# Patient Record
Sex: Male | Born: 1935 | Race: Black or African American | Hispanic: No | Marital: Married | Smoking: Former smoker
Health system: Southern US, Community
[De-identification: ages and names within clinical notes are randomized; demographics above are authoritative.]

## PROBLEM LIST (undated history)

## (undated) DIAGNOSIS — C259 Malignant neoplasm of pancreas, unspecified: Secondary | ICD-10-CM

## (undated) DIAGNOSIS — I10 Essential (primary) hypertension: Secondary | ICD-10-CM

## (undated) DIAGNOSIS — R6883 Chills (without fever): Secondary | ICD-10-CM

## (undated) DIAGNOSIS — A630 Anogenital (venereal) warts: Secondary | ICD-10-CM

## (undated) DIAGNOSIS — I429 Cardiomyopathy, unspecified: Secondary | ICD-10-CM

## (undated) DIAGNOSIS — I82409 Acute embolism and thrombosis of unspecified deep veins of unspecified lower extremity: Secondary | ICD-10-CM

## (undated) DIAGNOSIS — D689 Coagulation defect, unspecified: Secondary | ICD-10-CM

## (undated) DIAGNOSIS — D045 Carcinoma in situ of skin of trunk: Secondary | ICD-10-CM

## (undated) DIAGNOSIS — I441 Atrioventricular block, second degree: Secondary | ICD-10-CM

## (undated) DIAGNOSIS — M7989 Other specified soft tissue disorders: Secondary | ICD-10-CM

## (undated) DIAGNOSIS — IMO0002 Reserved for concepts with insufficient information to code with codable children: Secondary | ICD-10-CM

## (undated) HISTORY — DX: Essential (primary) hypertension: I10

## (undated) HISTORY — DX: Chills (without fever): R68.83

## (undated) HISTORY — PX: HERNIA REPAIR: SHX51

## (undated) HISTORY — DX: Atrioventricular block, second degree: I44.1

## (undated) HISTORY — DX: Cardiomyopathy, unspecified: I42.9

## (undated) HISTORY — DX: Anogenital (venereal) warts: A63.0

## (undated) HISTORY — DX: Reserved for concepts with insufficient information to code with codable children: IMO0002

## (undated) HISTORY — DX: Carcinoma in situ of skin of trunk: D04.5

## (undated) HISTORY — DX: Coagulation defect, unspecified: D68.9

## (undated) HISTORY — DX: Acute embolism and thrombosis of unspecified deep veins of unspecified lower extremity: I82.409

## (undated) HISTORY — DX: Other specified soft tissue disorders: M79.89

## (undated) HISTORY — DX: Malignant neoplasm of pancreas, unspecified: C25.9

## (undated) HISTORY — PX: LAPAROSCOPIC GASTROTOMY W/ REPAIR OF ULCER: SUR772

---

## 2001-01-12 ENCOUNTER — Encounter: Payer: Self-pay | Admitting: Endocrinology

## 2001-01-12 ENCOUNTER — Ambulatory Visit (HOSPITAL_COMMUNITY): Admission: RE | Admit: 2001-01-12 | Discharge: 2001-01-12 | Payer: Self-pay | Admitting: Endocrinology

## 2001-01-15 ENCOUNTER — Encounter: Admission: RE | Admit: 2001-01-15 | Discharge: 2001-04-15 | Payer: Self-pay | Admitting: Endocrinology

## 2001-02-27 ENCOUNTER — Inpatient Hospital Stay (HOSPITAL_COMMUNITY): Admission: EM | Admit: 2001-02-27 | Discharge: 2001-03-03 | Payer: Self-pay | Admitting: Emergency Medicine

## 2001-02-27 ENCOUNTER — Encounter: Payer: Self-pay | Admitting: *Deleted

## 2001-02-27 ENCOUNTER — Encounter: Payer: Self-pay | Admitting: Internal Medicine

## 2001-02-28 ENCOUNTER — Encounter: Payer: Self-pay | Admitting: Endocrinology

## 2001-04-10 ENCOUNTER — Observation Stay (HOSPITAL_COMMUNITY): Admission: RE | Admit: 2001-04-10 | Discharge: 2001-04-11 | Payer: Self-pay | Admitting: *Deleted

## 2001-09-22 ENCOUNTER — Inpatient Hospital Stay (HOSPITAL_COMMUNITY): Admission: EM | Admit: 2001-09-22 | Discharge: 2001-10-02 | Payer: Self-pay | Admitting: Emergency Medicine

## 2001-09-22 ENCOUNTER — Encounter: Payer: Self-pay | Admitting: Emergency Medicine

## 2001-09-29 ENCOUNTER — Encounter: Payer: Self-pay | Admitting: Surgery

## 2004-02-02 ENCOUNTER — Ambulatory Visit (HOSPITAL_COMMUNITY): Admission: RE | Admit: 2004-02-02 | Discharge: 2004-02-02 | Payer: Self-pay | Admitting: Endocrinology

## 2005-01-24 ENCOUNTER — Ambulatory Visit (HOSPITAL_COMMUNITY): Admission: RE | Admit: 2005-01-24 | Discharge: 2005-01-24 | Payer: Self-pay | Admitting: Endocrinology

## 2006-01-25 ENCOUNTER — Encounter: Admission: RE | Admit: 2006-01-25 | Discharge: 2006-01-25 | Payer: Self-pay | Admitting: Endocrinology

## 2006-02-08 ENCOUNTER — Ambulatory Visit (HOSPITAL_COMMUNITY): Admission: RE | Admit: 2006-02-08 | Discharge: 2006-02-08 | Payer: Self-pay | Admitting: *Deleted

## 2006-02-08 ENCOUNTER — Encounter (INDEPENDENT_AMBULATORY_CARE_PROVIDER_SITE_OTHER): Payer: Self-pay | Admitting: *Deleted

## 2006-02-10 ENCOUNTER — Encounter (INDEPENDENT_AMBULATORY_CARE_PROVIDER_SITE_OTHER): Payer: Self-pay | Admitting: *Deleted

## 2006-02-10 ENCOUNTER — Ambulatory Visit (HOSPITAL_COMMUNITY): Admission: RE | Admit: 2006-02-10 | Discharge: 2006-02-10 | Payer: Self-pay | Admitting: Endocrinology

## 2006-03-07 ENCOUNTER — Encounter: Admission: RE | Admit: 2006-03-07 | Discharge: 2006-03-07 | Payer: Self-pay

## 2008-01-29 ENCOUNTER — Encounter: Admission: RE | Admit: 2008-01-29 | Discharge: 2008-01-29 | Payer: Self-pay | Admitting: Endocrinology

## 2009-01-28 ENCOUNTER — Encounter: Admission: RE | Admit: 2009-01-28 | Discharge: 2009-01-28 | Payer: Self-pay | Admitting: Endocrinology

## 2009-02-19 ENCOUNTER — Ambulatory Visit (HOSPITAL_COMMUNITY): Admission: RE | Admit: 2009-02-19 | Discharge: 2009-02-19 | Payer: Self-pay | Admitting: *Deleted

## 2009-02-19 ENCOUNTER — Encounter (INDEPENDENT_AMBULATORY_CARE_PROVIDER_SITE_OTHER): Payer: Self-pay | Admitting: *Deleted

## 2009-05-06 ENCOUNTER — Encounter: Admission: RE | Admit: 2009-05-06 | Discharge: 2009-05-06 | Payer: Self-pay | Admitting: Orthopedic Surgery

## 2009-06-02 ENCOUNTER — Encounter: Admission: RE | Admit: 2009-06-02 | Discharge: 2009-06-02 | Payer: Self-pay | Admitting: Endocrinology

## 2009-06-23 HISTORY — PX: BACK SURGERY: SHX140

## 2009-07-01 ENCOUNTER — Inpatient Hospital Stay (HOSPITAL_COMMUNITY): Admission: RE | Admit: 2009-07-01 | Discharge: 2009-07-13 | Payer: Self-pay | Admitting: Orthopedic Surgery

## 2009-07-01 ENCOUNTER — Ambulatory Visit: Payer: Self-pay | Admitting: Pulmonary Disease

## 2009-07-06 ENCOUNTER — Encounter (INDEPENDENT_AMBULATORY_CARE_PROVIDER_SITE_OTHER): Payer: Self-pay | Admitting: Orthopedic Surgery

## 2009-07-06 ENCOUNTER — Ambulatory Visit: Payer: Self-pay | Admitting: Vascular Surgery

## 2009-07-11 ENCOUNTER — Ambulatory Visit: Payer: Self-pay | Admitting: Physical Medicine & Rehabilitation

## 2009-07-21 ENCOUNTER — Inpatient Hospital Stay (HOSPITAL_COMMUNITY): Admission: EM | Admit: 2009-07-21 | Discharge: 2009-07-23 | Payer: Self-pay | Admitting: Emergency Medicine

## 2010-01-06 ENCOUNTER — Encounter: Admission: RE | Admit: 2010-01-06 | Discharge: 2010-02-26 | Payer: Self-pay | Admitting: Rheumatology

## 2010-08-20 IMAGING — US US SOFT TISSUE HEAD/NECK
1 series · 14 of 21 positions shown · non-contrast
Comparison: 01/29/2008

CLINICAL DATA: Follow-up thyroid nodule.

THYROID ULTRASOUND
TECHNIQUE: Ultrasound examination of the thyroid gland and
adjacent soft tissues was performed.

[Series 1: us soft tissue head/neck · 0.08mm/px · 14 of 21 slices shown]
[im 1/21]
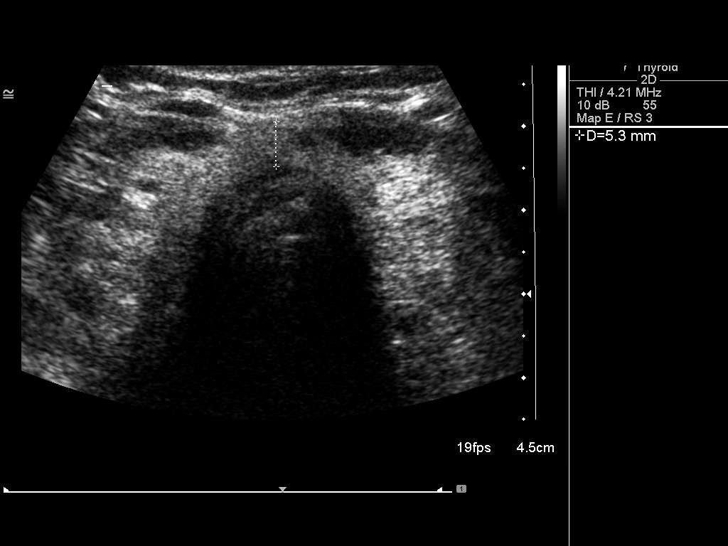
[im 3/21]
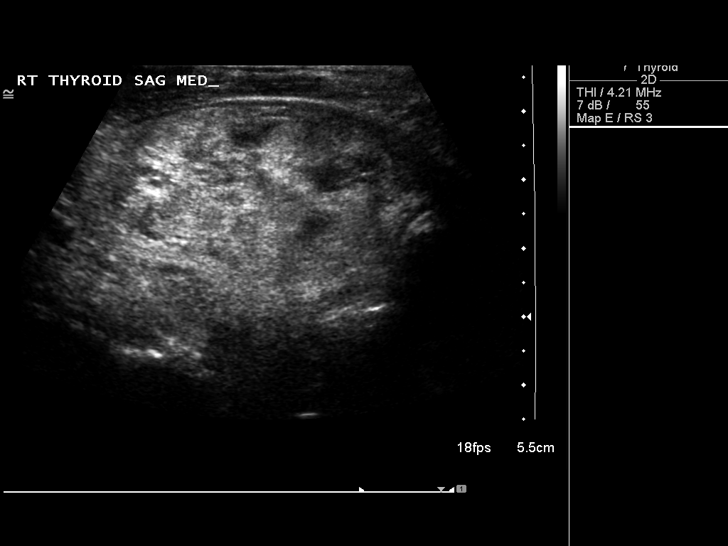
[im 4/21]
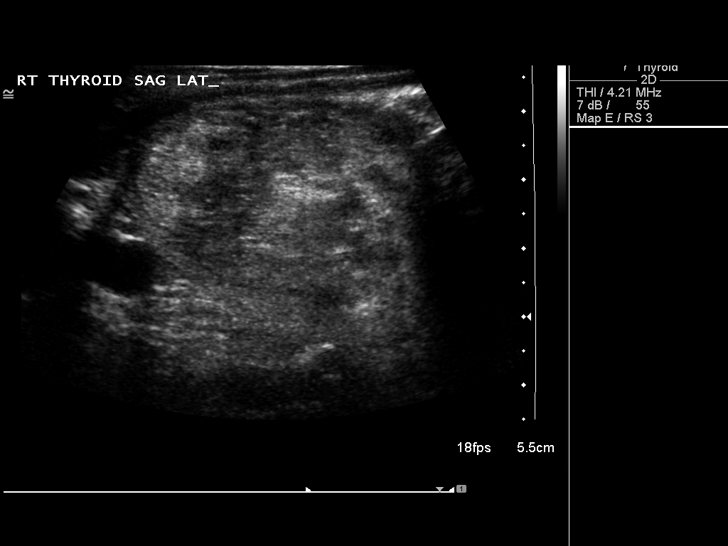
[im 6/21]
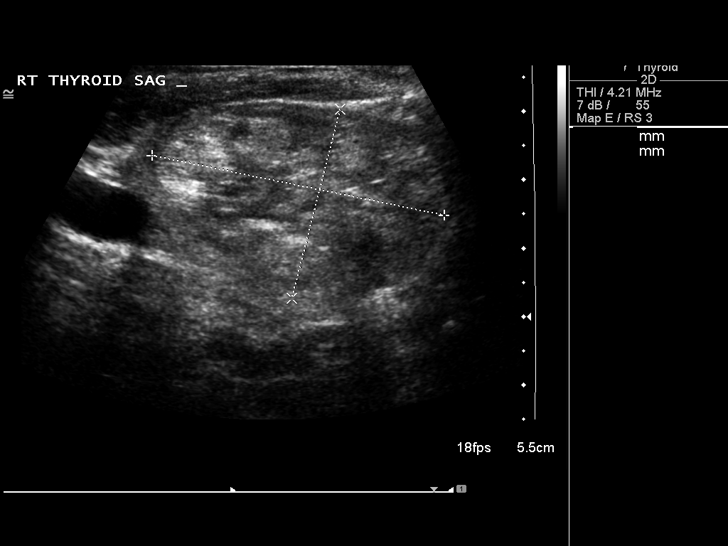
[im 7/21]
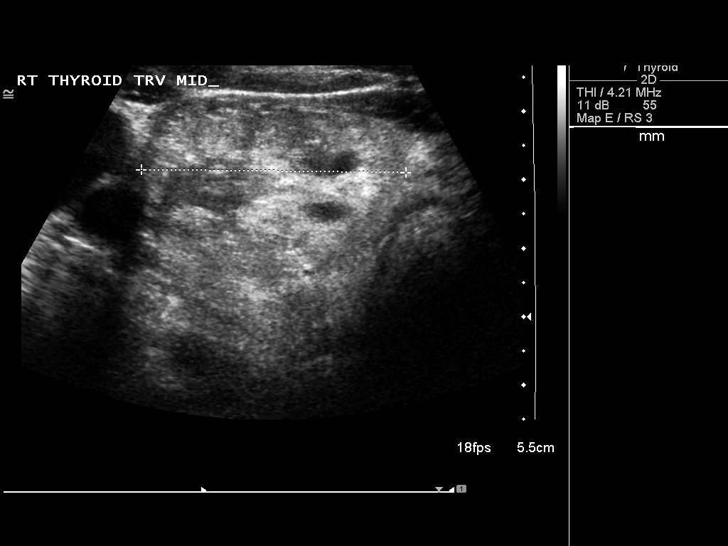
[im 9/21]
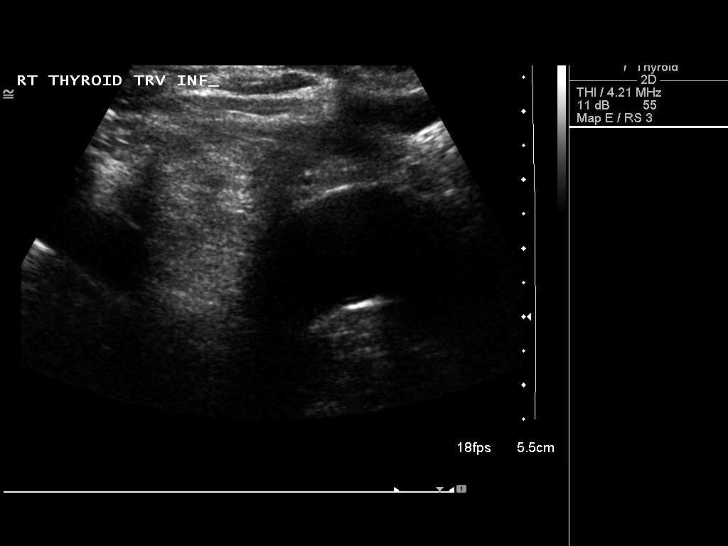
[im 10/21]
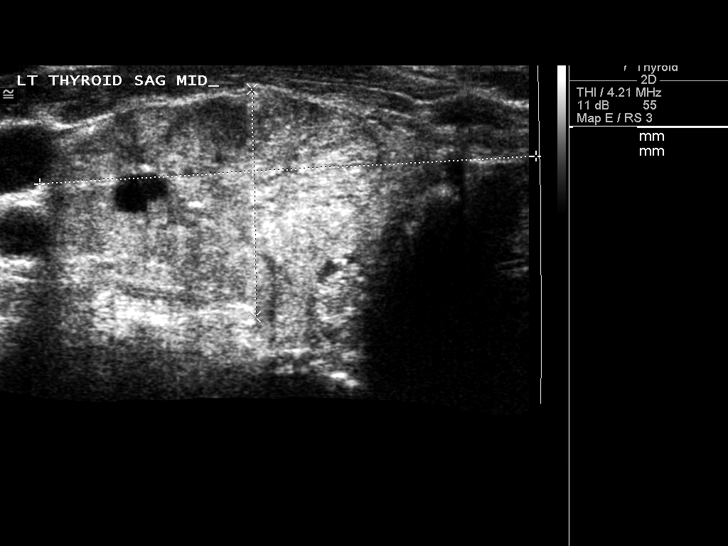
[im 12/21]
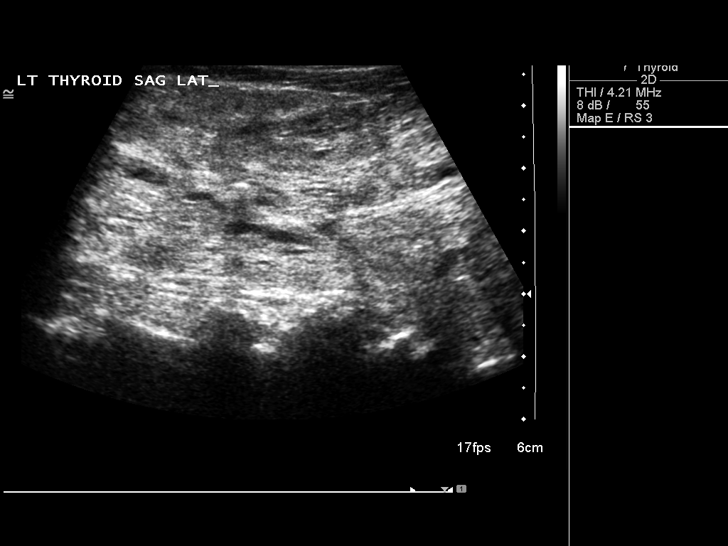
[im 13/21]
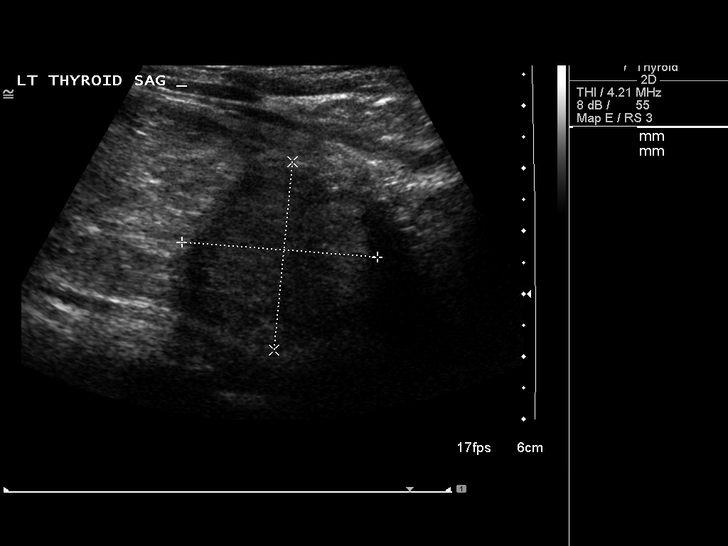
[im 15/21]
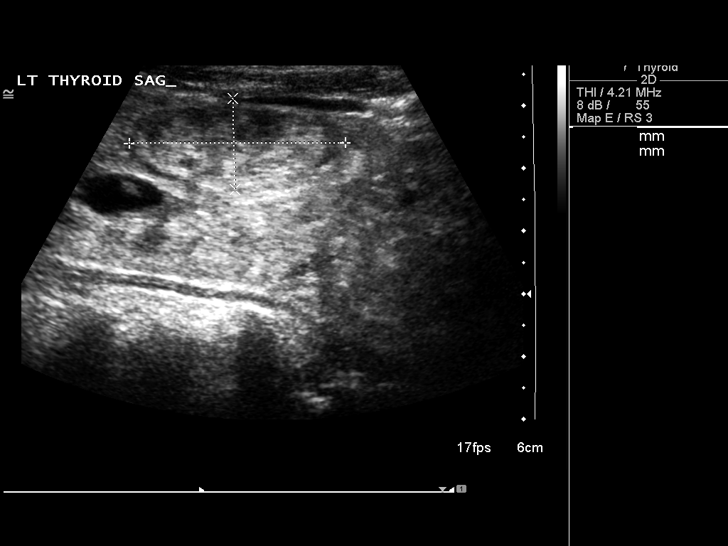
[im 16/21]
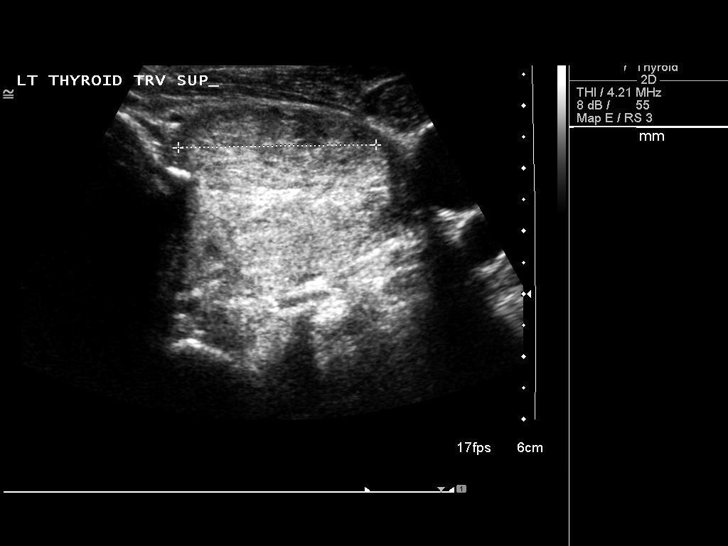
[im 18/21]
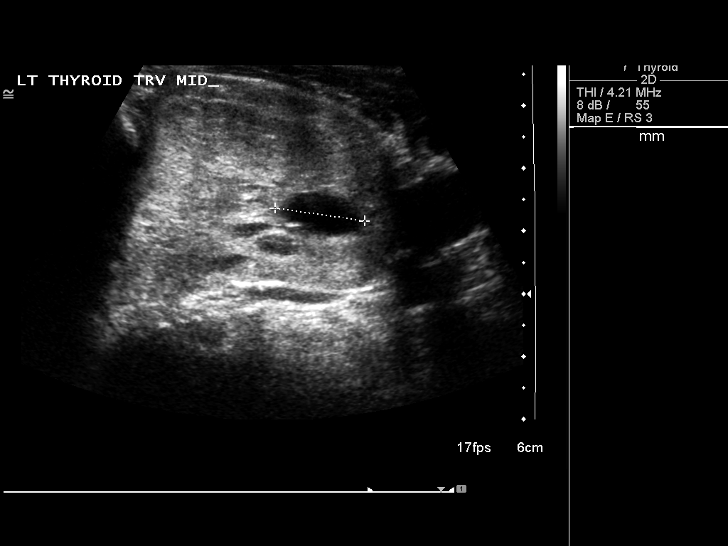
[im 19/21]
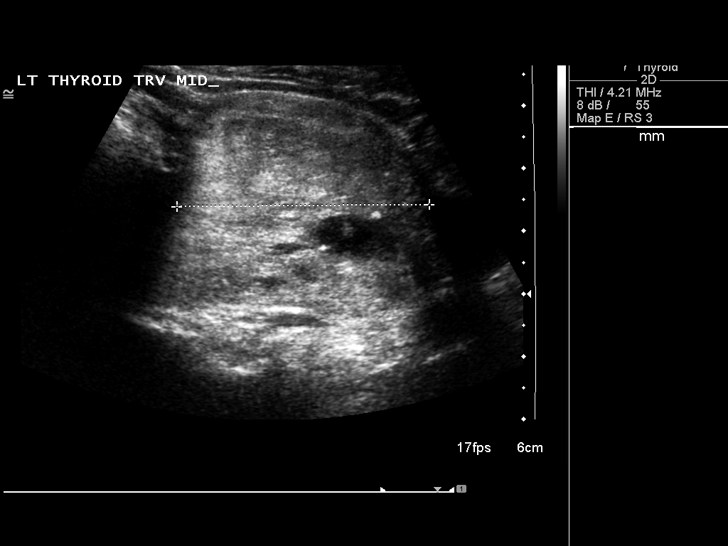
[im 21/21]
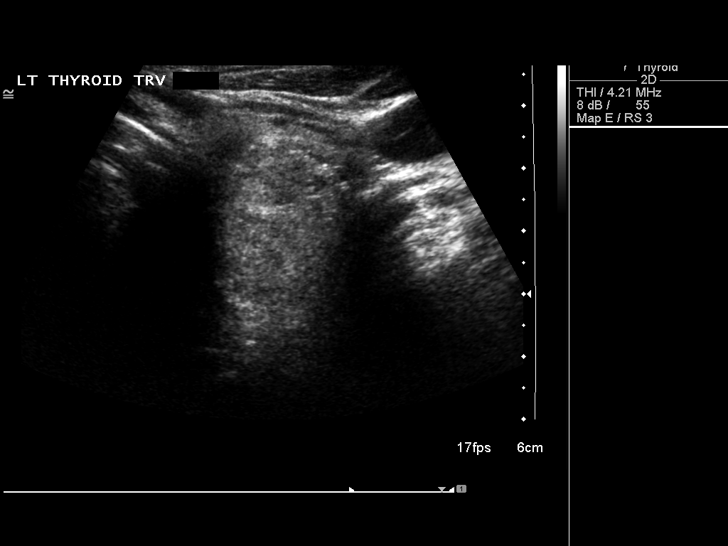

[14 of 21 positions shown; findings below may reference images not displayed]

FINDINGS: Right thyroid lobe measures 6.2 of 4.0 x 3.9 cm.  Left
thyroid lobe measures 7.6 x 3.5 x 4.0 cm.  These dimensions are
smaller than on prior study.  Isthmus is 5 mm.

Dominant solitary solid nodule seen on the right, measuring 4.4 x
2.9 x 3.9 cm, stable or slightly smaller in size.  Three nodules
noted on the left. Upper pole nodule measures 3.5 cm maximally,
compared 4 cm previously.  Mid pole cystic nodule measures 1.5 cm
maximally, compared to 1.5 cm previously.  Lower pole solid nodule
measures 3.1 cm maximally, compared to 3.3 cm previously.
IMPRESSION: The overall gland size has decreased since prior study.  Bilateral
thyroid nodules are stable or slightly smaller since prior study.

## 2010-10-28 ENCOUNTER — Observation Stay (HOSPITAL_COMMUNITY)
Admission: EM | Admit: 2010-10-28 | Discharge: 2010-10-29 | Payer: Self-pay | Source: Home / Self Care | Attending: Internal Medicine | Admitting: Internal Medicine

## 2011-01-21 IMAGING — CR DG LUMBAR SPINE 2-3V
1 series · 1 of 1 positions shown · non-contrast
Comparison: 06/26/2009 and earlier.

CLINICAL DATA: 85-year-old male undergoing lumbar surgery.

LUMBAR SPINE - 2-3 VIEW

[view not recorded]
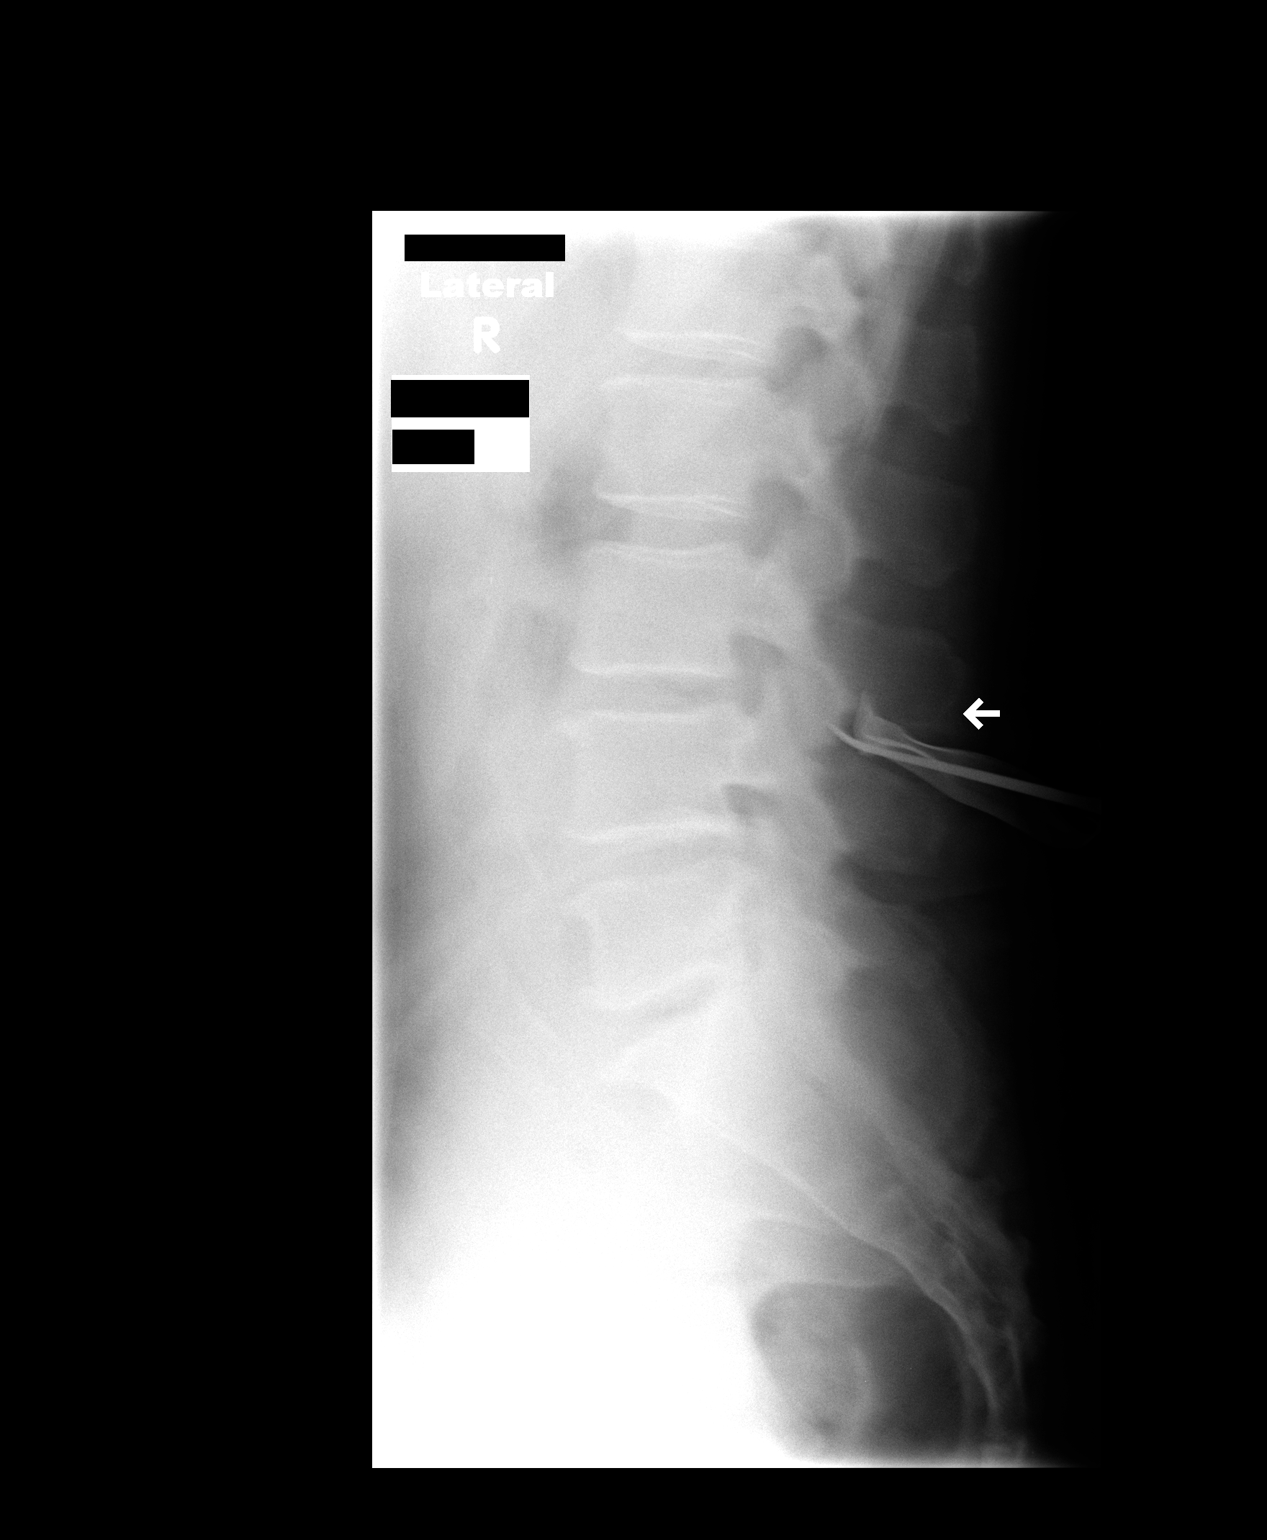

[1 of 1 positions shown; findings below may reference images not displayed]

FINDINGS: Portable cross-table lateral intraoperative view of the
lumbar spine labeled 4944 hours and 1014 hours on 07/01/2009.

On the film labeled 4944 hours there is a surgical probe at the L3-
L4 disc space level, projecting over the L3 inferior articulating
facet.

On the film labeled 1014 hours there is a surgical probe again
adjacent to the L3 inferior articulating facet near the L4 pedicle
level.  Surgical clamps are present in the interspinous space at L2-
L3 and L4-L5.
IMPRESSION: Intraoperative localization as above.

## 2011-01-24 LAB — CK TOTAL AND CKMB (NOT AT ARMC)
CK, MB: 1.9 ng/mL (ref 0.3–4.0)
Relative Index: INVALID (ref 0.0–2.5)
Total CK: 94 U/L (ref 7–232)

## 2011-01-24 LAB — BASIC METABOLIC PANEL
BUN: 20 mg/dL (ref 6–23)
CO2: 28 mEq/L (ref 19–32)
Calcium: 9 mg/dL (ref 8.4–10.5)
Chloride: 109 mEq/L (ref 96–112)
Creatinine, Ser: 1.03 mg/dL (ref 0.4–1.5)
GFR calc Af Amer: >60 mL/min (ref >60)
GFR calc non Af Amer: >60 mL/min (ref >60)
Glucose, Bld: 93 mg/dL (ref 70–99)
Potassium: 4.2 mEq/L (ref 3.5–5.1)
Sodium: 143 mEq/L (ref 135–145)

## 2011-01-24 LAB — CBC
HCT: 37.7 % — ABNORMAL LOW (ref 39.0–52.0)
Hemoglobin: 13.2 g/dL (ref 13.0–17.0)
MCH: 30.7 pg (ref 26.0–34.0)
MCHC: 35 g/dL (ref 30.0–36.0)
MCV: 87.7 fL (ref 78.0–100.0)
Platelets: 153 10*3/uL (ref 150–400)
RBC: 4.3 MIL/uL (ref 4.22–5.81)
RDW: 15.6 % — ABNORMAL HIGH (ref 11.5–15.5)
WBC: 7.3 10*3/uL (ref 4.0–10.5)

## 2011-01-24 LAB — LUPUS ANTICOAGULANT PANEL
DRVVT: 37 secs (ref 36.2–44.3)
Lupus Anticoagulant: NOT DETECTED
PTT Lupus Anticoagulant: 36.4 secs (ref 30.0–45.6)

## 2011-01-24 LAB — DIFFERENTIAL
Basophils Absolute: 0 10*3/uL (ref 0.0–0.1)
Basophils Relative: 0 % (ref 0–1)
Eosinophils Absolute: 0.1 10*3/uL (ref 0.0–0.7)
Eosinophils Relative: 1 % (ref 0–5)
Lymphocytes Relative: 26 % (ref 12–46)
Lymphs Abs: 1.9 10*3/uL (ref 0.7–4.0)
Monocytes Absolute: 0.9 10*3/uL (ref 0.1–1.0)
Monocytes Relative: 12 % (ref 3–12)
Neutro Abs: 4.4 10*3/uL (ref 1.7–7.7)
Neutrophils Relative %: 61 % (ref 43–77)

## 2011-01-24 LAB — BETA-2-GLYCOPROTEIN I ABS, IGG/M/A
Beta-2 Glyco I IgG: 0 G Units (ref <20)
Beta-2-Glycoprotein I IgA: 7 A Units (ref <20)
Beta-2-Glycoprotein I IgM: 5 M Units (ref <20)

## 2011-01-24 LAB — CARDIAC PANEL(CRET KIN+CKTOT+MB+TROPI)
CK, MB: 1.5 ng/mL (ref 0.3–4.0)
CK, MB: 1.6 ng/mL (ref 0.3–4.0)
Relative Index: INVALID (ref 0.0–2.5)
Relative Index: INVALID (ref 0.0–2.5)
Total CK: 72 U/L (ref 7–232)
Total CK: 77 U/L (ref 7–232)
Troponin I: 0.01 ng/mL (ref 0.00–0.06)
Troponin I: 0.01 ng/mL (ref 0.00–0.06)

## 2011-01-24 LAB — CARDIOLIPIN ANTIBODIES, IGG, IGM, IGA
Anticardiolipin IgA: 3 APL U/mL — ABNORMAL LOW (ref <22)
Anticardiolipin IgG: 2 GPL U/mL — ABNORMAL LOW (ref <23)
Anticardiolipin IgM: 3 MPL U/mL — ABNORMAL LOW (ref <11)

## 2011-01-24 LAB — PROTIME-INR
INR: 1.09 (ref 0.00–1.49)
INR: 1.14 (ref 0.00–1.49)
Prothrombin Time: 14.3 seconds (ref 11.6–15.2)
Prothrombin Time: 14.8 seconds (ref 11.6–15.2)

## 2011-01-24 LAB — PROTEIN C ACTIVITY: Protein C Activity: 113 % (ref 75–133)

## 2011-01-24 LAB — D-DIMER, QUANTITATIVE: D-Dimer, Quant: 2.31 ug/mL-FEU — ABNORMAL HIGH (ref 0.00–0.48)

## 2011-01-24 LAB — PROTEIN S ACTIVITY: Protein S Activity: 52 % — ABNORMAL LOW (ref 69–129)

## 2011-01-24 LAB — HOMOCYSTEINE: Homocysteine: 10.6 umol/L (ref 4.0–15.4)

## 2011-01-24 LAB — PROTEIN C, TOTAL: Protein C, Total: 103 % (ref 70–140)

## 2011-01-24 LAB — ANTITHROMBIN III: AntiThromb III Func: 77 % (ref 76–126)

## 2011-01-24 LAB — TROPONIN I: Troponin I: 0.02 ng/mL (ref 0.00–0.06)

## 2011-01-24 LAB — PROTEIN S, TOTAL: Protein S Ag, Total: 80 % (ref 70–140)

## 2011-01-24 LAB — FACTOR 5 LEIDEN

## 2011-01-24 LAB — PROTHROMBIN GENE MUTATION

## 2011-01-24 LAB — TSH: TSH: 0.064 u[IU]/mL — ABNORMAL LOW (ref 0.350–4.500)

## 2011-01-24 IMAGING — CR DG CHEST 2V
1 series · 1 of 1 positions shown · non-contrast
Comparison: 07/01/2009 and earlier.

CLINICAL DATA: 85-year-old male with spinal stenosis and cough.
Question pleural effusion.

CHEST - 2 VIEW

[view not recorded]
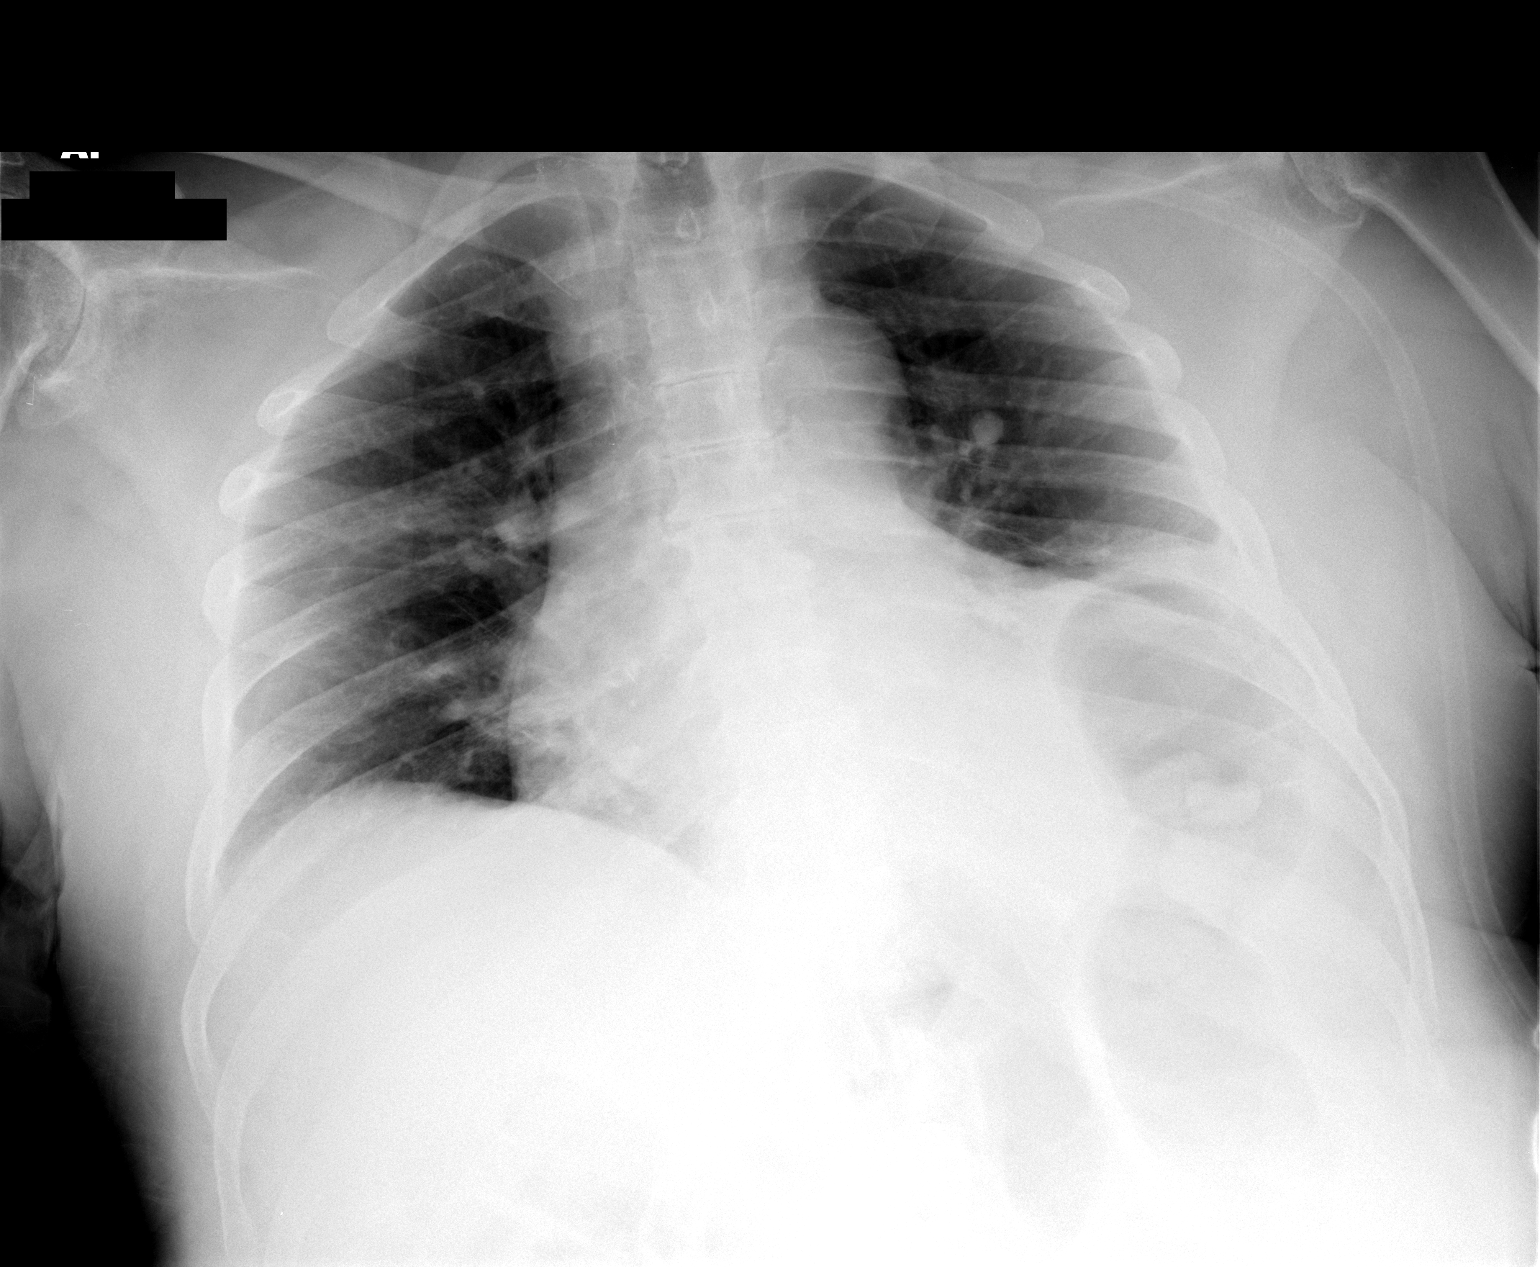

[1 of 1 positions shown; findings below may reference images not displayed]

FINDINGS: Semi upright AP and lateral views of the chest.  Total
collapse of the lingula.  Elevation of left hemidiaphragm.  No
definite left pleural effusion.  Overall low lung volumes with
atelectasis also at the right base. Stable cardiomegaly and
mediastinal contours.  No pneumothorax or pulmonary edema.
IMPRESSION: No strong evidence of pleural effusion.  Left lingula collapse and
lesser superimposed bibasilar atelectasis.

## 2011-01-24 IMAGING — US US RENAL
1 series · 14 of 25 positions shown · non-contrast
Comparison: Abdominal ultrasound performed 06/02/2009

CLINICAL DATA: Rule out hydronephrosis

RENAL/URINARY TRACT ULTRASOUND COMPLETE

[Series 1: us renal · 0.35mm/px · 14 of 36 slices shown]
[im 1/36]
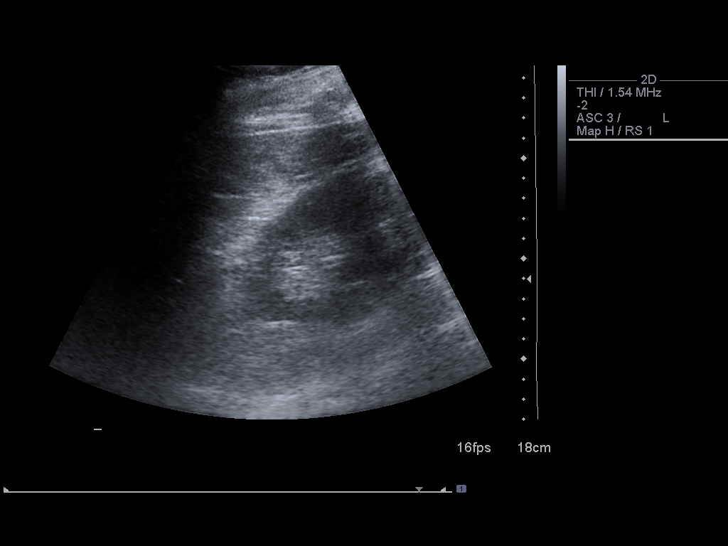
[im 3/36]
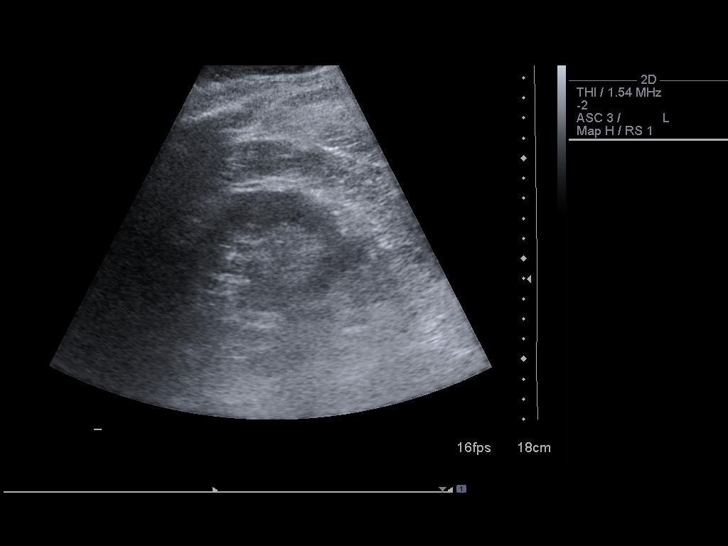
[im 6/36]
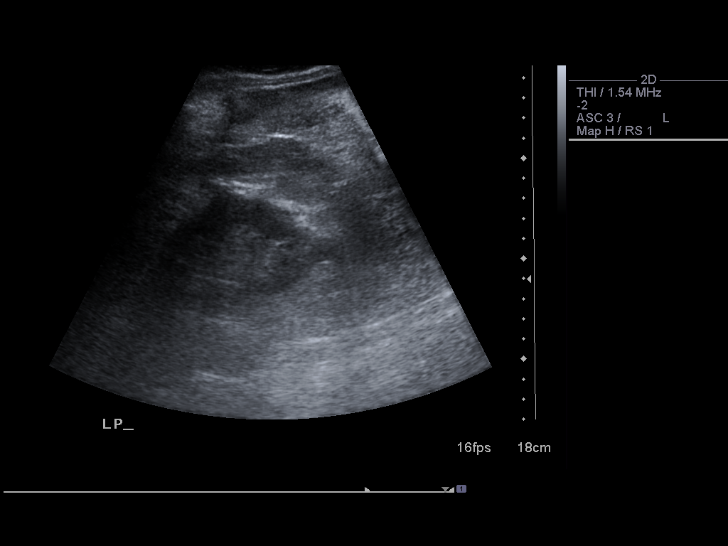
[im 9/36]
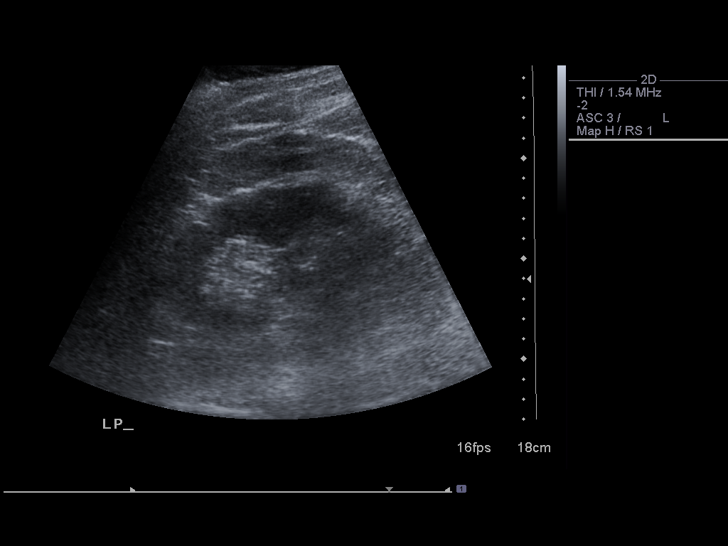
[im 12/36]
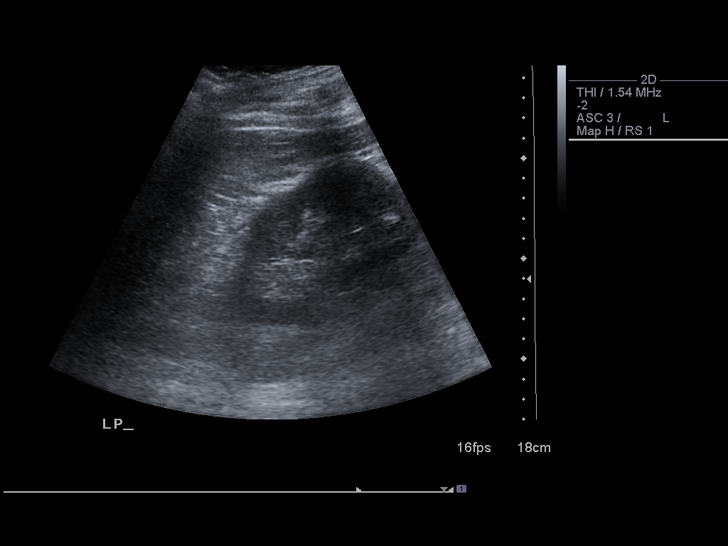
[im 14/36]
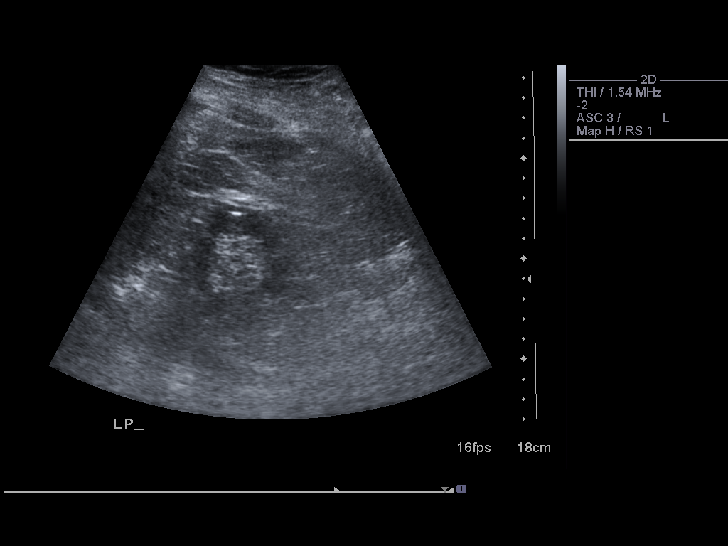
[im 17/36]
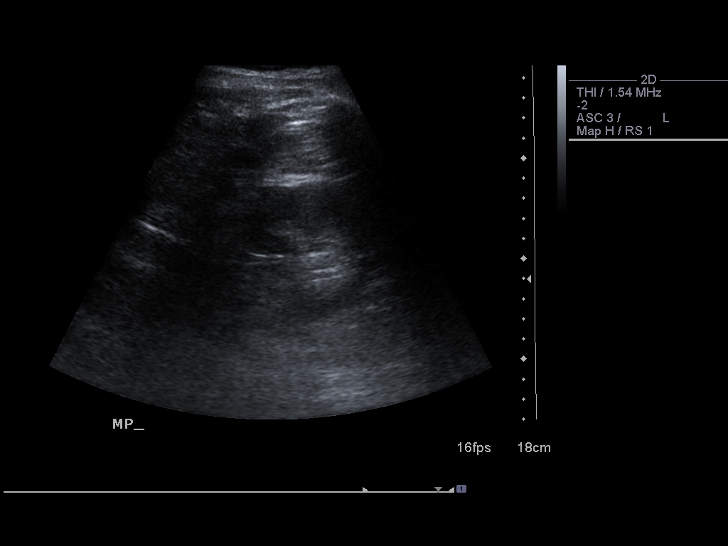
[im 19/36]
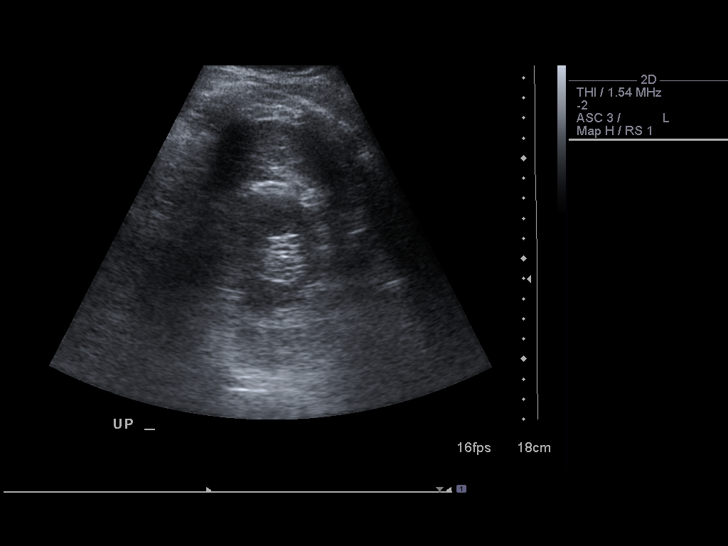
[im 22/36]
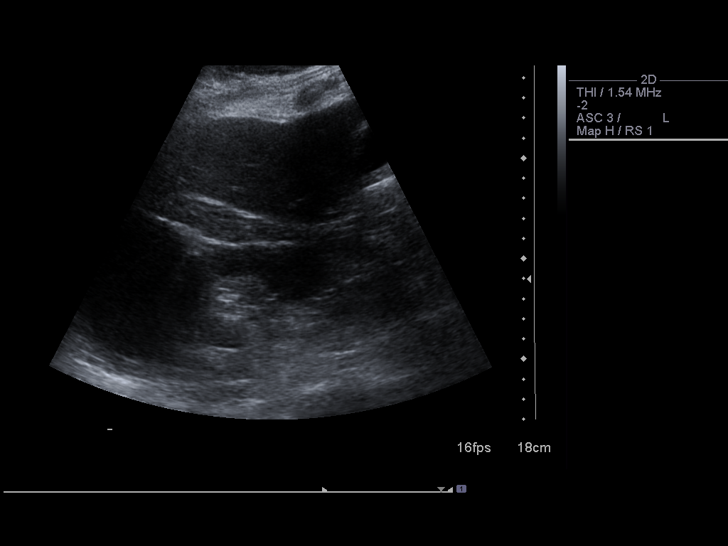
[im 24/36]
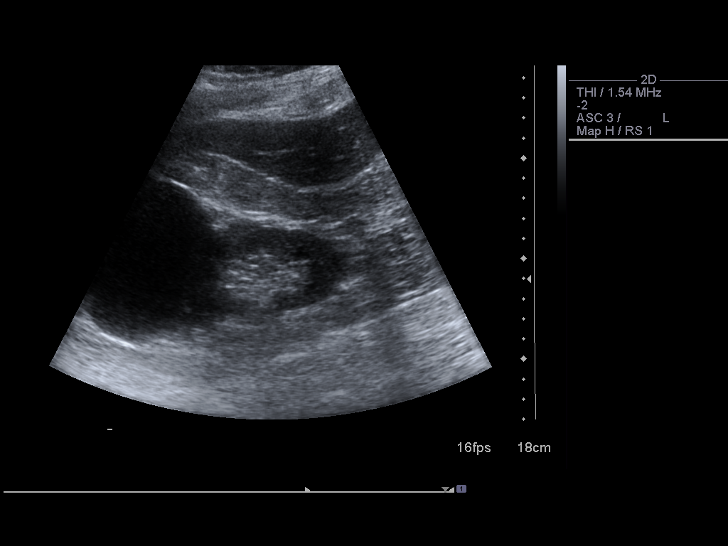
[im 27/36]
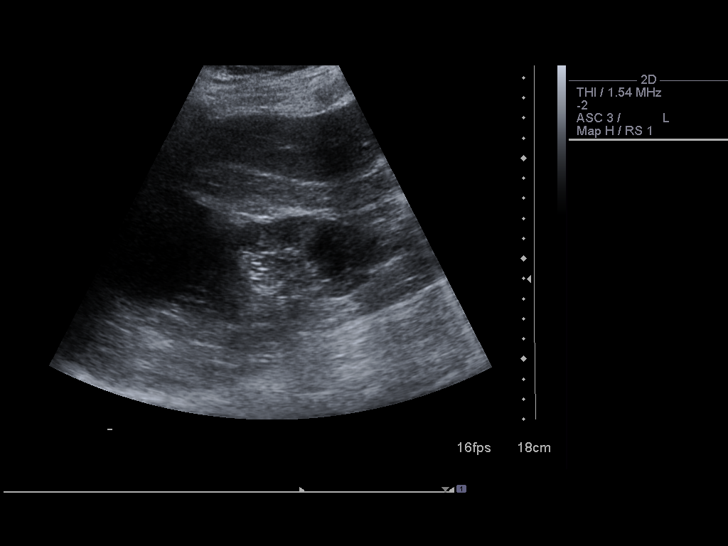
[im 30/36]
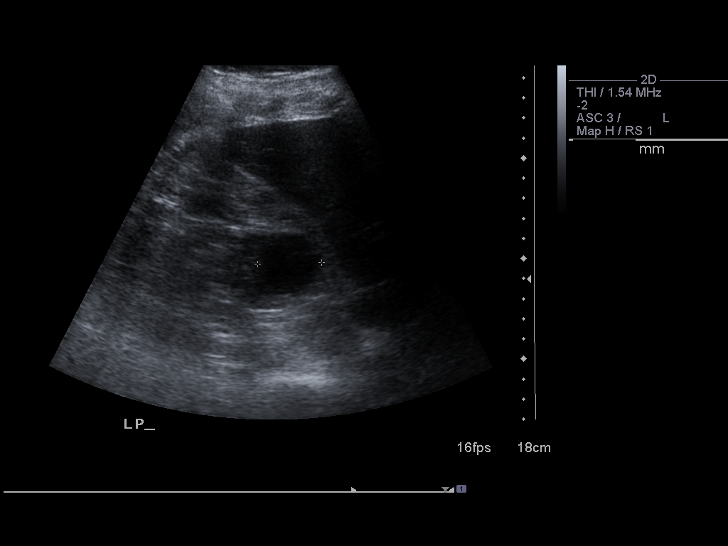
[im 33/36]
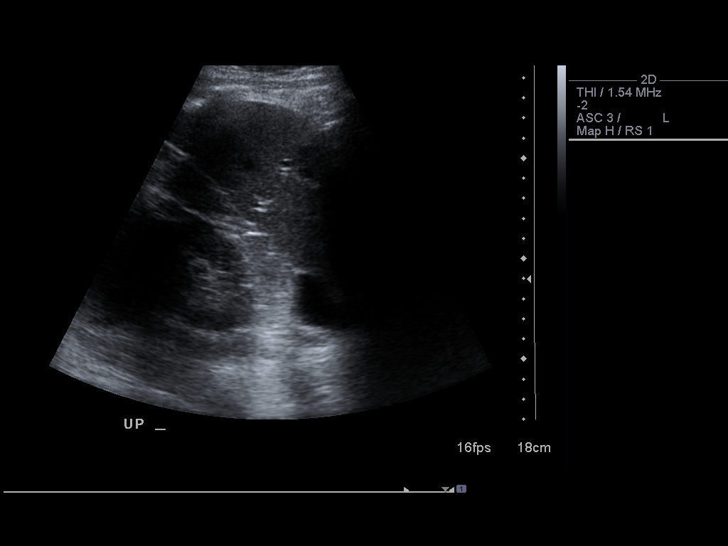
[im 36/36]
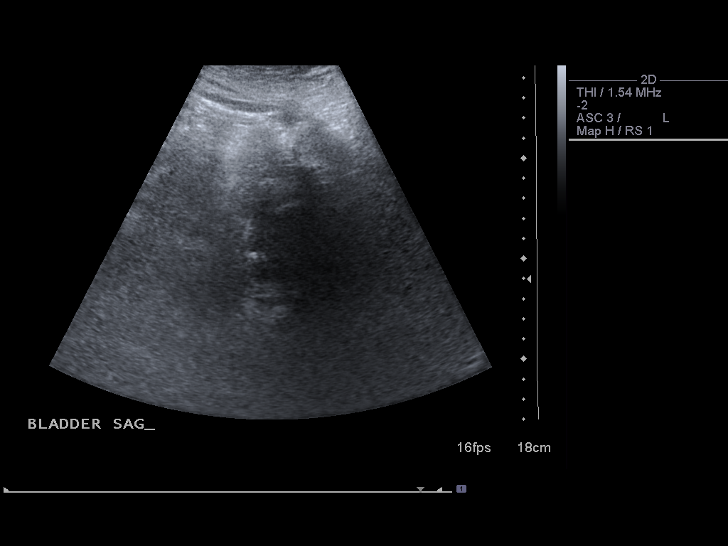

[14 of 25 positions shown; findings below may reference images not displayed]

FINDINGS: Right Kidney:  The right kidney measures 10.2 cm in length.  The
kidney demonstrates normal size, echogenicity and configuration.
No significant cortical thinning is seen.  No hydronephrosis or
calcification is identified.  Two renal cysts are identified; the
larger cyst is noted at the upper pole, measuring 8.4 x 6.6 x
cm, while a smaller cyst is noted at the lower pole, measuring
x 3.2 x 3.1 cm.

Left Kidney:  The left kidney measures 12.0 cm in length.  The
kidney demonstrates normal size, echogenicity and configuration.
No significant cortical thinning is seen.  No hydronephrosis or
calcification is identified.  A small cyst is identified at the
lower pole of the left kidney, measuring 1.9 x 1.8 cm.

Bladder:  The bladder is decompressed; a Foley catheter is
partially visualized.
IMPRESSION: No evidence of hydronephrosis; bilateral renal cysts noted,
particularly on the right side.

## 2011-02-18 LAB — CBC
HCT: 30.2 % — ABNORMAL LOW (ref 39.0–52.0)
HCT: 33.2 % — ABNORMAL LOW (ref 39.0–52.0)
Hemoglobin: 10.1 g/dL — ABNORMAL LOW (ref 13.0–17.0)
Hemoglobin: 10.7 g/dL — ABNORMAL LOW (ref 13.0–17.0)
MCHC: 32.2 g/dL (ref 30.0–36.0)
MCHC: 33.4 g/dL (ref 30.0–36.0)
MCV: 92.3 fL (ref 78.0–100.0)
MCV: 92.7 fL (ref 78.0–100.0)
Platelets: 209 10*3/uL (ref 150–400)
Platelets: 229 10*3/uL (ref 150–400)
RBC: 3.27 MIL/uL — ABNORMAL LOW (ref 4.22–5.81)
RBC: 3.58 MIL/uL — ABNORMAL LOW (ref 4.22–5.81)
RDW: 15.9 % — ABNORMAL HIGH (ref 11.5–15.5)
RDW: 16.1 % — ABNORMAL HIGH (ref 11.5–15.5)
WBC: 5.7 10*3/uL (ref 4.0–10.5)
WBC: 6.3 10*3/uL (ref 4.0–10.5)

## 2011-02-18 LAB — BASIC METABOLIC PANEL
BUN: 13 mg/dL (ref 6–23)
BUN: 17 mg/dL (ref 6–23)
CO2: 28 mEq/L (ref 19–32)
CO2: 28 mEq/L (ref 19–32)
Calcium: 8.5 mg/dL (ref 8.4–10.5)
Calcium: 8.7 mg/dL (ref 8.4–10.5)
Chloride: 105 mEq/L (ref 96–112)
Chloride: 107 mEq/L (ref 96–112)
Creatinine, Ser: 1.04 mg/dL (ref 0.4–1.5)
Creatinine, Ser: 1.05 mg/dL (ref 0.4–1.5)
GFR calc Af Amer: >60 mL/min (ref >60)
GFR calc Af Amer: >60 mL/min (ref >60)
GFR calc non Af Amer: >60 mL/min (ref >60)
GFR calc non Af Amer: >60 mL/min (ref >60)
Glucose, Bld: 108 mg/dL — ABNORMAL HIGH (ref 70–99)
Glucose, Bld: 120 mg/dL — ABNORMAL HIGH (ref 70–99)
Potassium: 3.7 mEq/L (ref 3.5–5.1)
Potassium: 4 mEq/L (ref 3.5–5.1)
Sodium: 140 mEq/L (ref 135–145)
Sodium: 140 mEq/L (ref 135–145)

## 2011-02-18 LAB — PROTIME-INR
INR: 2.2 — ABNORMAL HIGH (ref 0.00–1.49)
INR: 2.2 — ABNORMAL HIGH (ref 0.00–1.49)
INR: 2.2 — ABNORMAL HIGH (ref 0.00–1.49)
Prothrombin Time: 24.3 seconds — ABNORMAL HIGH (ref 11.6–15.2)
Prothrombin Time: 24.4 seconds — ABNORMAL HIGH (ref 11.6–15.2)
Prothrombin Time: 24.5 seconds — ABNORMAL HIGH (ref 11.6–15.2)

## 2011-02-18 LAB — URINALYSIS, ROUTINE W REFLEX MICROSCOPIC
Bilirubin Urine: NEGATIVE
Glucose, UA: NEGATIVE mg/dL
Ketones, ur: NEGATIVE mg/dL
Leukocytes, UA: NEGATIVE
Nitrite: NEGATIVE
Protein, ur: NEGATIVE mg/dL
Specific Gravity, Urine: 1.02 (ref 1.005–1.030)
Urobilinogen, UA: 0.2 mg/dL (ref 0.0–1.0)
pH: 6 (ref 5.0–8.0)

## 2011-02-18 LAB — URINE CULTURE
Colony Count: NO GROWTH
Culture: NO GROWTH

## 2011-02-18 LAB — POCT CARDIAC MARKERS
CKMB, poc: 1.6 ng/mL (ref 1.0–8.0)
Myoglobin, poc: 118 ng/mL (ref 12–200)
Troponin i, poc: <0.05 ng/mL (ref 0.00–0.09)

## 2011-02-18 LAB — DIFFERENTIAL
Basophils Absolute: 0 10*3/uL (ref 0.0–0.1)
Basophils Relative: 0 % (ref 0–1)
Eosinophils Absolute: 0.1 10*3/uL (ref 0.0–0.7)
Eosinophils Relative: 2 % (ref 0–5)
Lymphocytes Relative: 21 % (ref 12–46)
Lymphs Abs: 1.3 10*3/uL (ref 0.7–4.0)
Monocytes Absolute: 0.5 10*3/uL (ref 0.1–1.0)
Monocytes Relative: 9 % (ref 3–12)
Neutro Abs: 4.3 10*3/uL (ref 1.7–7.7)
Neutrophils Relative %: 69 % (ref 43–77)

## 2011-02-18 LAB — CARDIAC PANEL(CRET KIN+CKTOT+MB+TROPI)
CK, MB: 1.3 ng/mL (ref 0.3–4.0)
CK, MB: 1.5 ng/mL (ref 0.3–4.0)
Relative Index: INVALID (ref 0.0–2.5)
Relative Index: INVALID (ref 0.0–2.5)
Total CK: 84 U/L (ref 7–232)
Total CK: 88 U/L (ref 7–232)
Troponin I: 0.01 ng/mL (ref 0.00–0.06)
Troponin I: 0.02 ng/mL (ref 0.00–0.06)

## 2011-02-18 LAB — URINE MICROSCOPIC-ADD ON

## 2011-02-18 LAB — APTT: aPTT: 37 seconds (ref 24–37)

## 2011-02-18 LAB — TROPONIN I: Troponin I: 0.01 ng/mL (ref 0.00–0.06)

## 2011-02-18 LAB — CK TOTAL AND CKMB (NOT AT ARMC)
CK, MB: 1.4 ng/mL (ref 0.3–4.0)
Relative Index: 1.3 (ref 0.0–2.5)
Total CK: 107 U/L (ref 7–232)

## 2011-02-19 LAB — GLUCOSE, CAPILLARY
Glucose-Capillary: 105 mg/dL — ABNORMAL HIGH (ref 70–99)
Glucose-Capillary: 108 mg/dL — ABNORMAL HIGH (ref 70–99)
Glucose-Capillary: 110 mg/dL — ABNORMAL HIGH (ref 70–99)
Glucose-Capillary: 112 mg/dL — ABNORMAL HIGH (ref 70–99)
Glucose-Capillary: 118 mg/dL — ABNORMAL HIGH (ref 70–99)
Glucose-Capillary: 126 mg/dL — ABNORMAL HIGH (ref 70–99)
Glucose-Capillary: 131 mg/dL — ABNORMAL HIGH (ref 70–99)
Glucose-Capillary: 131 mg/dL — ABNORMAL HIGH (ref 70–99)
Glucose-Capillary: 133 mg/dL — ABNORMAL HIGH (ref 70–99)
Glucose-Capillary: 133 mg/dL — ABNORMAL HIGH (ref 70–99)
Glucose-Capillary: 139 mg/dL — ABNORMAL HIGH (ref 70–99)
Glucose-Capillary: 140 mg/dL — ABNORMAL HIGH (ref 70–99)
Glucose-Capillary: 143 mg/dL — ABNORMAL HIGH (ref 70–99)
Glucose-Capillary: 148 mg/dL — ABNORMAL HIGH (ref 70–99)
Glucose-Capillary: 153 mg/dL — ABNORMAL HIGH (ref 70–99)
Glucose-Capillary: 157 mg/dL — ABNORMAL HIGH (ref 70–99)
Glucose-Capillary: 171 mg/dL — ABNORMAL HIGH (ref 70–99)
Glucose-Capillary: 173 mg/dL — ABNORMAL HIGH (ref 70–99)
Glucose-Capillary: 174 mg/dL — ABNORMAL HIGH (ref 70–99)
Glucose-Capillary: 188 mg/dL — ABNORMAL HIGH (ref 70–99)
Glucose-Capillary: 189 mg/dL — ABNORMAL HIGH (ref 70–99)
Glucose-Capillary: 189 mg/dL — ABNORMAL HIGH (ref 70–99)
Glucose-Capillary: 196 mg/dL — ABNORMAL HIGH (ref 70–99)
Glucose-Capillary: 200 mg/dL — ABNORMAL HIGH (ref 70–99)
Glucose-Capillary: 214 mg/dL — ABNORMAL HIGH (ref 70–99)
Glucose-Capillary: 224 mg/dL — ABNORMAL HIGH (ref 70–99)
Glucose-Capillary: 227 mg/dL — ABNORMAL HIGH (ref 70–99)
Glucose-Capillary: 240 mg/dL — ABNORMAL HIGH (ref 70–99)
Glucose-Capillary: 244 mg/dL — ABNORMAL HIGH (ref 70–99)
Glucose-Capillary: 252 mg/dL — ABNORMAL HIGH (ref 70–99)
Glucose-Capillary: 71 mg/dL (ref 70–99)
Glucose-Capillary: 72 mg/dL (ref 70–99)
Glucose-Capillary: 73 mg/dL (ref 70–99)
Glucose-Capillary: 77 mg/dL (ref 70–99)
Glucose-Capillary: 85 mg/dL (ref 70–99)
Glucose-Capillary: 89 mg/dL (ref 70–99)
Glucose-Capillary: 95 mg/dL (ref 70–99)
Glucose-Capillary: 97 mg/dL (ref 70–99)
Glucose-Capillary: 99 mg/dL (ref 70–99)

## 2011-02-19 LAB — BASIC METABOLIC PANEL
BUN: 10 mg/dL (ref 6–23)
BUN: 11 mg/dL (ref 6–23)
BUN: 11 mg/dL (ref 6–23)
BUN: 12 mg/dL (ref 6–23)
BUN: 12 mg/dL (ref 6–23)
BUN: 13 mg/dL (ref 6–23)
BUN: 18 mg/dL (ref 6–23)
BUN: 18 mg/dL (ref 6–23)
BUN: 25 mg/dL — ABNORMAL HIGH (ref 6–23)
BUN: 27 mg/dL — ABNORMAL HIGH (ref 6–23)
BUN: 29 mg/dL — ABNORMAL HIGH (ref 6–23)
BUN: 8 mg/dL (ref 6–23)
BUN: 9 mg/dL (ref 6–23)
BUN: 11 mg/dL (ref 6–23)
CO2: 24 mEq/L (ref 19–32)
CO2: 24 mEq/L (ref 19–32)
CO2: 26 mEq/L (ref 19–32)
CO2: 27 mEq/L (ref 19–32)
CO2: 29 mEq/L (ref 19–32)
CO2: 30 mEq/L (ref 19–32)
CO2: 30 mEq/L (ref 19–32)
CO2: 31 mEq/L (ref 19–32)
CO2: 31 mEq/L (ref 19–32)
CO2: 31 mEq/L (ref 19–32)
CO2: 32 mEq/L (ref 19–32)
CO2: 32 mEq/L (ref 19–32)
CO2: 33 mEq/L — ABNORMAL HIGH (ref 19–32)
CO2: 32 mEq/L (ref 19–32)
Calcium: 7.4 mg/dL — ABNORMAL LOW (ref 8.4–10.5)
Calcium: 8 mg/dL — ABNORMAL LOW (ref 8.4–10.5)
Calcium: 8.1 mg/dL — ABNORMAL LOW (ref 8.4–10.5)
Calcium: 8.1 mg/dL — ABNORMAL LOW (ref 8.4–10.5)
Calcium: 8.2 mg/dL — ABNORMAL LOW (ref 8.4–10.5)
Calcium: 8.3 mg/dL — ABNORMAL LOW (ref 8.4–10.5)
Calcium: 8.3 mg/dL — ABNORMAL LOW (ref 8.4–10.5)
Calcium: 8.4 mg/dL (ref 8.4–10.5)
Calcium: 8.4 mg/dL (ref 8.4–10.5)
Calcium: 8.5 mg/dL (ref 8.4–10.5)
Calcium: 8.5 mg/dL (ref 8.4–10.5)
Calcium: 8.6 mg/dL (ref 8.4–10.5)
Calcium: 8.6 mg/dL (ref 8.4–10.5)
Calcium: 9.3 mg/dL (ref 8.4–10.5)
Chloride: 100 mEq/L (ref 96–112)
Chloride: 100 mEq/L (ref 96–112)
Chloride: 101 mEq/L (ref 96–112)
Chloride: 102 mEq/L (ref 96–112)
Chloride: 102 mEq/L (ref 96–112)
Chloride: 103 mEq/L (ref 96–112)
Chloride: 103 mEq/L (ref 96–112)
Chloride: 103 mEq/L (ref 96–112)
Chloride: 103 mEq/L (ref 96–112)
Chloride: 104 mEq/L (ref 96–112)
Chloride: 104 mEq/L (ref 96–112)
Chloride: 105 mEq/L (ref 96–112)
Chloride: 106 mEq/L (ref 96–112)
Chloride: 103 mEq/L (ref 96–112)
Creatinine, Ser: 0.88 mg/dL (ref 0.4–1.5)
Creatinine, Ser: 0.9 mg/dL (ref 0.4–1.5)
Creatinine, Ser: 0.94 mg/dL (ref 0.4–1.5)
Creatinine, Ser: 0.96 mg/dL (ref 0.4–1.5)
Creatinine, Ser: 0.97 mg/dL (ref 0.4–1.5)
Creatinine, Ser: 0.98 mg/dL (ref 0.4–1.5)
Creatinine, Ser: 0.99 mg/dL (ref 0.4–1.5)
Creatinine, Ser: 1 mg/dL (ref 0.4–1.5)
Creatinine, Ser: 1.01 mg/dL (ref 0.4–1.5)
Creatinine, Ser: 1.03 mg/dL (ref 0.4–1.5)
Creatinine, Ser: 1.51 mg/dL — ABNORMAL HIGH (ref 0.4–1.5)
Creatinine, Ser: 2.36 mg/dL — ABNORMAL HIGH (ref 0.4–1.5)
Creatinine, Ser: 2.38 mg/dL — ABNORMAL HIGH (ref 0.4–1.5)
Creatinine, Ser: 0.89 mg/dL (ref 0.4–1.5)
GFR calc Af Amer: 32 mL/min — ABNORMAL LOW (ref >60)
GFR calc Af Amer: 32 mL/min — ABNORMAL LOW (ref >60)
GFR calc Af Amer: 54 mL/min — ABNORMAL LOW (ref >60)
GFR calc Af Amer: >60 mL/min (ref >60)
GFR calc Af Amer: >60 mL/min (ref >60)
GFR calc Af Amer: >60 mL/min (ref >60)
GFR calc Af Amer: >60 mL/min (ref >60)
GFR calc Af Amer: >60 mL/min (ref >60)
GFR calc Af Amer: >60 mL/min (ref >60)
GFR calc Af Amer: >60 mL/min (ref >60)
GFR calc Af Amer: >60 mL/min (ref >60)
GFR calc Af Amer: >60 mL/min (ref >60)
GFR calc Af Amer: >60 mL/min (ref >60)
GFR calc Af Amer: >60 mL/min (ref >60)
GFR calc non Af Amer: 26 mL/min — ABNORMAL LOW (ref >60)
GFR calc non Af Amer: 26 mL/min — ABNORMAL LOW (ref >60)
GFR calc non Af Amer: 44 mL/min — ABNORMAL LOW (ref >60)
GFR calc non Af Amer: >60 mL/min (ref >60)
GFR calc non Af Amer: >60 mL/min (ref >60)
GFR calc non Af Amer: >60 mL/min (ref >60)
GFR calc non Af Amer: >60 mL/min (ref >60)
GFR calc non Af Amer: >60 mL/min (ref >60)
GFR calc non Af Amer: >60 mL/min (ref >60)
GFR calc non Af Amer: >60 mL/min (ref >60)
GFR calc non Af Amer: >60 mL/min (ref >60)
GFR calc non Af Amer: >60 mL/min (ref >60)
GFR calc non Af Amer: >60 mL/min (ref >60)
GFR calc non Af Amer: >60 mL/min (ref >60)
Glucose, Bld: 102 mg/dL — ABNORMAL HIGH (ref 70–99)
Glucose, Bld: 111 mg/dL — ABNORMAL HIGH (ref 70–99)
Glucose, Bld: 126 mg/dL — ABNORMAL HIGH (ref 70–99)
Glucose, Bld: 133 mg/dL — ABNORMAL HIGH (ref 70–99)
Glucose, Bld: 146 mg/dL — ABNORMAL HIGH (ref 70–99)
Glucose, Bld: 158 mg/dL — ABNORMAL HIGH (ref 70–99)
Glucose, Bld: 171 mg/dL — ABNORMAL HIGH (ref 70–99)
Glucose, Bld: 230 mg/dL — ABNORMAL HIGH (ref 70–99)
Glucose, Bld: 238 mg/dL — ABNORMAL HIGH (ref 70–99)
Glucose, Bld: 76 mg/dL (ref 70–99)
Glucose, Bld: 93 mg/dL (ref 70–99)
Glucose, Bld: 94 mg/dL (ref 70–99)
Glucose, Bld: 95 mg/dL (ref 70–99)
Glucose, Bld: 209 mg/dL — ABNORMAL HIGH (ref 70–99)
Potassium: 3.3 mEq/L — ABNORMAL LOW (ref 3.5–5.1)
Potassium: 3.6 mEq/L (ref 3.5–5.1)
Potassium: 3.8 mEq/L (ref 3.5–5.1)
Potassium: 3.9 mEq/L (ref 3.5–5.1)
Potassium: 3.9 mEq/L (ref 3.5–5.1)
Potassium: 3.9 mEq/L (ref 3.5–5.1)
Potassium: 4 mEq/L (ref 3.5–5.1)
Potassium: 4 mEq/L (ref 3.5–5.1)
Potassium: 4 mEq/L (ref 3.5–5.1)
Potassium: 4.1 mEq/L (ref 3.5–5.1)
Potassium: 4.1 mEq/L (ref 3.5–5.1)
Potassium: 4.2 mEq/L (ref 3.5–5.1)
Potassium: 4.3 mEq/L (ref 3.5–5.1)
Potassium: 4 mEq/L (ref 3.5–5.1)
Sodium: 131 mEq/L — ABNORMAL LOW (ref 135–145)
Sodium: 135 mEq/L (ref 135–145)
Sodium: 136 mEq/L (ref 135–145)
Sodium: 137 mEq/L (ref 135–145)
Sodium: 138 mEq/L (ref 135–145)
Sodium: 139 mEq/L (ref 135–145)
Sodium: 139 mEq/L (ref 135–145)
Sodium: 139 mEq/L (ref 135–145)
Sodium: 139 mEq/L (ref 135–145)
Sodium: 140 mEq/L (ref 135–145)
Sodium: 141 mEq/L (ref 135–145)
Sodium: 141 mEq/L (ref 135–145)
Sodium: 142 mEq/L (ref 135–145)
Sodium: 141 mEq/L (ref 135–145)

## 2011-02-19 LAB — APTT
aPTT: 36 seconds (ref 24–37)
aPTT: 37 seconds (ref 24–37)
aPTT: 38 seconds — ABNORMAL HIGH (ref 24–37)
aPTT: 43 seconds — ABNORMAL HIGH (ref 24–37)
aPTT: 46 seconds — ABNORMAL HIGH (ref 24–37)
aPTT: 47 seconds — ABNORMAL HIGH (ref 24–37)
aPTT: 61 seconds — ABNORMAL HIGH (ref 24–37)

## 2011-02-19 LAB — CBC
HCT: 29 % — ABNORMAL LOW (ref 39.0–52.0)
HCT: 30.2 % — ABNORMAL LOW (ref 39.0–52.0)
HCT: 30.2 % — ABNORMAL LOW (ref 39.0–52.0)
HCT: 30.9 % — ABNORMAL LOW (ref 39.0–52.0)
HCT: 32.3 % — ABNORMAL LOW (ref 39.0–52.0)
HCT: 33.7 % — ABNORMAL LOW (ref 39.0–52.0)
HCT: 34.3 % — ABNORMAL LOW (ref 39.0–52.0)
HCT: 36.2 % — ABNORMAL LOW (ref 39.0–52.0)
HCT: 42.2 % (ref 39.0–52.0)
Hemoglobin: 10 g/dL — ABNORMAL LOW (ref 13.0–17.0)
Hemoglobin: 10.4 g/dL — ABNORMAL LOW (ref 13.0–17.0)
Hemoglobin: 10.4 g/dL — ABNORMAL LOW (ref 13.0–17.0)
Hemoglobin: 10.8 g/dL — ABNORMAL LOW (ref 13.0–17.0)
Hemoglobin: 11.4 g/dL — ABNORMAL LOW (ref 13.0–17.0)
Hemoglobin: 11.6 g/dL — ABNORMAL LOW (ref 13.0–17.0)
Hemoglobin: 12.2 g/dL — ABNORMAL LOW (ref 13.0–17.0)
Hemoglobin: 9.7 g/dL — ABNORMAL LOW (ref 13.0–17.0)
Hemoglobin: 14.4 g/dL (ref 13.0–17.0)
MCHC: 33.1 g/dL (ref 30.0–36.0)
MCHC: 33.5 g/dL (ref 30.0–36.0)
MCHC: 33.5 g/dL (ref 30.0–36.0)
MCHC: 33.8 g/dL (ref 30.0–36.0)
MCHC: 33.8 g/dL (ref 30.0–36.0)
MCHC: 33.8 g/dL (ref 30.0–36.0)
MCHC: 33.9 g/dL (ref 30.0–36.0)
MCHC: 34.3 g/dL (ref 30.0–36.0)
MCHC: 34 g/dL (ref 30.0–36.0)
MCV: 91.1 fL (ref 78.0–100.0)
MCV: 91.3 fL (ref 78.0–100.0)
MCV: 91.3 fL (ref 78.0–100.0)
MCV: 91.3 fL (ref 78.0–100.0)
MCV: 91.9 fL (ref 78.0–100.0)
MCV: 92 fL (ref 78.0–100.0)
MCV: 92.4 fL (ref 78.0–100.0)
MCV: 93.3 fL (ref 78.0–100.0)
MCV: 90.6 fL (ref 78.0–100.0)
Platelets: 149 10*3/uL — ABNORMAL LOW (ref 150–400)
Platelets: 155 10*3/uL (ref 150–400)
Platelets: 178 10*3/uL (ref 150–400)
Platelets: 203 10*3/uL (ref 150–400)
Platelets: 235 10*3/uL (ref 150–400)
Platelets: 241 10*3/uL (ref 150–400)
Platelets: 261 10*3/uL (ref 150–400)
Platelets: 282 10*3/uL (ref 150–400)
Platelets: 153 10*3/uL (ref 150–400)
RBC: 3.18 MIL/uL — ABNORMAL LOW (ref 4.22–5.81)
RBC: 3.28 MIL/uL — ABNORMAL LOW (ref 4.22–5.81)
RBC: 3.32 MIL/uL — ABNORMAL LOW (ref 4.22–5.81)
RBC: 3.36 MIL/uL — ABNORMAL LOW (ref 4.22–5.81)
RBC: 3.54 MIL/uL — ABNORMAL LOW (ref 4.22–5.81)
RBC: 3.61 MIL/uL — ABNORMAL LOW (ref 4.22–5.81)
RBC: 3.76 MIL/uL — ABNORMAL LOW (ref 4.22–5.81)
RBC: 3.92 MIL/uL — ABNORMAL LOW (ref 4.22–5.81)
RBC: 4.66 MIL/uL (ref 4.22–5.81)
RDW: 14.2 % (ref 11.5–15.5)
RDW: 14.4 % (ref 11.5–15.5)
RDW: 14.5 % (ref 11.5–15.5)
RDW: 14.6 % (ref 11.5–15.5)
RDW: 14.7 % (ref 11.5–15.5)
RDW: 14.8 % (ref 11.5–15.5)
RDW: 14.8 % (ref 11.5–15.5)
RDW: 15.3 % (ref 11.5–15.5)
RDW: 14.6 % (ref 11.5–15.5)
WBC: 11.5 10*3/uL — ABNORMAL HIGH (ref 4.0–10.5)
WBC: 13.1 10*3/uL — ABNORMAL HIGH (ref 4.0–10.5)
WBC: 13.1 10*3/uL — ABNORMAL HIGH (ref 4.0–10.5)
WBC: 13.6 10*3/uL — ABNORMAL HIGH (ref 4.0–10.5)
WBC: 13.7 10*3/uL — ABNORMAL HIGH (ref 4.0–10.5)
WBC: 14.1 10*3/uL — ABNORMAL HIGH (ref 4.0–10.5)
WBC: 16 10*3/uL — ABNORMAL HIGH (ref 4.0–10.5)
WBC: 9.2 10*3/uL (ref 4.0–10.5)
WBC: 8.8 10*3/uL (ref 4.0–10.5)

## 2011-02-19 LAB — BLOOD GAS, ARTERIAL
Acid-Base Excess: 2.6 mmol/L — ABNORMAL HIGH (ref 0.0–2.0)
Acid-base deficit: 0.3 mmol/L (ref 0.0–2.0)
Acid-base deficit: 0.3 mmol/L (ref 0.0–2.0)
Bicarbonate: 25.2 mEq/L — ABNORMAL HIGH (ref 20.0–24.0)
Bicarbonate: 25.7 mEq/L — ABNORMAL HIGH (ref 20.0–24.0)
Bicarbonate: 27 mEq/L — ABNORMAL HIGH (ref 20.0–24.0)
Delivery systems: POSITIVE
Delivery systems: POSITIVE
Drawn by: 244801
Drawn by: 244851
Drawn by: 30806
Expiratory PAP: 6
Expiratory PAP: 6
FIO2: 0.4 %
Inspiratory PAP: 12
Inspiratory PAP: 12
O2 Content: 10 L/min
O2 Content: 3 L/min
O2 Saturation: 90.8 %
O2 Saturation: 90.9 %
O2 Saturation: 95.4 %
Patient temperature: 98.6
Patient temperature: 98.6
Patient temperature: 98.6
TCO2: 26.8 mmol/L (ref 0–100)
TCO2: 27.4 mmol/L (ref 0–100)
TCO2: 28.4 mmol/L (ref 0–100)
pCO2 arterial: 44.8 mmHg (ref 35.0–45.0)
pCO2 arterial: 51.3 mmHg — ABNORMAL HIGH (ref 35.0–45.0)
pCO2 arterial: 56.5 mmHg — ABNORMAL HIGH (ref 35.0–45.0)
pH, Arterial: 7.279 — ABNORMAL LOW (ref 7.350–7.450)
pH, Arterial: 7.312 — ABNORMAL LOW (ref 7.350–7.450)
pH, Arterial: 7.397 (ref 7.350–7.450)
pO2, Arterial: 65.8 mmHg — ABNORMAL LOW (ref 80.0–100.0)
pO2, Arterial: 69.8 mmHg — ABNORMAL LOW (ref 80.0–100.0)
pO2, Arterial: 77.7 mmHg — ABNORMAL LOW (ref 80.0–100.0)

## 2011-02-19 LAB — POCT I-STAT 3, ART BLOOD GAS (G3+)
Acid-Base Excess: 1 mmol/L (ref 0.0–2.0)
Acid-Base Excess: 1 mmol/L (ref 0.0–2.0)
Bicarbonate: 24.9 mEq/L — ABNORMAL HIGH (ref 20.0–24.0)
Bicarbonate: 26.4 mEq/L — ABNORMAL HIGH (ref 20.0–24.0)
O2 Saturation: 52 %
O2 Saturation: 88 %
Patient temperature: 37
Patient temperature: 37
TCO2: 26 mmol/L (ref 0–100)
TCO2: 28 mmol/L (ref 0–100)
pCO2 arterial: 36.9 mmHg (ref 35.0–45.0)
pCO2 arterial: 42.7 mmHg (ref 35.0–45.0)
pH, Arterial: 7.399 (ref 7.350–7.450)
pH, Arterial: 7.437 (ref 7.350–7.450)
pO2, Arterial: 28 mmHg — CL (ref 80.0–100.0)
pO2, Arterial: 52 mmHg — ABNORMAL LOW (ref 80.0–100.0)

## 2011-02-19 LAB — LUPUS ANTICOAGULANT PANEL
DRVVT: 55.9 secs — ABNORMAL HIGH (ref 36.1–47.0)
Lupus Anticoagulant: NOT DETECTED
PTT Lupus Anticoagulant: 67.2 secs — ABNORMAL HIGH (ref 36.3–48.8)
PTTLA 4:1 Mix: 59.8 secs — ABNORMAL HIGH (ref 36.3–48.8)
PTTLA Confirmation: 4.7 secs (ref <8.0)
dRVVT Incubated 1:1 Mix: 38.3 secs (ref 36.1–47.0)

## 2011-02-19 LAB — SODIUM, URINE, RANDOM: Sodium, Ur: 11 mEq/L

## 2011-02-19 LAB — CREATININE, URINE, RANDOM: Creatinine, Urine: 295.3 mg/dL

## 2011-02-19 LAB — CULTURE, BLOOD (ROUTINE X 2)
Culture: NO GROWTH
Culture: NO GROWTH

## 2011-02-19 LAB — PROTIME-INR
INR: 1 (ref 0.00–1.49)
INR: 1.1 (ref 0.00–1.49)
INR: 1.2 (ref 0.00–1.49)
INR: 1.6 — ABNORMAL HIGH (ref 0.00–1.49)
INR: 1.6 — ABNORMAL HIGH (ref 0.00–1.49)
INR: 1.9 — ABNORMAL HIGH (ref 0.00–1.49)
INR: 2.2 — ABNORMAL HIGH (ref 0.00–1.49)
INR: 2.3 — ABNORMAL HIGH (ref 0.00–1.49)
Prothrombin Time: 13.4 seconds (ref 11.6–15.2)
Prothrombin Time: 14.3 seconds (ref 11.6–15.2)
Prothrombin Time: 14.9 seconds (ref 11.6–15.2)
Prothrombin Time: 18.5 seconds — ABNORMAL HIGH (ref 11.6–15.2)
Prothrombin Time: 19.3 seconds — ABNORMAL HIGH (ref 11.6–15.2)
Prothrombin Time: 21.7 seconds — ABNORMAL HIGH (ref 11.6–15.2)
Prothrombin Time: 24.2 seconds — ABNORMAL HIGH (ref 11.6–15.2)
Prothrombin Time: 25.4 seconds — ABNORMAL HIGH (ref 11.6–15.2)

## 2011-02-19 LAB — URINALYSIS, ROUTINE W REFLEX MICROSCOPIC
Glucose, UA: NEGATIVE mg/dL
Hgb urine dipstick: NEGATIVE
Ketones, ur: NEGATIVE mg/dL
Nitrite: NEGATIVE
Protein, ur: NEGATIVE mg/dL
Specific Gravity, Urine: 1.022 (ref 1.005–1.030)
Urobilinogen, UA: 0.2 mg/dL (ref 0.0–1.0)
pH: 5 (ref 5.0–8.0)

## 2011-02-19 LAB — PROTHROMBIN GENE MUTATION

## 2011-02-19 LAB — MAGNESIUM
Magnesium: 1.7 mg/dL (ref 1.5–2.5)
Magnesium: 1.7 mg/dL (ref 1.5–2.5)

## 2011-02-19 LAB — FACTOR 5 LEIDEN

## 2011-02-19 LAB — CORTISOL: Cortisol, Plasma: 14 ug/dL

## 2011-02-19 LAB — HEMOGLOBIN AND HEMATOCRIT, BLOOD
HCT: 33.9 % — ABNORMAL LOW (ref 39.0–52.0)
Hemoglobin: 11.4 g/dL — ABNORMAL LOW (ref 13.0–17.0)

## 2011-02-19 LAB — TSH: TSH: 0.016 u[IU]/mL — ABNORMAL LOW (ref 0.350–4.500)

## 2011-02-19 LAB — HEMOGLOBIN A1C
Hgb A1c MFr Bld: 9.5 % — ABNORMAL HIGH (ref 4.6–6.1)
Mean Plasma Glucose: 226 mg/dL

## 2011-02-19 LAB — URINE CULTURE
Colony Count: NO GROWTH
Culture: NO GROWTH
Special Requests: NEGATIVE

## 2011-02-19 LAB — MRSA PCR SCREENING: MRSA by PCR: NEGATIVE

## 2011-02-19 LAB — HEMOCCULT GUIAC POC 1CARD (OFFICE): Fecal Occult Bld: NEGATIVE

## 2011-02-19 LAB — PHOSPHORUS: Phosphorus: 2.5 mg/dL (ref 2.3–4.6)

## 2011-02-19 LAB — HOMOCYSTEINE: Homocysteine: 9.6 umol/L (ref 4.0–15.4)

## 2011-03-29 NOTE — Op Note (Signed)
NAME:  Peter Hall, HONOR NO.:  [id number]   MEDICAL RECORD NO.:  [id number]          PATIENT TYPE:  AMB   LOCATION:  ENDO                         FACILITY:  [facility]   PHYSICIAN:  Georgiana Spinner, M.D.    DATE OF BIRTH:  12-11-1923   DATE OF PROCEDURE:  DATE OF DISCHARGE:                               OPERATIVE REPORT   PROCEDURE:  Colonoscopy.   INDICATIONS:  Colon polyps.   ANESTHESIA:  None further given.  See endoscopy note.   PROCEDURE:  With the patient in the left lateral decubitus position, a  rectal exam was performed which was unremarkable.  Subsequently, the  Pentax videoscopic pediatric colonoscope was inserted in the rectum and  passed under direct vision with pressure applied.  Prep was slightly  suboptimal in that there were areas of liquid brown stool, but we were  able to advance the endoscope to the cecum as identified by ileocecal  thecal valve and appendiceal orifice, both of which were photographed.  From this point, the colonoscope was slowly withdrawn taking  circumferential views of colonic mucosa, stopping to photograph a polyp  seen in the transverse colon which was removed using snare cautery  technique setting of 20/150 blended current.  It was retrieved by  grasping it in a Roth retrieval and removing it.  The endoscope was then  reinserted to this level and withdrawn, taking circumferential views of  the remaining colonic mucosa, stopping next at the splenic flexure where  a second polyp was seen.  It too was photographed and removed again  using snare cautery technique with the same setting and this was  retrieved by suctioning it through the endoscope into a tissue trap.  We  resected a third polyp at approximately 50 cm from anal verge in the  same manner and again retrieved the tissue.  The endoscope was withdrawn  all the way to the rectum which appeared normal on direct and showed  hemorrhoids on retroflexed view.  The endoscope was  straightened and  withdrawn.  The patient's vital signs and pulse oximeter remained  stable.  The patient tolerated procedure well without apparent  complications.   FINDINGS:  Scattered diverticula, polyp in transverse colon, polyp at  splenic flexure, polyp at descending colon at 50 cm from the anal verge.  Internal hemorrhoids were noted as well.   PLAN:  Await biopsy report.  The patient will call me for results and  follow up with me as an outpatient.           ______________________________  Georgiana Spinner, M.D.     GMO/MEDQ  D:  02/19/2009  T:  02/19/2009  Job:  [id number]

## 2011-03-29 NOTE — Consult Note (Signed)
NAME:  BRAILYN, DELMAN NO.:  [id number]   MEDICAL RECORD NO.:  [id number]          PATIENT TYPE:  INP   LOCATION:  2105                         FACILITY:  [facility]   PHYSICIAN:  Heloise Purpura, MD      DATE OF BIRTH:  1924/08/05   DATE OF CONSULTATION:  DATE OF DISCHARGE:                                 CONSULTATION   DATE OF PROCEDURE:  July 01, 2009   REASON FOR CONSULTATION:  Difficulty voiding and urinary retention.   HISTORY:  Mr. Gienger is an 77 year old gentleman seen at the request of  Dr. Shon Baton in consultation for the above problem.  He does have a  history of BPH and lower urinary tract symptoms and apparently had been  given a prescription for tamsulosin which he is only taking  intermittently and not on regular basis.  He is status post an L3-L5  decompression on July 01, 2009 and has had multiple medical issues  during his hospitalization including an acute pulmonary embolus and left  lower extremity DVT as well as acute renal insufficiency which has now  resolved.  His Foley catheter was initially discontinued after surgery  on July 03, 2009.  The patient was then unable to void.  In-and-out  catheterization was performed and postvoid residuals were checked.  He  did not appear to be voiding well.  An indwelling Foley catheter was  replaced on July 05, 2009.  It was noted that the patient had  previously been on tamsulosin and this was restarted on July 03, 2009.  At baseline, he does complain of urinary frequency, dysuria, and  nocturia.  He does have to strain to start his urinary strain but feels  that he has a good stream once it does start going feels that he does  empty his bladder to completion.  He denies a history of hematuria,  UTIs, or urolithiasis.  He denies any fever, nausea, vomiting, chest  pain, or shortness of breath.   PAST MEDICAL HISTORY:  1. Hypertension.  2. Obstructive sleep apnea.  3. Hypothyroidism.  4. Gout.  5. BPH.   PAST SURGICAL HISTORY:  1. Status post a perforated viscus and subsequent repair.  2. Bilateral inguinal hernia repair.  3. L3-L5 decompression.   MEDICATIONS:  Home medications include allopurinol, amlodipine, Lasix,  thyroxine, Benicar, Protonix, potassium chloride, prednisone and Crestor  Flomax, Robaxin and hydrocodone.   ALLERGIES:  No known drug allergies.   FAMILY HISTORY:  There is no history of GU malignancy or  nephrolithiasis.   SOCIAL HISTORY:  He is married and has one son.  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  He denies tobacco or alcohol  use.   REVIEW OF SYSTEMS:  A complete 14-point review of systems was performed.  Pertinent positives include constipation and lower extremity edema as  well as lower urinary tract symptoms as described in history.  He  specifically denies any chest pain, shortness of breath, fever, nausea  and vomiting, or diarrhea.   PHYSICAL EXAMINATION:  VITAL SIGNS:  Temperature 97.7, pulse  82,  respirations 18, blood pressure 101/61.  CONSTITUTIONAL:  Well-developed male in no acute distress.  HEENT:  Normocephalic, atraumatic.  NECK:  Supple without lymphadenopathy.  CARDIOVASCULAR:  Regular rate and rhythm without murmurs.  LUNGS:  He does have diminished breath sounds.  ABDOMEN:  Soft and nondistended, nontender with positive bowel sounds.  GU:  He has an uncircumcised penis without lesions or masses.  He has  bilateral descended testes without masses.  He has an indwelling Foley  catheter draining grossly clear urine.  His prostate is enlarged, smooth  and without nodules.  EXTREMITIES:  He does have bilateral lower extremity edema.  SKIN:  Warm.  NEURO:  Grossly intact.   Laboratory studies urine culture demonstrated no growth.  Serum  creatinine is currently 1.01.  Recent renal ultrasound was reviewed and  demonstrates no hydronephrosis.  He does have 2 right renal cysts and a  left renal  cyst.   IMPRESSION:  Urinary retention likely multifactorial with underlying  baseline benign prostatic hyperplasia.   RECOMMENDATIONS:  I would recommend a repeat voiding trial in the next 1-  2 days, now that he has been on Flomax for a few days.  I would  recommend checking postvoid residual urines and if these are  consistently above 350 mL or he is unable to void, I would recommend  replacing his Foley catheter or even better beginning cleaning the  intermittent catheterization approximately every 4 to 6 hours and  continuing to check post void residual urines.  If he is unable to void  after his next voiding trial, I would also increase his Flomax to 0.8 mg  daily.  I will plan to follow him during his hospitalization.  The  patient very much for this consultation.      Heloise Purpura, MD  Electronically Signed     LB/MEDQ  D:  07/07/2009  T:  07/08/2009  Job:  [id number]   cc:   Alvy Beal, MD

## 2011-03-29 NOTE — Consult Note (Signed)
NAME:  Peter Hall, Peter Hall NO.:  [id number]   MEDICAL RECORD NO.:  [id number]          PATIENT TYPE:  INP   LOCATION:  5041                         FACILITY:  [facility]   PHYSICIAN:  Eduard Clos, MDDATE OF BIRTH:  October 24, 1924   DATE OF CONSULTATION:  07/04/2009  DATE OF DISCHARGE:                                 CONSULTATION   PRIMARY CARE PHYSICIAN:  Dr. Brooke Bonito.   REASON FOR CONSULTATION:  Acute renal failure.   CHIEF COMPLAINT:  The patient's creatinine was found to be increasing   HISTORY OF PRESENT ILLNESS:  This 77 year old male with a history of  hypertension, hypothyroidism, sleep apnea on CPAP, hypothyroidism and  gout had an L3 to L5 decompression on 07/01/2009, and has been observed  in the hospital subsequent to that.  The patient's creatinine was found  to be increasing from 0.9.  Now it is 2.3 and a medical consultation was  called.   The patient denies any nausea, vomiting or diarrhea.  Denies any  abdominal pain, fever or chills.  The patient was largely found to be  hypotensive.  Initially was on a PCA pump.  Presently on p.o. pain  medications.   The patient's vital signs show that over the last 24 hours the patient's  blood pressure is largely around 90 to 70 systolic, heart rate around  100, only one spike of fever to 100.1 degrees.  The patient has already  been given 1 liter of fluid over the last 12 hours, and 125 mL of normal  saline is running at this time.   The patient denies any shortness of breath or chest pain.  Denies any  focal deficits or headache, no visual symptoms.   PAST MEDICAL HISTORY:  1. Hypertension.  2. Obstructive sleep apnea.  3. Hypothyroidism.  4. Gout.   PAST SURGICAL HISTORY:  1. Surgery for perforated viscus in 2003.  2. Bilateral inguinal hernia surgery.  3. Recent decompression surgery for L3, L4.   MEDICATIONS:  1. Presently the patient is on allopurinol 100 mg p.o.  2. Amlodipine 5 mg p.o.  daily.  3. Lasix was given one dose.  4. Lactated Ringer's which has been changed to normal saline.  5. Levothyroxine 50 mcg p.o. daily.  6. Benicar 40 mg p.o. daily.  7. Protonix.  8. Potassium chloride 20 mEq p.o. daily.  9. Prednisone 5 mg p.o. daily.  10.Crestor 10 mg p.o. daily.  11.Flomax 0.4 mg.  12.Robaxin for pain.  13.Morphine sulfate p.r.n.  14.Hydrocodone/acetaminophen.   ALLERGIES:  No known drug allergies.   SOCIAL HISTORY:  The patient lives with his wife.  Denies smoking  cigarettes or drinking alcohol or using illegal drugs   FAMILY HISTORY:  Noncontributory.   REVIEW OF SYSTEMS:  As in the history of present illness.  Nothing else  significant.   PHYSICAL EXAMINATION:  GENERAL:  The patient examined at bedside, not in  acute distress.  Denies any chest pain, shortness of breath.  VITAL SIGNS:  Blood pressure is 71/50, pulse 97 per minute, temperature  98.5, temperature max of  100.1, O2 saturation 99% on 2 liters.  HEENT:  Anicteric.  No pallor.  CHEST:  Chest bilaterally clear.  No rhonchi present.  HEART:  S1 and S2.  ABDOMEN:  Soft, nontender.  Bowel sounds are heard.  CNS/EXTREMITIES:  Alert, awake and oriented to time, place and person.  Moves upper and lower extremities.  Peripheral pulses felt.  No edema.   LABORATORY DATA:  Hemoglobin and hematocrit done on July 04, 2009,  are 11.4 and 33.9 respectively.  The last WBC was 16.  The basic  metabolic panel:  Serum sodium of 137, potassium 4.2, chloride 101,  carbon dioxide 30, glucose 133, BUN 25, creatinine 2.3.  This is  increased from 0.9 on August 19. 2010, two days ago.  Calcium is 8.2.   Chest x-ray on July 04, 2009,  shows no strong evidence of pleural  effusion, left lingular prolapse and less superimposed bibasilar  atelectasis.   IMPRESSION:  1. Acute renal failure, multifactorial      a.     Secondary to persistent hypotension.      b.     Dehydration, which also is contributing to  her hypotension.      c.     He is on Lasix, adding to the dehydration and hypotension.  2. History of hypertension, presently hypotensive.  3. Gout.  4. Hypothyroidism.  5. Obstructive sleep apnea,  on CPAP.   PLAN:  1. At this time the patient is hypotensive.  Will give a normal saline      bolus of 1 liter, followed by 125 cc/hour.  2. Will keep check of the patient's intake and output.  Get a renal      ultrasound to rule out any obstruction 3.  Will discontinue      Benicar, Norvasc and Lasix.  3. Check urinalysis,  urine creatinine and sodium  4. Will check a random cortisol level and hemoglobin A1c.  5. Will place the patient on a stress dose of steroids, as the patient      was on prednisone for the last three or four years.  6. Follow BMET and CBC closely.  Will also place the patient on CBGs      with sliding scale coverage.   Further recommendations as his condition evolves.  Thanks for involving  [place] in the patient's care.  Will follow along with you.      Eduard Clos, MD  Electronically Signed     ANK/MEDQ  D:  07/04/2009  T:  07/04/2009  Job:  [id number]   cc:   Alvy Beal, MD  Brooke Bonito, M.D.

## 2011-03-29 NOTE — Op Note (Signed)
NAME:  Peter Hall, PARCELL NO.:  [id number]   MEDICAL RECORD NO.:  [id number]          PATIENT TYPE:  AMB   LOCATION:  ENDO                         FACILITY:  [facility]   PHYSICIAN:  Georgiana Spinner, M.D.    DATE OF BIRTH:  26-Sep-1924   DATE OF PROCEDURE:  02/19/2009  DATE OF DISCHARGE:                               OPERATIVE REPORT   PROCEDURE:  Upper endoscopy.   INDICATIONS:  History of ulcer disease.  History of H. pylori gastritis.   ANESTHESIA:  Fentanyl 50 mcg, Versed 4 mg.   PROCEDURE:  With the patient mildly sedated in the left lateral  decubitus position, the patient desaturated and we gave him Narcan 1 mg  and Romazicon 2 mL and he reversed, but he was still drowsy enough that  we decided to advance the Pentax videoscopic endoscope into the mouth,  passed under direct vision through the esophagus, which appeared normal,  into the stomach.  Fundus, body, antrum, duodenal bulb, second portion  of duodenum all appeared normal.  From this point, the endoscope was  slowly withdrawn, taking circumferential views of the duodenal mucosa  until the endoscope been pulled back into stomach and placed in  retroflexion to view the stomach from below.  The endoscope was  straightened and withdrawn, taking circumferential views of the  remaining gastric and esophageal mucosa.  The patient's vital signs and  pulse oximeter remained stable.  The patient tolerated procedure well  without apparent complications.   FINDINGS:  Slightly loose wrap of the GE junction around the endoscope,  and a small hiatal hernia.  Otherwise unremarkable examination.   PLAN:  Proceed to colonoscopy.           ______________________________  Georgiana Spinner, M.D.     GMO/MEDQ  D:  02/19/2009  T:  02/19/2009  Job:  [id number]

## 2011-03-29 NOTE — Op Note (Signed)
NAME:  Peter Hall, Peter Hall NO.:  [id number]   MEDICAL RECORD NO.:  [id number]          PATIENT TYPE:  INP   LOCATION:  3114                         FACILITY:  [facility]   PHYSICIAN:  Alvy Beal, MD    DATE OF BIRTH:  05/26/1924   DATE OF PROCEDURE:  DATE OF DISCHARGE:                               OPERATIVE REPORT   PREOPERATIVE DIAGNOSIS:  Lumbar spinal stenosis.   POSTOPERATIVE DIAGNOSIS:  Lumbar spinal stenosis.   PLANNED OPERATIVE PROCEDURE:  L3-L5 lumbar decompression.   COMPLICATIONS:  None.   CONDITION:  Stable.   FIRST ASSISTANT:  Crissie Reese, PA   INTRAOPERATIVE FINDINGS:  Significant spinal stenosis especially at L3-  4.  Required extensive decompression and compromise to the facet  complex.  Because of said decompression, an in situ arthrodesis was done  at L3-4.  Therefore, final procedure was L3-4 and L4-5 lumbar  decompression with L3-4 arthrodesis.   This is a very pleasant 77 year old gentleman who was in his usual state  of good-to-excellent health without significant problems.  He is having  prolonged bilateral lower extremity pain and back pain for sometime now.  Attempts at conservative management have failed to alleviate his  symptoms and so I elected to proceed with surgery.  Preoperative  evaluation confirmed the diagnosis of lumbar spinal stenosis with  secondary neurogenic claudication.  The patient's maximal level stenosis  is at L3-4, and L4-5.  After discussing risks, benefits, and  alternatives to surgery, consent was obtained.   OPERATIVE NOTE:  The patient was brought to the operating room and put  supine on the operating table.  After successful induction of general  anesthesia and endotracheal intubation, TEDs, SCDs, and Foley were  applied.  The patient was turned prone onto a Wilson frame.  All bony  prominences were well padded and the back was prepped and draped in a  standard fashion.   Appropriate time-out was  then performed confirming patient, procedure,  duration, and estimated blood loss.  Once this was completed, I then  proceeded with the surgery.   A midline incision was made approximately 1 inch in length starting over  the L3-4 space.  Sharp dissection was carried out down to the deep  fascia.  Deep fascia was sharply incised and using a Cobb elevator I  stripped the paraspinal muscles bilaterally to expose the L3 spinous  process as well as the L4 spinous process.  I then placed a Penfield #4  underneath the L3 lamina and took an intraoperative lateral x-ray.  This  confirmed that I was at the L3-4 space.  I then extended my incision  caudally down to the inferior aspect of the L5 spinous process.  Again,  sharp dissection was carried out down through the adipose tissue to the  deep fascia.  The deep fascia was sharply incised and I continued my  exposure.  I exposed the entire L4 and L5 spinous processes using a Cobb  elevator and Bovie to strip the paraspinal muscles to expose the bone.  I went out to the facet joint capsule.  Care was taken at this point not  to violate the facet capsules.  Once I had bilateral exposure, I then  placed pokers at the L2-3 and the L5-S1 interspinous process.  I then  took a second intraoperative lateral x-ray which confirmed the  appropriate level and the space which I was going to do my  decompression.   At this point in time confirming position and level, I continued with my  decompression.   Using a double-action Leksell rongeur, I removed the entire spinous  process of L5, L4, and L3.  I then used the double-action Leksell  rongeur to perform a near complete laminotomy of L5, L4, and L3.  Once I  had removed the bulk of the bone with a double-action Leksell rongeur, I  then proceeded with using a microcurette to develop a plane underneath  the remainder of the L5 lamina.  Once I had this plane, I expanded it  with a Penfield #4 and then placed  a Neuro Patty between the thecal sac  and bone.  I then used 2 and 3 mm Kerrison to complete central  decompression of L5.  I then went out bilaterally completing the L5  laminectomy.  Once a thorough decompression was done, I was able to  palpate the L5 pedicle and I carried out my lateral recess decompression  until I could visualize the medial wall of the pedicle.  This was done  bilaterally.  I then did a generous foraminotomy at L5.   At this point with the L5 decompression complete, I continued my  decompression up cranially.  I again resected the ligamentum flavum and  used a Penfield #4 to develop a plane between the ligamentum flavum,  which was significantly thickened and the underlying thecal sac.  Protecting the thecal sac with a Penfield #4, I continued using the 2  and 3 mm Kerrison to remove the buckled, thickened, hypertrophied  ligamentum flavum.  I then again carried my dissection out to the medial  wall of the L4 pedicle and performed a generous foraminotomy.  At this  level, though, there was significant synovial changes within the facet  complex and significant spur formation.  I was required to do a very  extensive posterolateral decompression and I was concerned that I  compromised the viability of the facet capsule and the pars.  At this  point, I repeated this decompressive procedure at the L3-4 level.  At  this point, I could visualize the medial and the inferomedial aspect of  the L3 pedicle bilaterally and I was able to decompress in the lateral  recess and out the L3 neural foramen.  Once I had the central and  lateral recess decompression complete and the foraminotomies complete, I  was able to freely pass a hockey stick (dural elevator) along the  lateral recess superiorly towards L2 and inferiorly toward the S1 and  out each of the neural foramen bilaterally with no significant problem.  There was no further neural compressive bone spur or ligamentum  flavum  node.  At this point, I was very pleased with my decompression, but  again I was concerned about the extent of the decompression at L3-4.  This was the most stenotic level preoperatively.  Given the fact there  was a risk for iatrogenic instability, I elected to proceed with in situ  un-instrumented arthrodesis.  At this point, I used a Hibbs retractor to  very mobilize the soft tissue and I dissected over the  L4-5, L2-3, and  L3-4 facet complexes.  This allowed me to expose the L3 and L4  transverse processes.  I then used a high-speed bur to decorticate the  transverse process and then packed the posterolateral gutter with local  bone that I had harvested from the decompression.  I then supplemented  this with Actifuse.   At this point with a significant posterolateral bone mass present and  the decompression adequately done, I irrigated copiously with normal  saline.  Using bipolar electrocautery, I obtained hemostasis and then  used FloSeal to maintain it.  I then made sure I had adequate  hemostasis.  I irrigated it again and then placed a thrombin-soaked  Gelfoam patty over the entire exposed thecal sac.   I then placed the drain and closed the deep fascia with interrupted #1  Vicryl sutures, superficial with 2-0 Vicryl sutures, and a 3-0 Monocryl  for the skin.  Steri-Strips and dry dressing were applied.  The patient  was extubated and  transferred to PACU.  Patient required resiratory support (CPAP) due to  underlying sleep apnea.  As a result of airway obstruction patient was  admitted overnight to ICU per anesthesia recommendations.  Critical care  team consult was requested.  At the end of the case, all needle and  sponge counts were correct.      Alvy Beal, MD  Electronically Signed     DDB/MEDQ  D:  07/01/2009  T:  07/02/2009  Job:  [id number]

## 2011-03-29 NOTE — Discharge Summary (Signed)
NAME:  Peter Hall, Peter Hall NO.:  [id number]   MEDICAL RECORD NO.:  [id number]          PATIENT TYPE:  INP   LOCATION:  3012                         FACILITY:  [facility]   PHYSICIAN:  Alvy Beal, MD    DATE OF BIRTH:  1924/10/27   DATE OF ADMISSION:  07/01/2009  DATE OF DISCHARGE:  07/13/2009                               DISCHARGE SUMMARY   ADMISSION DIAGNOSES:  Lumbar spinal stenosis.   DISCHARGE DIAGNOSES:  1. Lumbar spinal stenosis status post L3-L5 lumbar decompression.  2. Acute renal failure.  3. Urinary retention secondary to benign prostatic hypertrophy.  4. Deep vein thrombosis.  5. Pulmonary embolism.   PROCEDURE PERFORMED:  Operative procedure on July 01, 2009 was an L3-  L5 lumbar decompression done by Dr. Venita Lick.   CONSULTATIONS:  Two consultations were obtained, one on July 04, 2009  by Dr. Toniann Fail for acute renal failure.  The second one was on July 07, 2009 by Dr. Laverle Patter for difficulty voiding with urinary retention.   BRIEF HISTORY:  Peter Hall is a very pleasant 77 year old gentleman who  was in his usual state of health without any significant problems until  he began to have prolonged bilateral lower extremity pain and back pain.  All attempts at conservative management had failed to alleviate his  symptoms.  Therefore, after a long discussion Dr. Shon Baton, he elected to  proceed with surgery.  Preoperative radiographs did confirm a lumbar  spinal stenosis with neurogenic claudication.  All appropriate risks,  benefits, and alternatives to surgery were discussed and the patient did  consent to the procedure.   HOSPITAL COURSE:  Peter Hall hospital course was approximately 12 days  in length.  Initially and postoperatively, there was some difficulty  extubating the patient, so he was initially placed on the stepdown unit  for observation while his home CPAP settings were adjusted.  Postoperatively day #1 he was deemed stable  to be discharged from the  stepdown unit to return to the orthopedics floor.  He was having no  difficulty until, unfortunately, in the evening of postoperative day #2  it was noted that the patient's blood pressure began to drop and he was  having low urinary output.  Bladder scan had demonstrated only 60 mL in  his bladder despite all attempts at giving the patient fluid boluses.  Therefore, he was evaluated by Dr. Shon Baton, and there was no evidence of  acute cauda equina noted and medicine consultation was obtained to treat  the acute renal failure.  The patient slowly began to improve, and it  was noted that his GFR began to return to normal, and he was not having  any difficulty with his blood pressure.  It was noted that he was still  complaining of some urinary retention, and he was also complaining of  some lower extremity leg pain.  Therefore, postoperatively day #5 a  duplex ultrasound was done of his lower extremity.  At that point in  time it was noted that he was positive for a DVT in his left popliteal  vein.  Given the fact that he was having the leg pain and later that day  was having again some complaints of shortness of breath, medicine  returned him down to the stepdown unit.  The patient was slowly weaned  from his steroids, and again began to respond positively to fluid  management and his creatinine also began to improve.  However, given the  fact that he did have the positive DVT, a CT scan of his chest was  obtained and that also was positive for signs of a pulmonary embolism.  The patient was initially started on Lovenox and Coumadin when the  initial DVT was noted.  The patient was still having complaints of  urinary retention.  Therefore, a urology consultation was obtained, and  they recommended continuing Peter Hall on Flomax.  The patient's INR  slowly began to raise and by postoperative day #12, his INR was 2.3 and  he was otherwise deemed stable to be  discharged to a skilled nursing  facility.  Please note, prior to the patient's discharge, his suture was  removed.   DISPOSITION:  The patient is being discharged to [facility]  in stable condition.   ACTIVITY:  The patient is to ambulate as much as tolerated.  He is to  avoid any bending, stooping, twisting, and/or squatting.  The patient  may shower.  He is not to soak the incision, and he is to again walk  every day as much as tolerated.  He can continue on regular diet.   DISCHARGE MEDICATIONS:  1. The patient is being discharged home on his current medication of      Crestor 10 mg once in the morning.  2. Synthroid 50 mcg once in the morning.  3. Allopurinol 100 mg once in the morning.  4. Pantoprazole 40 mg once in the morning.  5. Potassium chloride 20 mEq once in the morning.  6. Exforge 5/320 once in the morning.   DISCONTINUED MEDICATIONS:  1. The patient is to discontinue the use of prednisone 5 mg.  2. He will also discontinue the use of Celebrex 200 mg.  3. He is also to discontinue use of Viagra.   NEW MEDICATIONS AT DISCHARGE:  1. Include Percocet 5/325 one tablet p.o. q.6 hours p.r.n. pain.  2. He is also being discharged on Coumadin 5 mg p.o. daily with a      target INR of 2.2 to 2.5.  3. Robaxin 500 mg 1 tab p.o. q.8 hours p.r.n. muscle spasms.  4. Also Flomax 0.8 mg 1 tablet p.o. daily.   FOLLOW UP:  The patient is instructed to follow up with Dr. Shon Baton in 1  month.  At that point in time, it will be the 6-week mark and we will  again see him in the office.  If he is doing well at that point in time,  we will start him in some form of physical therapy.  The patient is also  instructed to follow back up with urology, Dr. Laverle Patter with [facility].  The patient is to call and schedule that appointment.  That  number is [phone].  The patient is also instructed to follow back up  with his primary care physician, Dr. Juleen China, to manage the fact  that he  has had a DVT and a pulmonary embolism, and he needs to be on lifetime  Coumadin therapy.   DISCHARGE INSTRUCTIONS:  The patient is instructed again to follow up  with Dr. Shon Baton  at [phone] with [facility] in 1 month.  He  is to call our office at [phone] if he has any increasing pain, leg  pain, he notes any pain or redness around the area, any drainage from  the incision site, any fevers and/or chills, any loss of bowel or  bladder function.      Crissie Reese, PA      Alvy Beal, MD  Electronically Signed    AC/MEDQ  D:  07/13/2009  T:  07/13/2009  Job:  [id number]   cc:   Hassan Rowan  Heloise Purpura, MD  Brooke Bonito, M.D.

## 2011-03-30 ENCOUNTER — Other Ambulatory Visit: Payer: Self-pay | Admitting: Endocrinology

## 2011-03-30 DIAGNOSIS — E049 Nontoxic goiter, unspecified: Secondary | ICD-10-CM

## 2011-03-31 ENCOUNTER — Ambulatory Visit
Admission: RE | Admit: 2011-03-31 | Discharge: 2011-03-31 | Disposition: A | Discharge status: Home / Self Care | Payer: BC Managed Care – PPO | Source: Ambulatory Visit | Attending: Endocrinology | Admitting: Endocrinology

## 2011-03-31 DIAGNOSIS — E049 Nontoxic goiter, unspecified: Secondary | ICD-10-CM

## 2011-04-01 NOTE — H&P (Signed)
[facility]  Patient:    Peter Hall, Peter Hall. Visit Number: [id number] MRN: [id number]          Service Type: Attending:  Zigmund Daniel, M.D. Dictated by:   Zigmund Daniel, M.D. Adm. Date:  09/22/01                           History and Physical  CHIEF COMPLAINT:  Abdominal pain.  HISTORY OF PRESENT ILLNESS:  The patient is a 77 year old black male who has enjoyed generally good health and has no history of any particular GI problems but has been having abdominal pain for about two days.  It became very severe through the night, and he came to the emergency department.  He was found to have abdominal tenderness and was hypotensive, but that responded well to intravenous fluids.  He was seen by the emergency physician and by me, and a CT scan was obtained showing a lot of free fluid and evidence of perforation of the abdomen.  He is admitted to the hospital for surgery.  There is no history of peptic ulcer disease or diverticulitis.  He has not had a fever.  ALLERGIES:  No known allergies.  PAST MEDICAL HISTORY:  General health good.  He has high blood pressure and is on Plendil and hydrochlorothiazide.  He takes an aspirin daily.  He has hypothyroidism and is on thyroid replacement.  He does not smoke or drink alcoholic beverages.  He takes no nonprescription medicines except for occasional over-the-counter pain relievers and vitamins.  He has had not GI surgery but has had bilateral inguinal hernia repair.  He got over that well. He had pneumonia in 2001, treated as an outpatient.  He had seizures in the past but at that time was drinking alcohol heavily and has stopped.  He says he has borderline diabetes.  REVIEW OF SYSTEMS, FAMILY HISTORY, CHILDHOOD ILLNESSES:  Unremarkable.  PHYSICAL EXAMINATION:  VITAL SIGNS:  Temperature and vital signs unremarkable at this time per nursing report except for slightly low blood pressure.  GENERAL:   Moderately overweight.  He was in acute distress, but the mental status was normal.  HEENT:  Head, neck, eyes, ears, nose, mouth, and throat unremarkable.  CHEST:  Clear to auscultation.  HEART:  Rate was normal, rhythm regular.  No murmur or gallop noted.  ABDOMEN:  Diffusely tender and slightly distended.  RECTAL:  Unremarkable.  EXTREMITIES:  Normal.  SKIN:  Normal.  LYMPH NODES:  None enlarged.  NEUROLOGIC:  Normal.  IMPRESSION:  Perforated viscus, probable duodenal ulcer.  PLAN:  Immediate surgery. Dictated by:   Zigmund Daniel, M.D. Attending:  Zigmund Daniel, M.D. DD:  09/22/01 TD:  09/23/01 Job: [id number]

## 2011-04-18 ENCOUNTER — Other Ambulatory Visit: Payer: Self-pay | Admitting: Endocrinology

## 2011-04-26 ENCOUNTER — Other Ambulatory Visit: Payer: Self-pay | Admitting: Endocrinology

## 2011-04-26 DIAGNOSIS — E042 Nontoxic multinodular goiter: Secondary | ICD-10-CM

## 2011-05-17 ENCOUNTER — Ambulatory Visit (INDEPENDENT_AMBULATORY_CARE_PROVIDER_SITE_OTHER): Payer: Medicare Other | Admitting: General Surgery

## 2011-05-17 ENCOUNTER — Encounter (INDEPENDENT_AMBULATORY_CARE_PROVIDER_SITE_OTHER): Payer: Self-pay | Admitting: General Surgery

## 2011-05-17 VITALS — Temp 97.5°F

## 2011-05-17 DIAGNOSIS — D045 Carcinoma in situ of skin of trunk: Secondary | ICD-10-CM

## 2011-05-17 MED ORDER — NYSTATIN 100000 UNIT/GM EX POWD: CUTANEOUS | Status: AC

## 2011-05-17 NOTE — Progress Notes (Signed)
Subjective:     Patient ID: Peter Hall, male   DOB: 03-11-1924, 77 y.o.   MRN: [id number]    Temp(Src) 97.5 F (36.4 C) (Temporal)    HPI Patient has been given a prescription for Aldara. He used it for between one and 2 weeks on his perianal skin. He developed such severe burning and itching that he came to see [place] in urgent office. He was seen by Dr. Zachery Dakins who prescribed him Mycostatin powder. He has been applying this and irritation has improved significantly. He has also developed irritation in his gluteal region.  Review of Systems Otherwise negative    Objective:   Physical Exam  Constitutional: He is oriented to person, place, and time. He appears well-developed and well-nourished.  HENT:  Head: Normocephalic and atraumatic.  Mouth/Throat: No oropharyngeal exudate.  Eyes: Conjunctivae and EOM are normal. Pupils are equal, round, and reactive to light.  Pulmonary/Chest: Effort normal. No respiratory distress.  Abdominal: Soft.  Genitourinary:       Perianal irritation Papular lesions around anus, flat  Neurological: He is alert and oriented to person, place, and time.  Skin: Skin is warm and dry. No rash noted. No erythema. No pallor.  Psychiatric: He has a normal mood and affect. His behavior is normal. Judgment and thought content normal.       Assessment:         Plan:

## 2011-05-17 NOTE — Assessment & Plan Note (Signed)
Continue mycostatin powder. Follow up on 8/3 for recheck. Will likely do punch biopsy at that time.

## 2011-06-16 NOTE — Assessment & Plan Note (Addendum)
Punch biopsy performed.   Follow up in 3 weeks.

## 2011-06-16 NOTE — Progress Notes (Signed)
Peter Hall is a 77 y.o. male.    No chief complaint on file.   HPI HPI Pt says that perianal area is feeling better.  He still has horrible burning pain that starts in his buttock and extends down R leg.  He denies injury.  He does not feel any mas in that area.    No past medical history on file.  No past surgical history on file.  No family history on file.  Social History History  Substance Use Topics  . Smoking status: Former Games developer  . Smokeless tobacco: Not on file  . Alcohol Use: Not on file    No Known Allergies  Current Outpatient Prescriptions  Medication Sig Dispense Refill  . amLODipine-valsartan (EXFORGE) 5-320 MG per tablet Take 1 tablet by mouth daily.        Marland Kitchen levothyroxine (SYNTHROID, LEVOTHROID) 25 MCG tablet Take 25 mcg by mouth daily.        Marland Kitchen nystatin (MYCOSTATIN) powder Apply to affected area 3 times daily  60 g  4  . warfarin (COUMADIN) 5 MG tablet Take 5 mg by mouth daily. 1 1/2 pills mon wed, fri, 1 pill tues, thur, sat, sunday         Review of Systems ROS  Physical Exam Physical Exam  Vitals reviewed. Constitutional: He appears well-developed and well-nourished. No distress.  HENT:  Head: Normocephalic and atraumatic.  Eyes: Conjunctivae are normal. Pupils are equal, round, and reactive to light. No scleral icterus.  Neck: Normal range of motion. Neck supple.  GI: Soft. He exhibits no distension. There is no tenderness.  Genitourinary:          Bumpy areas around anus more pronounced on Left anterior aspect. 2 punch biopsies performed in this area.    Musculoskeletal: Normal range of motion. He exhibits no edema.  Neurological: He is alert. Coordination normal.  Skin: Skin is warm and dry. No rash noted. He is not diaphoretic. No erythema. No pallor.  Psychiatric: He has a normal mood and affect. His behavior is normal. Judgment and thought content normal.     There were no vitals taken for this  visit.  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      Peter Hall 06/16/2011, 4:39 PM

## 2011-06-17 ENCOUNTER — Other Ambulatory Visit (INDEPENDENT_AMBULATORY_CARE_PROVIDER_SITE_OTHER): Payer: Self-pay | Admitting: General Surgery

## 2011-06-17 ENCOUNTER — Ambulatory Visit (INDEPENDENT_AMBULATORY_CARE_PROVIDER_SITE_OTHER): Payer: Medicare Other | Admitting: General Surgery

## 2011-06-17 ENCOUNTER — Encounter (INDEPENDENT_AMBULATORY_CARE_PROVIDER_SITE_OTHER): Payer: Self-pay | Admitting: General Surgery

## 2011-06-17 DIAGNOSIS — M5431 Sciatica, right side: Secondary | ICD-10-CM | POA: Insufficient documentation

## 2011-06-17 DIAGNOSIS — M5430 Sciatica, unspecified side: Secondary | ICD-10-CM

## 2011-06-17 DIAGNOSIS — D045 Carcinoma in situ of skin of trunk: Secondary | ICD-10-CM

## 2011-06-17 NOTE — Assessment & Plan Note (Signed)
Refer to orthopaedics

## 2011-06-21 ENCOUNTER — Encounter (INDEPENDENT_AMBULATORY_CARE_PROVIDER_SITE_OTHER): Payer: Self-pay

## 2011-07-11 ENCOUNTER — Telehealth (INDEPENDENT_AMBULATORY_CARE_PROVIDER_SITE_OTHER): Payer: Self-pay | Admitting: General Surgery

## 2011-07-11 NOTE — Telephone Encounter (Signed)
Pt continues to have perianal burning during the day, but experiences sciatica at night with burning pain down the back of the leg.   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   Because of the difficulty with Aldara, he will need fulguration of the lesion in the operating room. I will need to see him back in clinic in the next 3-6 weeks to schedule.

## 2011-07-13 ENCOUNTER — Emergency Department (HOSPITAL_COMMUNITY)
Admission: EM | Admit: 2011-07-13 | Discharge: 2011-07-13 | Disposition: A | Discharge status: Home / Self Care | Payer: Medicare Other | Attending: Emergency Medicine | Admitting: Emergency Medicine

## 2011-07-13 ENCOUNTER — Telehealth (INDEPENDENT_AMBULATORY_CARE_PROVIDER_SITE_OTHER): Payer: Self-pay | Admitting: General Surgery

## 2011-07-13 DIAGNOSIS — E039 Hypothyroidism, unspecified: Secondary | ICD-10-CM | POA: Insufficient documentation

## 2011-07-13 DIAGNOSIS — M129 Arthropathy, unspecified: Secondary | ICD-10-CM | POA: Insufficient documentation

## 2011-07-13 DIAGNOSIS — L259 Unspecified contact dermatitis, unspecified cause: Secondary | ICD-10-CM | POA: Insufficient documentation

## 2011-07-13 DIAGNOSIS — Z79899 Other long term (current) drug therapy: Secondary | ICD-10-CM | POA: Insufficient documentation

## 2011-07-13 DIAGNOSIS — I10 Essential (primary) hypertension: Secondary | ICD-10-CM | POA: Insufficient documentation

## 2011-07-13 DIAGNOSIS — Z86718 Personal history of other venous thrombosis and embolism: Secondary | ICD-10-CM | POA: Insufficient documentation

## 2011-07-13 DIAGNOSIS — R21 Rash and other nonspecific skin eruption: Secondary | ICD-10-CM | POA: Insufficient documentation

## 2011-07-13 NOTE — Telephone Encounter (Signed)
Patient states still having itching and drainage from rectum and he is also looking for path results. I made appt for him to come in for her first available appt on 08/19/11. Please call him and move him sooner if possible.

## 2011-07-15 ENCOUNTER — Encounter (INDEPENDENT_AMBULATORY_CARE_PROVIDER_SITE_OTHER): Payer: Self-pay | Admitting: General Surgery

## 2011-07-15 ENCOUNTER — Ambulatory Visit (INDEPENDENT_AMBULATORY_CARE_PROVIDER_SITE_OTHER): Payer: Medicare Other | Admitting: General Surgery

## 2011-07-15 VITALS — BP 118/74 | HR 80 | Temp 96.8°F | Ht 66.0 in | Wt 196.4 lb

## 2011-07-15 DIAGNOSIS — R21 Rash and other nonspecific skin eruption: Secondary | ICD-10-CM

## 2011-07-15 NOTE — Patient Instructions (Signed)
Keep appt with Dr Donell Beers

## 2011-07-15 NOTE — Progress Notes (Signed)
Patient comes to the office today complaining of worsening rash on his scrotum. This apparently has been present for almost 2 years and states that he is seen a number of doctors about this. The recently underwent biopsy of a perianal lesion by Dr. Donell Beers which apparently has revealed carcinoma in situ and followup is planned. He has an appointment to see Dr. Donell Beers October 5.  After the biopsy he had seen Dr. Zachery Dakins in the urgent office and had had Mycostatin powder prescribed. He has been using this and lidocaine ointment and he states it is getting worse.  On examination over about half of the scrotal skin in a patchy distribution is what appears to be some almost full thickness skin loss with some exudate and underlying beefy-red tissue. This does not appear to be a mass.  Assessment and plan: Patient has carcinoma in situ of the anus but is here today due to 2 persistent and worsening scrotal skin rash and breakdown. He is getting worse with the lidocaine and nystatin and I told her to stop this. I would think a dermatology evaluation might be most appropriate. 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 Due to some possible superficial  infection we will use Silvadene cream for now. A followup with his PCP and Dr. Donell Beers.

## 2011-08-23 ENCOUNTER — Encounter (INDEPENDENT_AMBULATORY_CARE_PROVIDER_SITE_OTHER): Payer: Self-pay | Admitting: General Surgery

## 2011-08-23 ENCOUNTER — Ambulatory Visit (INDEPENDENT_AMBULATORY_CARE_PROVIDER_SITE_OTHER): Payer: Medicare Other | Admitting: General Surgery

## 2011-08-23 VITALS — BP 118/62 | HR 60 | Temp 96.4°F | Resp 14 | Ht 72.0 in | Wt 200.4 lb

## 2011-08-23 DIAGNOSIS — D045 Carcinoma in situ of skin of trunk: Secondary | ICD-10-CM

## 2011-08-23 MED ORDER — SILVER SULFADIAZINE 1 % EX CREA: TOPICAL_CREAM | CUTANEOUS | Status: DC

## 2011-08-23 NOTE — Progress Notes (Signed)
HISTORY: Pt has a history of anal condyloma.  We tried Aldara, but he could not tolerate it.  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  He has tried many creams and ointments, but continues to have itching and burning in this area.    PERTINENT REVIEW OF SYSTEMS: Otherwise negative.     EXAM: Head: Normocephalic and atraumatic.  Eyes:  Conjunctivae are normal. Pupils are equal, round, and reactive to light. No scleral icterus.  Neck:  Normal range of motion. Neck supple. No tracheal deviation present. No thyromegaly present.  Resp: No respiratory distress, normal effort. Neurological: Alert and oriented to person, place, and time.  Antalgic gait.    Perianal exam:  Condyloma are more prominent.  These are primarily on the left side.   Skin: Skin is warm and dry. No rash noted. No diaphoretic. No erythema. No pallor.  Psychiatric: Normal mood and affect. Normal behavior. Judgment and thought content normal.     Pathology reviewed and demonstrates: High grade dysplasia of anal margin.  ASSESSMENT AND PLAN:   Carcinoma in situ of anal margin Condyloma more prominent. Plan fulguration of anal condyloma. Discussed post op recovery and discomfort.   Reviewed sitz baths and silvadene treatment. Will get biopsies of the areas as well.       Maudry Diego, MD Surgical Oncology, General & Endocrine Surgery [facility]  Michiel Sites, MD Juleen China Rolan Bucco, MD

## 2011-08-23 NOTE — Assessment & Plan Note (Signed)
Condyloma more prominent. Plan fulguration of anal condyloma. Discussed post op recovery and discomfort.   Reviewed sitz baths and silvadene treatment. Will get biopsies of the areas as well.

## 2011-08-23 NOTE — Patient Instructions (Signed)
Plan fulguration of warts.   Obtain sitz bath for toilet.   Check silvadene script.

## 2011-08-24 ENCOUNTER — Encounter (HOSPITAL_COMMUNITY)
Admission: RE | Admit: 2011-08-24 | Discharge: 2011-08-24 | Disposition: A | Discharge status: Home / Self Care | Payer: Medicare Other | Source: Ambulatory Visit | Attending: General Surgery | Admitting: General Surgery

## 2011-08-24 LAB — BASIC METABOLIC PANEL
BUN: 26 mg/dL — ABNORMAL HIGH (ref 6–23)
CO2: 28 mEq/L (ref 19–32)
Calcium: 9.2 mg/dL (ref 8.4–10.5)
Chloride: 112 mEq/L (ref 96–112)
Creatinine, Ser: 1.26 mg/dL (ref 0.50–1.35)
GFR calc Af Amer: 58 mL/min — ABNORMAL LOW (ref >90)
GFR calc non Af Amer: 50 mL/min — ABNORMAL LOW (ref >90)
Glucose, Bld: 83 mg/dL (ref 70–99)
Potassium: 4.3 mEq/L (ref 3.5–5.1)
Sodium: 147 mEq/L — ABNORMAL HIGH (ref 135–145)

## 2011-08-24 LAB — SURGICAL PCR SCREEN
MRSA, PCR: NEGATIVE
Staphylococcus aureus: NEGATIVE

## 2011-08-24 LAB — APTT: aPTT: 31 seconds (ref 24–37)

## 2011-08-24 LAB — CBC
HCT: 34.9 % — ABNORMAL LOW (ref 39.0–52.0)
Hemoglobin: 12.5 g/dL — ABNORMAL LOW (ref 13.0–17.0)
MCH: 30.7 pg (ref 26.0–34.0)
MCHC: 35.8 g/dL (ref 30.0–36.0)
MCV: 85.7 fL (ref 78.0–100.0)
Platelets: 167 10*3/uL (ref 150–400)
RBC: 4.07 MIL/uL — ABNORMAL LOW (ref 4.22–5.81)
RDW: 15.6 % — ABNORMAL HIGH (ref 11.5–15.5)
WBC: 7.5 10*3/uL (ref 4.0–10.5)

## 2011-08-24 LAB — PROTIME-INR
INR: 1.4 (ref 0.00–1.49)
Prothrombin Time: 17.4 seconds — ABNORMAL HIGH (ref 11.6–15.2)

## 2011-08-24 NOTE — Progress Notes (Signed)
Quick Note:  Labs ok for surgery ______ 

## 2011-08-25 ENCOUNTER — Other Ambulatory Visit (INDEPENDENT_AMBULATORY_CARE_PROVIDER_SITE_OTHER): Payer: Self-pay | Admitting: General Surgery

## 2011-08-25 ENCOUNTER — Ambulatory Visit (HOSPITAL_COMMUNITY)
Admission: RE | Admit: 2011-08-25 | Discharge: 2011-08-25 | Disposition: A | Discharge status: Home / Self Care | Payer: Medicare Other | Source: Ambulatory Visit | Attending: General Surgery | Admitting: General Surgery

## 2011-08-25 DIAGNOSIS — K6282 Dysplasia of anus: Secondary | ICD-10-CM

## 2011-08-25 DIAGNOSIS — D045 Carcinoma in situ of skin of trunk: Secondary | ICD-10-CM | POA: Insufficient documentation

## 2011-08-25 DIAGNOSIS — Z01812 Encounter for preprocedural laboratory examination: Secondary | ICD-10-CM | POA: Insufficient documentation

## 2011-08-25 DIAGNOSIS — Z01810 Encounter for preprocedural cardiovascular examination: Secondary | ICD-10-CM | POA: Insufficient documentation

## 2011-08-25 DIAGNOSIS — K648 Other hemorrhoids: Secondary | ICD-10-CM | POA: Insufficient documentation

## 2011-08-25 DIAGNOSIS — A630 Anogenital (venereal) warts: Secondary | ICD-10-CM

## 2011-08-25 HISTORY — PX: ANUS SURGERY: SHX302

## 2011-08-25 NOTE — Progress Notes (Signed)
Quick Note:  Labs ok for surgery ______ 

## 2011-08-30 NOTE — Op Note (Signed)
  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  PROCEDURE:  Exam under anesthesia, biopsies, and fulguration of anal condyloma.  SURGEON:  Almond Lint, MD  ANESTHESIA:  General and extradural.  FINDINGS:  Flat anal condylomata on the left side, very superficial internal hemorrhoids.  Punch biopsies taken at 12 o'clock, 3 o'clock, 6 o'clock, 9 o'clock, and 4 o'clock and 2 o'clock for pathology.  ESTIMATED BLOOD LOSS:  Minimal.  COMPLICATIONS:  None known.  PROCEDURE IN DETAIL:  Mr. Hubbert was identified in the holding area and taken to the operating room, he was placed on operating room table. General anesthesia was induced.  He was placed into the lithotomy position.  His perineum was prepped and draped in standard fashion. Time-out was performed according to the surgical safety checklist.  When all was correct, we continued.  The anus was examined first visually, digitally with anoscope.  In the anal canal, internal hemorrhoids were seen, but there was no evidence of condyloma.  At the anal margin, there is a flat, whitish discoloration on the left side that is firm in nature.  This was biopsied at 2, 3, and 4 o'clock as well as 6 o'clock. Anteriorly and on the right side this was not as evident that biopsies were done at 9 o'clock and 12 o'clock anyway.  At this point, the flat area of condyloma was fulgurated with cautery.  The high-power suction was used to aspirate particles associated with this.   Hemostasis was achieved with the cautery.  Extradural was injected circumferentially around the anus for postoperative pain control.  The patient was awakened from anesthesia and taken to PACU in stable condition.  Needle, sponge, and instrument counts were correct x2.     Almond Lint, MD    FB/MEDQ  D:  08/25/2011  T:  08/25/2011  Job:  [id number]  Electronically Signed by Almond Lint MD on 08/30/2011 08:05:19 PM

## 2011-09-01 ENCOUNTER — Other Ambulatory Visit (INDEPENDENT_AMBULATORY_CARE_PROVIDER_SITE_OTHER): Payer: Self-pay

## 2011-09-01 MED ORDER — DIBUCAINE 1 % EX OINT: TOPICAL_OINTMENT | Freq: Three times a day (TID) | CUTANEOUS | Status: AC | PRN

## 2011-09-19 ENCOUNTER — Ambulatory Visit (INDEPENDENT_AMBULATORY_CARE_PROVIDER_SITE_OTHER): Payer: Medicare Other | Admitting: General Surgery

## 2011-09-19 ENCOUNTER — Encounter (INDEPENDENT_AMBULATORY_CARE_PROVIDER_SITE_OTHER): Payer: Self-pay | Admitting: General Surgery

## 2011-09-19 VITALS — BP 120/64 | HR 76 | Temp 98.0°F | Resp 20 | Ht 72.0 in | Wt 201.2 lb

## 2011-09-19 DIAGNOSIS — D045 Carcinoma in situ of skin of trunk: Secondary | ICD-10-CM

## 2011-09-19 NOTE — Patient Instructions (Signed)
Continue sitz baths as needed after bowel movements and PRN. Follow up in 3 months unless changes occur.

## 2011-09-19 NOTE — Assessment & Plan Note (Signed)
Pt now 4 weeks after fulguration of perianal dysplasia. Doing well. Follow up in 3 months. Continue sitz baths as needed.

## 2011-09-19 NOTE — Progress Notes (Signed)
HISTORY: Pt is 4 weeks s/p fulguration of anal dysplasia.  He has not had Gemini condyloma, but has had itching, burning, and scaling of the perianal skin.  He was unable to tolerate imiquimod, and had carcinoma in situ on prior biopsies.    He is doing quite well since surgery.  He is not having any pain, and the burning has resolved.  He feels much better than before surgery.  He is not having any bleeding, and he is continuing his sitz baths.     PERTINENT REVIEW OF SYSTEMS: Negative.   EXAM: Head: Normocephalic and atraumatic.  Eyes:  Conjunctivae are normal. Pupils are equal, round, and reactive to light. No scleral icterus.  Resp: No respiratory distress, normal effort. Rectal:  Perianal skin healing appropriately.  Mucous over the healing area.  No evidence of recurrent lesions.   Neurological: Alert and oriented to person, place, and time. Sl antalgic gait.  Skin: Skin is warm and dry. No rash noted. No diaphoretic. No erythema. No pallor.  Psychiatric: Normal mood and affect. Normal behavior. Judgment and thought content normal.     Pathology reviewed and demonstrates: AIN 2 (high grade dysplasia) of anal margin  ASSESSMENT AND PLAN:   Carcinoma in situ of anal margin Pt now 4 weeks after fulguration of perianal dysplasia. Doing well. Follow up in 3 months. Continue sitz baths as needed.         Maudry Diego, MD Surgical Oncology, General & Endocrine Surgery [facility]  Michiel Sites, MD Juleen China Rolan Bucco, MD

## 2011-10-31 ENCOUNTER — Ambulatory Visit
Admission: RE | Admit: 2011-10-31 | Discharge: 2011-10-31 | Disposition: A | Discharge status: Home / Self Care | Payer: Medicare Other | Source: Ambulatory Visit | Attending: Endocrinology | Admitting: Endocrinology

## 2011-10-31 DIAGNOSIS — E042 Nontoxic multinodular goiter: Secondary | ICD-10-CM

## 2011-12-06 DIAGNOSIS — H40019 Open angle with borderline findings, low risk, unspecified eye: Secondary | ICD-10-CM | POA: Diagnosis not present

## 2011-12-06 DIAGNOSIS — H35369 Drusen (degenerative) of macula, unspecified eye: Secondary | ICD-10-CM | POA: Diagnosis not present

## 2011-12-19 DIAGNOSIS — Z7901 Long term (current) use of anticoagulants: Secondary | ICD-10-CM | POA: Diagnosis not present

## 2011-12-19 DIAGNOSIS — I2699 Other pulmonary embolism without acute cor pulmonale: Secondary | ICD-10-CM | POA: Diagnosis not present

## 2011-12-28 DIAGNOSIS — IMO0002 Reserved for concepts with insufficient information to code with codable children: Secondary | ICD-10-CM | POA: Diagnosis not present

## 2011-12-28 DIAGNOSIS — M25569 Pain in unspecified knee: Secondary | ICD-10-CM | POA: Diagnosis not present

## 2011-12-28 DIAGNOSIS — M1710 Unilateral primary osteoarthritis, unspecified knee: Secondary | ICD-10-CM | POA: Diagnosis not present

## 2011-12-29 DIAGNOSIS — N400 Enlarged prostate without lower urinary tract symptoms: Secondary | ICD-10-CM | POA: Diagnosis not present

## 2011-12-29 DIAGNOSIS — I10 Essential (primary) hypertension: Secondary | ICD-10-CM | POA: Diagnosis not present

## 2011-12-29 DIAGNOSIS — E0789 Other specified disorders of thyroid: Secondary | ICD-10-CM | POA: Diagnosis not present

## 2012-01-02 ENCOUNTER — Ambulatory Visit (INDEPENDENT_AMBULATORY_CARE_PROVIDER_SITE_OTHER): Payer: Medicare Other | Admitting: General Surgery

## 2012-01-02 ENCOUNTER — Encounter (INDEPENDENT_AMBULATORY_CARE_PROVIDER_SITE_OTHER): Payer: Self-pay | Admitting: General Surgery

## 2012-01-02 VITALS — BP 142/82 | HR 70 | Temp 98.3°F | Resp 18 | Ht 72.0 in | Wt 208.2 lb

## 2012-01-02 DIAGNOSIS — D045 Carcinoma in situ of skin of trunk: Secondary | ICD-10-CM

## 2012-01-02 DIAGNOSIS — G4730 Sleep apnea, unspecified: Secondary | ICD-10-CM | POA: Diagnosis not present

## 2012-01-02 DIAGNOSIS — G4710 Hypersomnia, unspecified: Secondary | ICD-10-CM | POA: Diagnosis not present

## 2012-01-02 NOTE — Patient Instructions (Signed)
Use sitz baths or squirt bottle to clean anus after bowel movements to decrease trauma of wiping.    Follow up in 3 months.  Ok to use vaseline or desitin.

## 2012-01-02 NOTE — Progress Notes (Signed)
HISTORY: Pt is s/p fulguration of anal margin carcinoma in situ.  He is still having burning around anus and down back of right leg.  The burning predated the surgery.  He has not noticed any other lesions.  He has been placing vaseline around anus.     PERTINENT REVIEW OF SYSTEMS: Otherwise negative.     EXAM: Head: Normocephalic and atraumatic.  Eyes:  Conjunctivae are normal. Pupils are equal, round, and reactive to light. No scleral icterus.  Neck:  Normal range of motion. Neck supple. Marland Kitchen  Resp: No respiratory distress, normal effort. Rectal:  No evidence of perianal lesions.  No skin tears.  Small amount of fecal soiling.    Neurological: Alert and oriented to person, place, and time. Uses cane.  Skin: Skin is warm and dry. No rash noted. No diaphoretic. No erythema. No pallor.  Psychiatric: Normal mood and affect. Normal behavior. Judgment and thought content normal.      ASSESSMENT AND PLAN:   Carcinoma in situ of anal margin Pt with no evidence of recurrent carcinoma in situ.  Will follow up in 3 months.  Pt advised to go back to see physician who has been evaluating his back to discuss sciatica type symptoms.    Unsure why he is still feeling burning sensation around anus.         Maudry Diego, MD Surgical Oncology, General & Endocrine Surgery [facility]  Michiel Sites, MD, MD Juleen China Rolan Bucco, MD

## 2012-01-02 NOTE — Assessment & Plan Note (Signed)
Pt with no evidence of recurrent carcinoma in situ.  Will follow up in 3 months.  Pt advised to go back to see physician who has been evaluating his back to discuss sciatica type symptoms.    Unsure why he is still feeling burning sensation around anus.

## 2012-01-05 DIAGNOSIS — M5430 Sciatica, unspecified side: Secondary | ICD-10-CM | POA: Diagnosis not present

## 2012-01-11 ENCOUNTER — Ambulatory Visit: Payer: Medicare Other | Attending: Orthopedic Surgery | Admitting: Physical Therapy

## 2012-01-11 DIAGNOSIS — M2569 Stiffness of other specified joint, not elsewhere classified: Secondary | ICD-10-CM | POA: Insufficient documentation

## 2012-01-11 DIAGNOSIS — M25559 Pain in unspecified hip: Secondary | ICD-10-CM | POA: Insufficient documentation

## 2012-01-11 DIAGNOSIS — IMO0001 Reserved for inherently not codable concepts without codable children: Secondary | ICD-10-CM | POA: Insufficient documentation

## 2012-01-11 DIAGNOSIS — M545 Low back pain, unspecified: Secondary | ICD-10-CM | POA: Insufficient documentation

## 2012-01-13 ENCOUNTER — Ambulatory Visit: Payer: Medicare Other | Attending: Orthopedic Surgery | Admitting: Physical Therapy

## 2012-01-13 DIAGNOSIS — M545 Low back pain, unspecified: Secondary | ICD-10-CM | POA: Insufficient documentation

## 2012-01-13 DIAGNOSIS — M25559 Pain in unspecified hip: Secondary | ICD-10-CM | POA: Diagnosis not present

## 2012-01-13 DIAGNOSIS — M2569 Stiffness of other specified joint, not elsewhere classified: Secondary | ICD-10-CM | POA: Insufficient documentation

## 2012-01-13 DIAGNOSIS — IMO0001 Reserved for inherently not codable concepts without codable children: Secondary | ICD-10-CM | POA: Insufficient documentation

## 2012-01-16 ENCOUNTER — Ambulatory Visit: Payer: Medicare Other | Admitting: Physical Therapy

## 2012-01-16 DIAGNOSIS — I82890 Acute embolism and thrombosis of other specified veins: Secondary | ICD-10-CM | POA: Diagnosis not present

## 2012-01-16 DIAGNOSIS — Z7901 Long term (current) use of anticoagulants: Secondary | ICD-10-CM | POA: Diagnosis not present

## 2012-01-16 DIAGNOSIS — I2699 Other pulmonary embolism without acute cor pulmonale: Secondary | ICD-10-CM | POA: Diagnosis not present

## 2012-01-20 ENCOUNTER — Ambulatory Visit: Payer: Medicare Other | Admitting: Physical Therapy

## 2012-01-23 ENCOUNTER — Ambulatory Visit: Payer: Medicare Other | Admitting: Physical Therapy

## 2012-01-27 ENCOUNTER — Ambulatory Visit: Payer: Medicare Other | Admitting: Physical Therapy

## 2012-01-30 ENCOUNTER — Ambulatory Visit: Payer: Medicare Other | Admitting: Physical Therapy

## 2012-02-03 ENCOUNTER — Ambulatory Visit: Payer: Medicare Other | Admitting: Physical Therapy

## 2012-02-07 ENCOUNTER — Ambulatory Visit: Payer: Medicare Other | Admitting: Physical Therapy

## 2012-02-13 ENCOUNTER — Ambulatory Visit: Payer: Medicare Other | Attending: Orthopedic Surgery | Admitting: Physical Therapy

## 2012-02-13 DIAGNOSIS — M2569 Stiffness of other specified joint, not elsewhere classified: Secondary | ICD-10-CM | POA: Diagnosis not present

## 2012-02-13 DIAGNOSIS — M25559 Pain in unspecified hip: Secondary | ICD-10-CM | POA: Diagnosis not present

## 2012-02-13 DIAGNOSIS — M545 Low back pain, unspecified: Secondary | ICD-10-CM | POA: Diagnosis not present

## 2012-02-13 DIAGNOSIS — IMO0001 Reserved for inherently not codable concepts without codable children: Secondary | ICD-10-CM | POA: Diagnosis not present

## 2012-02-16 DIAGNOSIS — I2699 Other pulmonary embolism without acute cor pulmonale: Secondary | ICD-10-CM | POA: Diagnosis not present

## 2012-02-16 DIAGNOSIS — Z7901 Long term (current) use of anticoagulants: Secondary | ICD-10-CM | POA: Diagnosis not present

## 2012-02-16 DIAGNOSIS — I824Y9 Acute embolism and thrombosis of unspecified deep veins of unspecified proximal lower extremity: Secondary | ICD-10-CM | POA: Diagnosis not present

## 2012-02-17 ENCOUNTER — Ambulatory Visit: Payer: Medicare Other | Admitting: Physical Therapy

## 2012-02-20 DIAGNOSIS — I10 Essential (primary) hypertension: Secondary | ICD-10-CM | POA: Diagnosis not present

## 2012-02-20 DIAGNOSIS — M109 Gout, unspecified: Secondary | ICD-10-CM | POA: Diagnosis not present

## 2012-02-20 DIAGNOSIS — E0789 Other specified disorders of thyroid: Secondary | ICD-10-CM | POA: Diagnosis not present

## 2012-02-20 DIAGNOSIS — G4733 Obstructive sleep apnea (adult) (pediatric): Secondary | ICD-10-CM | POA: Diagnosis not present

## 2012-02-21 ENCOUNTER — Ambulatory Visit: Payer: Medicare Other | Admitting: Physical Therapy

## 2012-02-21 ENCOUNTER — Encounter (INDEPENDENT_AMBULATORY_CARE_PROVIDER_SITE_OTHER): Payer: Medicare Other | Admitting: General Surgery

## 2012-02-22 DIAGNOSIS — IMO0002 Reserved for concepts with insufficient information to code with codable children: Secondary | ICD-10-CM | POA: Diagnosis not present

## 2012-02-22 DIAGNOSIS — M25569 Pain in unspecified knee: Secondary | ICD-10-CM | POA: Diagnosis not present

## 2012-02-22 DIAGNOSIS — M1710 Unilateral primary osteoarthritis, unspecified knee: Secondary | ICD-10-CM | POA: Diagnosis not present

## 2012-02-23 ENCOUNTER — Ambulatory Visit: Payer: Medicare Other | Admitting: Physical Therapy

## 2012-02-27 ENCOUNTER — Ambulatory Visit: Payer: Medicare Other | Admitting: Physical Therapy

## 2012-03-02 ENCOUNTER — Ambulatory Visit: Payer: Medicare Other | Admitting: Physical Therapy

## 2012-03-05 DIAGNOSIS — N401 Enlarged prostate with lower urinary tract symptoms: Secondary | ICD-10-CM | POA: Diagnosis not present

## 2012-03-05 DIAGNOSIS — N138 Other obstructive and reflux uropathy: Secondary | ICD-10-CM | POA: Diagnosis not present

## 2012-03-06 ENCOUNTER — Ambulatory Visit: Payer: Medicare Other | Admitting: Physical Therapy

## 2012-03-09 ENCOUNTER — Ambulatory Visit: Payer: Medicare Other | Admitting: Physical Therapy

## 2012-03-19 DIAGNOSIS — Z7901 Long term (current) use of anticoagulants: Secondary | ICD-10-CM | POA: Diagnosis not present

## 2012-03-19 DIAGNOSIS — I824Y9 Acute embolism and thrombosis of unspecified deep veins of unspecified proximal lower extremity: Secondary | ICD-10-CM | POA: Diagnosis not present

## 2012-03-19 DIAGNOSIS — I2699 Other pulmonary embolism without acute cor pulmonale: Secondary | ICD-10-CM | POA: Diagnosis not present

## 2012-03-26 DIAGNOSIS — Z125 Encounter for screening for malignant neoplasm of prostate: Secondary | ICD-10-CM | POA: Diagnosis not present

## 2012-03-26 DIAGNOSIS — R0602 Shortness of breath: Secondary | ICD-10-CM | POA: Diagnosis not present

## 2012-03-26 DIAGNOSIS — R0989 Other specified symptoms and signs involving the circulatory and respiratory systems: Secondary | ICD-10-CM | POA: Diagnosis not present

## 2012-03-26 DIAGNOSIS — E0789 Other specified disorders of thyroid: Secondary | ICD-10-CM | POA: Diagnosis not present

## 2012-03-26 DIAGNOSIS — R0609 Other forms of dyspnea: Secondary | ICD-10-CM | POA: Diagnosis not present

## 2012-03-26 DIAGNOSIS — I10 Essential (primary) hypertension: Secondary | ICD-10-CM | POA: Diagnosis not present

## 2012-03-27 DIAGNOSIS — G4733 Obstructive sleep apnea (adult) (pediatric): Secondary | ICD-10-CM | POA: Diagnosis not present

## 2012-04-02 DIAGNOSIS — L299 Pruritus, unspecified: Secondary | ICD-10-CM | POA: Diagnosis not present

## 2012-04-02 DIAGNOSIS — Z85828 Personal history of other malignant neoplasm of skin: Secondary | ICD-10-CM | POA: Diagnosis not present

## 2012-04-02 DIAGNOSIS — D048 Carcinoma in situ of skin of other sites: Secondary | ICD-10-CM | POA: Diagnosis not present

## 2012-04-02 DIAGNOSIS — N400 Enlarged prostate without lower urinary tract symptoms: Secondary | ICD-10-CM | POA: Diagnosis not present

## 2012-04-02 DIAGNOSIS — L290 Pruritus ani: Secondary | ICD-10-CM | POA: Diagnosis not present

## 2012-04-02 DIAGNOSIS — L503 Dermatographic urticaria: Secondary | ICD-10-CM | POA: Diagnosis not present

## 2012-04-03 ENCOUNTER — Other Ambulatory Visit: Payer: Self-pay | Admitting: Endocrinology

## 2012-04-03 DIAGNOSIS — N400 Enlarged prostate without lower urinary tract symptoms: Secondary | ICD-10-CM | POA: Diagnosis not present

## 2012-04-03 DIAGNOSIS — E0789 Other specified disorders of thyroid: Secondary | ICD-10-CM | POA: Diagnosis not present

## 2012-04-03 DIAGNOSIS — I10 Essential (primary) hypertension: Secondary | ICD-10-CM | POA: Diagnosis not present

## 2012-04-03 DIAGNOSIS — M109 Gout, unspecified: Secondary | ICD-10-CM | POA: Diagnosis not present

## 2012-04-03 DIAGNOSIS — E041 Nontoxic single thyroid nodule: Secondary | ICD-10-CM

## 2012-04-12 DIAGNOSIS — M1710 Unilateral primary osteoarthritis, unspecified knee: Secondary | ICD-10-CM | POA: Diagnosis not present

## 2012-04-12 DIAGNOSIS — R609 Edema, unspecified: Secondary | ICD-10-CM | POA: Diagnosis not present

## 2012-04-12 DIAGNOSIS — IMO0002 Reserved for concepts with insufficient information to code with codable children: Secondary | ICD-10-CM | POA: Diagnosis not present

## 2012-04-19 DIAGNOSIS — Z7901 Long term (current) use of anticoagulants: Secondary | ICD-10-CM | POA: Diagnosis not present

## 2012-04-19 DIAGNOSIS — I2699 Other pulmonary embolism without acute cor pulmonale: Secondary | ICD-10-CM | POA: Diagnosis not present

## 2012-04-19 DIAGNOSIS — I824Y9 Acute embolism and thrombosis of unspecified deep veins of unspecified proximal lower extremity: Secondary | ICD-10-CM | POA: Diagnosis not present

## 2012-04-19 DIAGNOSIS — I10 Essential (primary) hypertension: Secondary | ICD-10-CM | POA: Diagnosis not present

## 2012-05-03 DIAGNOSIS — Z7901 Long term (current) use of anticoagulants: Secondary | ICD-10-CM | POA: Diagnosis not present

## 2012-05-03 DIAGNOSIS — I824Y9 Acute embolism and thrombosis of unspecified deep veins of unspecified proximal lower extremity: Secondary | ICD-10-CM | POA: Diagnosis not present

## 2012-05-03 DIAGNOSIS — I2699 Other pulmonary embolism without acute cor pulmonale: Secondary | ICD-10-CM | POA: Diagnosis not present

## 2012-05-18 DIAGNOSIS — Z9889 Other specified postprocedural states: Secondary | ICD-10-CM | POA: Diagnosis not present

## 2012-05-18 DIAGNOSIS — IMO0002 Reserved for concepts with insufficient information to code with codable children: Secondary | ICD-10-CM | POA: Diagnosis not present

## 2012-05-18 DIAGNOSIS — L259 Unspecified contact dermatitis, unspecified cause: Secondary | ICD-10-CM | POA: Diagnosis not present

## 2012-05-18 DIAGNOSIS — G589 Mononeuropathy, unspecified: Secondary | ICD-10-CM | POA: Diagnosis not present

## 2012-05-30 DIAGNOSIS — L299 Pruritus, unspecified: Secondary | ICD-10-CM | POA: Diagnosis not present

## 2012-05-30 DIAGNOSIS — R609 Edema, unspecified: Secondary | ICD-10-CM | POA: Diagnosis not present

## 2012-05-30 DIAGNOSIS — L259 Unspecified contact dermatitis, unspecified cause: Secondary | ICD-10-CM | POA: Diagnosis not present

## 2012-05-30 DIAGNOSIS — Z85828 Personal history of other malignant neoplasm of skin: Secondary | ICD-10-CM | POA: Diagnosis not present

## 2012-05-31 DIAGNOSIS — I824Y9 Acute embolism and thrombosis of unspecified deep veins of unspecified proximal lower extremity: Secondary | ICD-10-CM | POA: Diagnosis not present

## 2012-05-31 DIAGNOSIS — I2699 Other pulmonary embolism without acute cor pulmonale: Secondary | ICD-10-CM | POA: Diagnosis not present

## 2012-05-31 DIAGNOSIS — I10 Essential (primary) hypertension: Secondary | ICD-10-CM | POA: Diagnosis not present

## 2012-05-31 DIAGNOSIS — Z7901 Long term (current) use of anticoagulants: Secondary | ICD-10-CM | POA: Diagnosis not present

## 2012-06-26 DIAGNOSIS — M1710 Unilateral primary osteoarthritis, unspecified knee: Secondary | ICD-10-CM | POA: Diagnosis not present

## 2012-06-26 DIAGNOSIS — M25569 Pain in unspecified knee: Secondary | ICD-10-CM | POA: Diagnosis not present

## 2012-06-26 DIAGNOSIS — IMO0002 Reserved for concepts with insufficient information to code with codable children: Secondary | ICD-10-CM | POA: Diagnosis not present

## 2012-07-02 DIAGNOSIS — I10 Essential (primary) hypertension: Secondary | ICD-10-CM | POA: Diagnosis not present

## 2012-07-02 DIAGNOSIS — Z7901 Long term (current) use of anticoagulants: Secondary | ICD-10-CM | POA: Diagnosis not present

## 2012-07-02 DIAGNOSIS — I824Y9 Acute embolism and thrombosis of unspecified deep veins of unspecified proximal lower extremity: Secondary | ICD-10-CM | POA: Diagnosis not present

## 2012-07-02 DIAGNOSIS — I2699 Other pulmonary embolism without acute cor pulmonale: Secondary | ICD-10-CM | POA: Diagnosis not present

## 2012-07-04 DIAGNOSIS — G5790 Unspecified mononeuropathy of unspecified lower limb: Secondary | ICD-10-CM | POA: Diagnosis not present

## 2012-07-04 DIAGNOSIS — R209 Unspecified disturbances of skin sensation: Secondary | ICD-10-CM | POA: Diagnosis not present

## 2012-07-04 DIAGNOSIS — Z85828 Personal history of other malignant neoplasm of skin: Secondary | ICD-10-CM | POA: Diagnosis not present

## 2012-08-02 DIAGNOSIS — I10 Essential (primary) hypertension: Secondary | ICD-10-CM | POA: Diagnosis not present

## 2012-08-02 DIAGNOSIS — I824Y9 Acute embolism and thrombosis of unspecified deep veins of unspecified proximal lower extremity: Secondary | ICD-10-CM | POA: Diagnosis not present

## 2012-08-02 DIAGNOSIS — Z7901 Long term (current) use of anticoagulants: Secondary | ICD-10-CM | POA: Diagnosis not present

## 2012-08-02 DIAGNOSIS — I2699 Other pulmonary embolism without acute cor pulmonale: Secondary | ICD-10-CM | POA: Diagnosis not present

## 2012-08-20 DIAGNOSIS — M1710 Unilateral primary osteoarthritis, unspecified knee: Secondary | ICD-10-CM | POA: Diagnosis not present

## 2012-08-20 DIAGNOSIS — Z23 Encounter for immunization: Secondary | ICD-10-CM | POA: Diagnosis not present

## 2012-08-20 DIAGNOSIS — IMO0002 Reserved for concepts with insufficient information to code with codable children: Secondary | ICD-10-CM | POA: Diagnosis not present

## 2012-08-20 DIAGNOSIS — M25569 Pain in unspecified knee: Secondary | ICD-10-CM | POA: Diagnosis not present

## 2012-09-05 DIAGNOSIS — R209 Unspecified disturbances of skin sensation: Secondary | ICD-10-CM | POA: Diagnosis not present

## 2012-09-05 DIAGNOSIS — IMO0002 Reserved for concepts with insufficient information to code with codable children: Secondary | ICD-10-CM | POA: Diagnosis not present

## 2012-09-05 DIAGNOSIS — R609 Edema, unspecified: Secondary | ICD-10-CM | POA: Diagnosis not present

## 2012-09-06 DIAGNOSIS — I2699 Other pulmonary embolism without acute cor pulmonale: Secondary | ICD-10-CM | POA: Diagnosis not present

## 2012-09-06 DIAGNOSIS — I824Y9 Acute embolism and thrombosis of unspecified deep veins of unspecified proximal lower extremity: Secondary | ICD-10-CM | POA: Diagnosis not present

## 2012-09-06 DIAGNOSIS — Z7901 Long term (current) use of anticoagulants: Secondary | ICD-10-CM | POA: Diagnosis not present

## 2012-09-06 DIAGNOSIS — I10 Essential (primary) hypertension: Secondary | ICD-10-CM | POA: Diagnosis not present

## 2012-09-18 DIAGNOSIS — I2699 Other pulmonary embolism without acute cor pulmonale: Secondary | ICD-10-CM | POA: Diagnosis not present

## 2012-09-18 DIAGNOSIS — I10 Essential (primary) hypertension: Secondary | ICD-10-CM | POA: Diagnosis not present

## 2012-09-18 DIAGNOSIS — I824Y9 Acute embolism and thrombosis of unspecified deep veins of unspecified proximal lower extremity: Secondary | ICD-10-CM | POA: Diagnosis not present

## 2012-09-18 DIAGNOSIS — Z7901 Long term (current) use of anticoagulants: Secondary | ICD-10-CM | POA: Diagnosis not present

## 2012-09-27 DIAGNOSIS — E119 Type 2 diabetes mellitus without complications: Secondary | ICD-10-CM | POA: Diagnosis not present

## 2012-09-27 DIAGNOSIS — E0789 Other specified disorders of thyroid: Secondary | ICD-10-CM | POA: Diagnosis not present

## 2012-09-27 DIAGNOSIS — I10 Essential (primary) hypertension: Secondary | ICD-10-CM | POA: Diagnosis not present

## 2012-10-04 DIAGNOSIS — E0789 Other specified disorders of thyroid: Secondary | ICD-10-CM | POA: Diagnosis not present

## 2012-10-04 DIAGNOSIS — I10 Essential (primary) hypertension: Secondary | ICD-10-CM | POA: Diagnosis not present

## 2012-10-04 DIAGNOSIS — IMO0002 Reserved for concepts with insufficient information to code with codable children: Secondary | ICD-10-CM | POA: Diagnosis not present

## 2012-10-04 DIAGNOSIS — N400 Enlarged prostate without lower urinary tract symptoms: Secondary | ICD-10-CM | POA: Diagnosis not present

## 2012-10-08 DIAGNOSIS — M1710 Unilateral primary osteoarthritis, unspecified knee: Secondary | ICD-10-CM | POA: Diagnosis not present

## 2012-10-08 DIAGNOSIS — M25569 Pain in unspecified knee: Secondary | ICD-10-CM | POA: Diagnosis not present

## 2012-10-08 DIAGNOSIS — IMO0002 Reserved for concepts with insufficient information to code with codable children: Secondary | ICD-10-CM | POA: Diagnosis not present

## 2012-10-16 DIAGNOSIS — E079 Disorder of thyroid, unspecified: Secondary | ICD-10-CM | POA: Diagnosis not present

## 2012-10-16 DIAGNOSIS — Z862 Personal history of diseases of the blood and blood-forming organs and certain disorders involving the immune mechanism: Secondary | ICD-10-CM | POA: Diagnosis not present

## 2012-10-16 DIAGNOSIS — Z809 Family history of malignant neoplasm, unspecified: Secondary | ICD-10-CM | POA: Diagnosis not present

## 2012-10-16 DIAGNOSIS — Z5181 Encounter for therapeutic drug level monitoring: Secondary | ICD-10-CM | POA: Diagnosis not present

## 2012-10-16 DIAGNOSIS — M961 Postlaminectomy syndrome, not elsewhere classified: Secondary | ICD-10-CM

## 2012-10-16 DIAGNOSIS — G894 Chronic pain syndrome: Secondary | ICD-10-CM | POA: Diagnosis not present

## 2012-10-16 DIAGNOSIS — M545 Low back pain, unspecified: Secondary | ICD-10-CM | POA: Diagnosis not present

## 2012-10-16 DIAGNOSIS — Z9889 Other specified postprocedural states: Secondary | ICD-10-CM | POA: Diagnosis not present

## 2012-10-16 DIAGNOSIS — I714 Abdominal aortic aneurysm, without rupture: Secondary | ICD-10-CM | POA: Diagnosis not present

## 2012-10-16 DIAGNOSIS — Z79899 Other long term (current) drug therapy: Secondary | ICD-10-CM | POA: Diagnosis not present

## 2012-10-16 DIAGNOSIS — I10 Essential (primary) hypertension: Secondary | ICD-10-CM | POA: Diagnosis not present

## 2012-10-16 DIAGNOSIS — IMO0002 Reserved for concepts with insufficient information to code with codable children: Secondary | ICD-10-CM | POA: Diagnosis not present

## 2012-10-16 HISTORY — DX: Chronic pain syndrome: G89.4

## 2012-10-16 HISTORY — DX: Postlaminectomy syndrome, not elsewhere classified: M96.1

## 2012-10-19 DIAGNOSIS — I714 Abdominal aortic aneurysm, without rupture: Secondary | ICD-10-CM | POA: Diagnosis not present

## 2012-10-22 ENCOUNTER — Other Ambulatory Visit: Payer: Medicare Other

## 2012-10-22 DIAGNOSIS — I824Y9 Acute embolism and thrombosis of unspecified deep veins of unspecified proximal lower extremity: Secondary | ICD-10-CM | POA: Diagnosis not present

## 2012-10-22 DIAGNOSIS — Z7901 Long term (current) use of anticoagulants: Secondary | ICD-10-CM | POA: Diagnosis not present

## 2012-10-22 DIAGNOSIS — I2699 Other pulmonary embolism without acute cor pulmonale: Secondary | ICD-10-CM | POA: Diagnosis not present

## 2012-11-16 DIAGNOSIS — M961 Postlaminectomy syndrome, not elsewhere classified: Secondary | ICD-10-CM | POA: Diagnosis not present

## 2012-11-16 DIAGNOSIS — Z5181 Encounter for therapeutic drug level monitoring: Secondary | ICD-10-CM | POA: Diagnosis not present

## 2012-11-16 DIAGNOSIS — Z79899 Other long term (current) drug therapy: Secondary | ICD-10-CM | POA: Diagnosis not present

## 2012-11-16 DIAGNOSIS — G894 Chronic pain syndrome: Secondary | ICD-10-CM | POA: Diagnosis not present

## 2012-11-21 DIAGNOSIS — G894 Chronic pain syndrome: Secondary | ICD-10-CM | POA: Diagnosis not present

## 2012-11-21 DIAGNOSIS — M545 Low back pain, unspecified: Secondary | ICD-10-CM | POA: Diagnosis not present

## 2012-11-21 DIAGNOSIS — M47817 Spondylosis without myelopathy or radiculopathy, lumbosacral region: Secondary | ICD-10-CM | POA: Diagnosis not present

## 2012-11-21 DIAGNOSIS — M5137 Other intervertebral disc degeneration, lumbosacral region: Secondary | ICD-10-CM | POA: Diagnosis not present

## 2012-11-22 DIAGNOSIS — Z7901 Long term (current) use of anticoagulants: Secondary | ICD-10-CM | POA: Diagnosis not present

## 2012-11-22 DIAGNOSIS — I824Y9 Acute embolism and thrombosis of unspecified deep veins of unspecified proximal lower extremity: Secondary | ICD-10-CM | POA: Diagnosis not present

## 2012-11-22 DIAGNOSIS — I2699 Other pulmonary embolism without acute cor pulmonale: Secondary | ICD-10-CM | POA: Diagnosis not present

## 2012-11-22 DIAGNOSIS — I10 Essential (primary) hypertension: Secondary | ICD-10-CM | POA: Diagnosis not present

## 2012-11-29 DIAGNOSIS — N400 Enlarged prostate without lower urinary tract symptoms: Secondary | ICD-10-CM | POA: Diagnosis not present

## 2012-11-29 DIAGNOSIS — I10 Essential (primary) hypertension: Secondary | ICD-10-CM | POA: Diagnosis not present

## 2012-11-29 DIAGNOSIS — R059 Cough, unspecified: Secondary | ICD-10-CM | POA: Diagnosis not present

## 2012-11-29 DIAGNOSIS — R05 Cough: Secondary | ICD-10-CM | POA: Diagnosis not present

## 2012-12-20 DIAGNOSIS — I7090 Unspecified atherosclerosis: Secondary | ICD-10-CM | POA: Diagnosis not present

## 2012-12-20 DIAGNOSIS — Z79899 Other long term (current) drug therapy: Secondary | ICD-10-CM | POA: Diagnosis not present

## 2012-12-20 DIAGNOSIS — G589 Mononeuropathy, unspecified: Secondary | ICD-10-CM | POA: Diagnosis not present

## 2012-12-20 DIAGNOSIS — Z5181 Encounter for therapeutic drug level monitoring: Secondary | ICD-10-CM | POA: Diagnosis not present

## 2012-12-20 DIAGNOSIS — G894 Chronic pain syndrome: Secondary | ICD-10-CM | POA: Diagnosis not present

## 2012-12-20 DIAGNOSIS — M961 Postlaminectomy syndrome, not elsewhere classified: Secondary | ICD-10-CM | POA: Diagnosis not present

## 2012-12-20 DIAGNOSIS — M25559 Pain in unspecified hip: Secondary | ICD-10-CM | POA: Diagnosis not present

## 2012-12-20 DIAGNOSIS — M169 Osteoarthritis of hip, unspecified: Secondary | ICD-10-CM | POA: Diagnosis not present

## 2012-12-20 DIAGNOSIS — IMO0002 Reserved for concepts with insufficient information to code with codable children: Secondary | ICD-10-CM | POA: Diagnosis not present

## 2012-12-24 DIAGNOSIS — I824Y9 Acute embolism and thrombosis of unspecified deep veins of unspecified proximal lower extremity: Secondary | ICD-10-CM | POA: Diagnosis not present

## 2012-12-24 DIAGNOSIS — Z7901 Long term (current) use of anticoagulants: Secondary | ICD-10-CM | POA: Diagnosis not present

## 2012-12-24 DIAGNOSIS — I2699 Other pulmonary embolism without acute cor pulmonale: Secondary | ICD-10-CM | POA: Diagnosis not present

## 2012-12-24 DIAGNOSIS — I10 Essential (primary) hypertension: Secondary | ICD-10-CM | POA: Diagnosis not present

## 2013-01-21 DIAGNOSIS — Z7901 Long term (current) use of anticoagulants: Secondary | ICD-10-CM | POA: Diagnosis not present

## 2013-01-21 DIAGNOSIS — I2699 Other pulmonary embolism without acute cor pulmonale: Secondary | ICD-10-CM | POA: Diagnosis not present

## 2013-01-21 DIAGNOSIS — M1710 Unilateral primary osteoarthritis, unspecified knee: Secondary | ICD-10-CM | POA: Diagnosis not present

## 2013-01-21 DIAGNOSIS — I10 Essential (primary) hypertension: Secondary | ICD-10-CM | POA: Diagnosis not present

## 2013-01-21 DIAGNOSIS — M25569 Pain in unspecified knee: Secondary | ICD-10-CM | POA: Diagnosis not present

## 2013-01-21 DIAGNOSIS — IMO0002 Reserved for concepts with insufficient information to code with codable children: Secondary | ICD-10-CM | POA: Diagnosis not present

## 2013-01-21 DIAGNOSIS — I824Y9 Acute embolism and thrombosis of unspecified deep veins of unspecified proximal lower extremity: Secondary | ICD-10-CM | POA: Diagnosis not present

## 2013-02-04 DIAGNOSIS — IMO0002 Reserved for concepts with insufficient information to code with codable children: Secondary | ICD-10-CM | POA: Diagnosis not present

## 2013-02-04 DIAGNOSIS — Z5181 Encounter for therapeutic drug level monitoring: Secondary | ICD-10-CM | POA: Diagnosis not present

## 2013-02-04 DIAGNOSIS — M961 Postlaminectomy syndrome, not elsewhere classified: Secondary | ICD-10-CM | POA: Diagnosis not present

## 2013-02-04 DIAGNOSIS — Z79899 Other long term (current) drug therapy: Secondary | ICD-10-CM | POA: Diagnosis not present

## 2013-02-04 DIAGNOSIS — G894 Chronic pain syndrome: Secondary | ICD-10-CM | POA: Diagnosis not present

## 2013-02-19 DIAGNOSIS — G4733 Obstructive sleep apnea (adult) (pediatric): Secondary | ICD-10-CM | POA: Diagnosis not present

## 2013-02-21 DIAGNOSIS — Z7901 Long term (current) use of anticoagulants: Secondary | ICD-10-CM | POA: Diagnosis not present

## 2013-02-21 DIAGNOSIS — I824Y9 Acute embolism and thrombosis of unspecified deep veins of unspecified proximal lower extremity: Secondary | ICD-10-CM | POA: Diagnosis not present

## 2013-02-21 DIAGNOSIS — I2699 Other pulmonary embolism without acute cor pulmonale: Secondary | ICD-10-CM | POA: Diagnosis not present

## 2013-02-21 DIAGNOSIS — I10 Essential (primary) hypertension: Secondary | ICD-10-CM | POA: Diagnosis not present

## 2013-02-26 DIAGNOSIS — M961 Postlaminectomy syndrome, not elsewhere classified: Secondary | ICD-10-CM | POA: Diagnosis not present

## 2013-02-26 DIAGNOSIS — M48061 Spinal stenosis, lumbar region without neurogenic claudication: Secondary | ICD-10-CM | POA: Diagnosis not present

## 2013-02-26 DIAGNOSIS — Z5181 Encounter for therapeutic drug level monitoring: Secondary | ICD-10-CM | POA: Diagnosis not present

## 2013-02-26 DIAGNOSIS — G894 Chronic pain syndrome: Secondary | ICD-10-CM | POA: Diagnosis not present

## 2013-02-26 DIAGNOSIS — IMO0002 Reserved for concepts with insufficient information to code with codable children: Secondary | ICD-10-CM | POA: Diagnosis not present

## 2013-02-26 DIAGNOSIS — Z79899 Other long term (current) drug therapy: Secondary | ICD-10-CM | POA: Diagnosis not present

## 2013-02-28 DIAGNOSIS — IMO0002 Reserved for concepts with insufficient information to code with codable children: Secondary | ICD-10-CM | POA: Diagnosis not present

## 2013-02-28 DIAGNOSIS — R609 Edema, unspecified: Secondary | ICD-10-CM | POA: Diagnosis not present

## 2013-02-28 DIAGNOSIS — E0789 Other specified disorders of thyroid: Secondary | ICD-10-CM | POA: Diagnosis not present

## 2013-02-28 DIAGNOSIS — I10 Essential (primary) hypertension: Secondary | ICD-10-CM | POA: Diagnosis not present

## 2013-02-28 DIAGNOSIS — Z7901 Long term (current) use of anticoagulants: Secondary | ICD-10-CM | POA: Diagnosis not present

## 2013-03-06 DIAGNOSIS — N401 Enlarged prostate with lower urinary tract symptoms: Secondary | ICD-10-CM | POA: Diagnosis not present

## 2013-03-06 DIAGNOSIS — N138 Other obstructive and reflux uropathy: Secondary | ICD-10-CM | POA: Diagnosis not present

## 2013-03-18 DIAGNOSIS — M25569 Pain in unspecified knee: Secondary | ICD-10-CM | POA: Diagnosis not present

## 2013-03-18 DIAGNOSIS — IMO0002 Reserved for concepts with insufficient information to code with codable children: Secondary | ICD-10-CM | POA: Diagnosis not present

## 2013-03-18 DIAGNOSIS — M1710 Unilateral primary osteoarthritis, unspecified knee: Secondary | ICD-10-CM | POA: Diagnosis not present

## 2013-03-21 DIAGNOSIS — I824Y9 Acute embolism and thrombosis of unspecified deep veins of unspecified proximal lower extremity: Secondary | ICD-10-CM | POA: Diagnosis not present

## 2013-03-21 DIAGNOSIS — Z7901 Long term (current) use of anticoagulants: Secondary | ICD-10-CM | POA: Diagnosis not present

## 2013-03-21 DIAGNOSIS — I2699 Other pulmonary embolism without acute cor pulmonale: Secondary | ICD-10-CM | POA: Diagnosis not present

## 2013-03-21 DIAGNOSIS — I10 Essential (primary) hypertension: Secondary | ICD-10-CM | POA: Diagnosis not present

## 2013-04-02 DIAGNOSIS — I10 Essential (primary) hypertension: Secondary | ICD-10-CM | POA: Diagnosis not present

## 2013-04-02 DIAGNOSIS — Z79899 Other long term (current) drug therapy: Secondary | ICD-10-CM | POA: Diagnosis not present

## 2013-04-02 DIAGNOSIS — E0789 Other specified disorders of thyroid: Secondary | ICD-10-CM | POA: Diagnosis not present

## 2013-04-02 DIAGNOSIS — Z125 Encounter for screening for malignant neoplasm of prostate: Secondary | ICD-10-CM | POA: Diagnosis not present

## 2013-04-04 DIAGNOSIS — I2699 Other pulmonary embolism without acute cor pulmonale: Secondary | ICD-10-CM | POA: Diagnosis not present

## 2013-04-04 DIAGNOSIS — I824Y9 Acute embolism and thrombosis of unspecified deep veins of unspecified proximal lower extremity: Secondary | ICD-10-CM | POA: Diagnosis not present

## 2013-04-04 DIAGNOSIS — Z7901 Long term (current) use of anticoagulants: Secondary | ICD-10-CM | POA: Diagnosis not present

## 2013-04-05 DIAGNOSIS — M533 Sacrococcygeal disorders, not elsewhere classified: Secondary | ICD-10-CM | POA: Diagnosis not present

## 2013-04-05 DIAGNOSIS — M961 Postlaminectomy syndrome, not elsewhere classified: Secondary | ICD-10-CM | POA: Diagnosis not present

## 2013-04-05 DIAGNOSIS — G894 Chronic pain syndrome: Secondary | ICD-10-CM | POA: Diagnosis not present

## 2013-04-05 DIAGNOSIS — Z5181 Encounter for therapeutic drug level monitoring: Secondary | ICD-10-CM | POA: Diagnosis not present

## 2013-04-05 DIAGNOSIS — M79609 Pain in unspecified limb: Secondary | ICD-10-CM | POA: Diagnosis not present

## 2013-04-05 DIAGNOSIS — Z79899 Other long term (current) drug therapy: Secondary | ICD-10-CM | POA: Diagnosis not present

## 2013-04-09 DIAGNOSIS — J8410 Pulmonary fibrosis, unspecified: Secondary | ICD-10-CM | POA: Diagnosis not present

## 2013-04-09 DIAGNOSIS — E0789 Other specified disorders of thyroid: Secondary | ICD-10-CM | POA: Diagnosis not present

## 2013-04-09 DIAGNOSIS — I10 Essential (primary) hypertension: Secondary | ICD-10-CM | POA: Diagnosis not present

## 2013-04-09 DIAGNOSIS — R609 Edema, unspecified: Secondary | ICD-10-CM | POA: Diagnosis not present

## 2013-04-10 ENCOUNTER — Other Ambulatory Visit: Payer: Self-pay | Admitting: Endocrinology

## 2013-04-10 DIAGNOSIS — E041 Nontoxic single thyroid nodule: Secondary | ICD-10-CM

## 2013-04-11 DIAGNOSIS — G894 Chronic pain syndrome: Secondary | ICD-10-CM | POA: Diagnosis not present

## 2013-04-11 DIAGNOSIS — M533 Sacrococcygeal disorders, not elsewhere classified: Secondary | ICD-10-CM | POA: Diagnosis not present

## 2013-04-11 DIAGNOSIS — M461 Sacroiliitis, not elsewhere classified: Secondary | ICD-10-CM | POA: Diagnosis not present

## 2013-04-16 ENCOUNTER — Ambulatory Visit
Admission: RE | Admit: 2013-04-16 | Discharge: 2013-04-16 | Disposition: A | Discharge status: Home / Self Care | Payer: Medicare Other | Source: Ambulatory Visit | Attending: Endocrinology | Admitting: Endocrinology

## 2013-04-16 DIAGNOSIS — E042 Nontoxic multinodular goiter: Secondary | ICD-10-CM | POA: Diagnosis not present

## 2013-04-16 DIAGNOSIS — E041 Nontoxic single thyroid nodule: Secondary | ICD-10-CM

## 2013-04-24 DIAGNOSIS — M545 Low back pain, unspecified: Secondary | ICD-10-CM | POA: Diagnosis not present

## 2013-04-24 DIAGNOSIS — M5137 Other intervertebral disc degeneration, lumbosacral region: Secondary | ICD-10-CM | POA: Diagnosis not present

## 2013-04-24 DIAGNOSIS — G894 Chronic pain syndrome: Secondary | ICD-10-CM | POA: Diagnosis not present

## 2013-04-24 DIAGNOSIS — M47817 Spondylosis without myelopathy or radiculopathy, lumbosacral region: Secondary | ICD-10-CM | POA: Diagnosis not present

## 2013-04-30 DIAGNOSIS — M51379 Other intervertebral disc degeneration, lumbosacral region without mention of lumbar back pain or lower extremity pain: Secondary | ICD-10-CM

## 2013-04-30 DIAGNOSIS — M5137 Other intervertebral disc degeneration, lumbosacral region: Secondary | ICD-10-CM

## 2013-04-30 DIAGNOSIS — M47816 Spondylosis without myelopathy or radiculopathy, lumbar region: Secondary | ICD-10-CM | POA: Insufficient documentation

## 2013-04-30 HISTORY — DX: Other intervertebral disc degeneration, lumbosacral region without mention of lumbar back pain or lower extremity pain: M51.379

## 2013-05-02 DIAGNOSIS — I824Y9 Acute embolism and thrombosis of unspecified deep veins of unspecified proximal lower extremity: Secondary | ICD-10-CM | POA: Diagnosis not present

## 2013-05-02 DIAGNOSIS — I2699 Other pulmonary embolism without acute cor pulmonale: Secondary | ICD-10-CM | POA: Diagnosis not present

## 2013-05-02 DIAGNOSIS — Z7901 Long term (current) use of anticoagulants: Secondary | ICD-10-CM | POA: Diagnosis not present

## 2013-05-15 DIAGNOSIS — M1710 Unilateral primary osteoarthritis, unspecified knee: Secondary | ICD-10-CM | POA: Diagnosis not present

## 2013-05-15 DIAGNOSIS — IMO0002 Reserved for concepts with insufficient information to code with codable children: Secondary | ICD-10-CM | POA: Diagnosis not present

## 2013-05-15 DIAGNOSIS — M25569 Pain in unspecified knee: Secondary | ICD-10-CM | POA: Diagnosis not present

## 2013-06-03 DIAGNOSIS — I824Y9 Acute embolism and thrombosis of unspecified deep veins of unspecified proximal lower extremity: Secondary | ICD-10-CM | POA: Diagnosis not present

## 2013-06-03 DIAGNOSIS — Z7901 Long term (current) use of anticoagulants: Secondary | ICD-10-CM | POA: Diagnosis not present

## 2013-06-03 DIAGNOSIS — I2699 Other pulmonary embolism without acute cor pulmonale: Secondary | ICD-10-CM | POA: Diagnosis not present

## 2013-06-10 DIAGNOSIS — M961 Postlaminectomy syndrome, not elsewhere classified: Secondary | ICD-10-CM | POA: Diagnosis not present

## 2013-06-10 DIAGNOSIS — Z79899 Other long term (current) drug therapy: Secondary | ICD-10-CM | POA: Diagnosis not present

## 2013-06-10 DIAGNOSIS — Z5181 Encounter for therapeutic drug level monitoring: Secondary | ICD-10-CM | POA: Diagnosis not present

## 2013-06-10 DIAGNOSIS — G894 Chronic pain syndrome: Secondary | ICD-10-CM | POA: Diagnosis not present

## 2013-06-18 DIAGNOSIS — Z7901 Long term (current) use of anticoagulants: Secondary | ICD-10-CM | POA: Diagnosis not present

## 2013-06-18 DIAGNOSIS — I824Y9 Acute embolism and thrombosis of unspecified deep veins of unspecified proximal lower extremity: Secondary | ICD-10-CM | POA: Diagnosis not present

## 2013-06-18 DIAGNOSIS — I2699 Other pulmonary embolism without acute cor pulmonale: Secondary | ICD-10-CM | POA: Diagnosis not present

## 2013-06-20 DIAGNOSIS — M961 Postlaminectomy syndrome, not elsewhere classified: Secondary | ICD-10-CM | POA: Diagnosis not present

## 2013-06-20 DIAGNOSIS — R262 Difficulty in walking, not elsewhere classified: Secondary | ICD-10-CM | POA: Diagnosis not present

## 2013-06-20 DIAGNOSIS — G894 Chronic pain syndrome: Secondary | ICD-10-CM | POA: Diagnosis not present

## 2013-06-20 DIAGNOSIS — M5137 Other intervertebral disc degeneration, lumbosacral region: Secondary | ICD-10-CM | POA: Diagnosis not present

## 2013-06-20 DIAGNOSIS — IMO0001 Reserved for inherently not codable concepts without codable children: Secondary | ICD-10-CM | POA: Diagnosis not present

## 2013-07-12 DIAGNOSIS — IMO0002 Reserved for concepts with insufficient information to code with codable children: Secondary | ICD-10-CM | POA: Diagnosis not present

## 2013-07-12 DIAGNOSIS — M1710 Unilateral primary osteoarthritis, unspecified knee: Secondary | ICD-10-CM | POA: Diagnosis not present

## 2013-07-12 DIAGNOSIS — M25569 Pain in unspecified knee: Secondary | ICD-10-CM | POA: Diagnosis not present

## 2013-07-16 DIAGNOSIS — Z7901 Long term (current) use of anticoagulants: Secondary | ICD-10-CM | POA: Diagnosis not present

## 2013-07-16 DIAGNOSIS — I824Y9 Acute embolism and thrombosis of unspecified deep veins of unspecified proximal lower extremity: Secondary | ICD-10-CM | POA: Diagnosis not present

## 2013-07-16 DIAGNOSIS — I2699 Other pulmonary embolism without acute cor pulmonale: Secondary | ICD-10-CM | POA: Diagnosis not present

## 2013-07-24 DIAGNOSIS — K5730 Diverticulosis of large intestine without perforation or abscess without bleeding: Secondary | ICD-10-CM | POA: Diagnosis not present

## 2013-07-24 DIAGNOSIS — Z86010 Personal history of colonic polyps: Secondary | ICD-10-CM | POA: Diagnosis not present

## 2013-07-31 DIAGNOSIS — M47817 Spondylosis without myelopathy or radiculopathy, lumbosacral region: Secondary | ICD-10-CM | POA: Diagnosis not present

## 2013-07-31 DIAGNOSIS — M5137 Other intervertebral disc degeneration, lumbosacral region: Secondary | ICD-10-CM | POA: Diagnosis not present

## 2013-07-31 DIAGNOSIS — M545 Low back pain, unspecified: Secondary | ICD-10-CM | POA: Diagnosis not present

## 2013-07-31 DIAGNOSIS — G894 Chronic pain syndrome: Secondary | ICD-10-CM | POA: Diagnosis not present

## 2013-08-06 DIAGNOSIS — I10 Essential (primary) hypertension: Secondary | ICD-10-CM | POA: Diagnosis not present

## 2013-08-06 DIAGNOSIS — M25569 Pain in unspecified knee: Secondary | ICD-10-CM | POA: Diagnosis not present

## 2013-08-13 DIAGNOSIS — I10 Essential (primary) hypertension: Secondary | ICD-10-CM | POA: Diagnosis not present

## 2013-08-13 DIAGNOSIS — E0789 Other specified disorders of thyroid: Secondary | ICD-10-CM | POA: Diagnosis not present

## 2013-08-13 DIAGNOSIS — Z23 Encounter for immunization: Secondary | ICD-10-CM | POA: Diagnosis not present

## 2013-08-13 DIAGNOSIS — R609 Edema, unspecified: Secondary | ICD-10-CM | POA: Diagnosis not present

## 2013-08-13 DIAGNOSIS — M109 Gout, unspecified: Secondary | ICD-10-CM | POA: Diagnosis not present

## 2013-08-15 DIAGNOSIS — I2699 Other pulmonary embolism without acute cor pulmonale: Secondary | ICD-10-CM | POA: Diagnosis not present

## 2013-08-15 DIAGNOSIS — I824Y9 Acute embolism and thrombosis of unspecified deep veins of unspecified proximal lower extremity: Secondary | ICD-10-CM | POA: Diagnosis not present

## 2013-08-15 DIAGNOSIS — Z7901 Long term (current) use of anticoagulants: Secondary | ICD-10-CM | POA: Diagnosis not present

## 2013-08-29 DIAGNOSIS — M1710 Unilateral primary osteoarthritis, unspecified knee: Secondary | ICD-10-CM | POA: Diagnosis not present

## 2013-08-29 DIAGNOSIS — IMO0002 Reserved for concepts with insufficient information to code with codable children: Secondary | ICD-10-CM | POA: Diagnosis not present

## 2013-08-29 DIAGNOSIS — M25569 Pain in unspecified knee: Secondary | ICD-10-CM | POA: Diagnosis not present

## 2013-09-16 DIAGNOSIS — I824Y9 Acute embolism and thrombosis of unspecified deep veins of unspecified proximal lower extremity: Secondary | ICD-10-CM | POA: Diagnosis not present

## 2013-09-16 DIAGNOSIS — I2699 Other pulmonary embolism without acute cor pulmonale: Secondary | ICD-10-CM | POA: Diagnosis not present

## 2013-09-16 DIAGNOSIS — Z7901 Long term (current) use of anticoagulants: Secondary | ICD-10-CM | POA: Diagnosis not present

## 2013-09-16 DIAGNOSIS — E039 Hypothyroidism, unspecified: Secondary | ICD-10-CM | POA: Diagnosis not present

## 2013-09-16 DIAGNOSIS — I10 Essential (primary) hypertension: Secondary | ICD-10-CM | POA: Diagnosis not present

## 2013-09-16 DIAGNOSIS — Z006 Encounter for examination for normal comparison and control in clinical research program: Secondary | ICD-10-CM | POA: Diagnosis not present

## 2013-09-27 DIAGNOSIS — M533 Sacrococcygeal disorders, not elsewhere classified: Secondary | ICD-10-CM | POA: Diagnosis not present

## 2013-09-27 DIAGNOSIS — IMO0002 Reserved for concepts with insufficient information to code with codable children: Secondary | ICD-10-CM | POA: Diagnosis not present

## 2013-09-27 DIAGNOSIS — R109 Unspecified abdominal pain: Secondary | ICD-10-CM | POA: Diagnosis not present

## 2013-09-27 DIAGNOSIS — M545 Low back pain, unspecified: Secondary | ICD-10-CM | POA: Diagnosis not present

## 2013-09-27 DIAGNOSIS — G894 Chronic pain syndrome: Secondary | ICD-10-CM | POA: Diagnosis not present

## 2013-09-27 DIAGNOSIS — M961 Postlaminectomy syndrome, not elsewhere classified: Secondary | ICD-10-CM | POA: Diagnosis not present

## 2013-10-14 DIAGNOSIS — I824Y9 Acute embolism and thrombosis of unspecified deep veins of unspecified proximal lower extremity: Secondary | ICD-10-CM | POA: Diagnosis not present

## 2013-10-14 DIAGNOSIS — Z7901 Long term (current) use of anticoagulants: Secondary | ICD-10-CM | POA: Diagnosis not present

## 2013-10-14 DIAGNOSIS — I2699 Other pulmonary embolism without acute cor pulmonale: Secondary | ICD-10-CM | POA: Diagnosis not present

## 2013-10-17 DIAGNOSIS — G4730 Sleep apnea, unspecified: Secondary | ICD-10-CM | POA: Diagnosis not present

## 2013-10-24 DIAGNOSIS — Z7901 Long term (current) use of anticoagulants: Secondary | ICD-10-CM | POA: Diagnosis not present

## 2013-10-30 DIAGNOSIS — M533 Sacrococcygeal disorders, not elsewhere classified: Secondary | ICD-10-CM | POA: Diagnosis not present

## 2013-10-30 DIAGNOSIS — G894 Chronic pain syndrome: Secondary | ICD-10-CM | POA: Diagnosis not present

## 2013-10-30 DIAGNOSIS — M461 Sacroiliitis, not elsewhere classified: Secondary | ICD-10-CM | POA: Diagnosis not present

## 2013-11-04 DIAGNOSIS — IMO0002 Reserved for concepts with insufficient information to code with codable children: Secondary | ICD-10-CM | POA: Diagnosis not present

## 2013-11-04 DIAGNOSIS — M1710 Unilateral primary osteoarthritis, unspecified knee: Secondary | ICD-10-CM | POA: Diagnosis not present

## 2013-11-04 DIAGNOSIS — M25569 Pain in unspecified knee: Secondary | ICD-10-CM | POA: Diagnosis not present

## 2013-11-18 DIAGNOSIS — Z7901 Long term (current) use of anticoagulants: Secondary | ICD-10-CM | POA: Diagnosis not present

## 2013-11-18 DIAGNOSIS — I824Y9 Acute embolism and thrombosis of unspecified deep veins of unspecified proximal lower extremity: Secondary | ICD-10-CM | POA: Diagnosis not present

## 2013-11-18 DIAGNOSIS — I10 Essential (primary) hypertension: Secondary | ICD-10-CM | POA: Diagnosis not present

## 2013-11-18 DIAGNOSIS — I2699 Other pulmonary embolism without acute cor pulmonale: Secondary | ICD-10-CM | POA: Diagnosis not present

## 2013-12-05 DIAGNOSIS — IMO0002 Reserved for concepts with insufficient information to code with codable children: Secondary | ICD-10-CM | POA: Diagnosis not present

## 2013-12-05 DIAGNOSIS — M25569 Pain in unspecified knee: Secondary | ICD-10-CM | POA: Diagnosis not present

## 2013-12-05 DIAGNOSIS — M1710 Unilateral primary osteoarthritis, unspecified knee: Secondary | ICD-10-CM | POA: Diagnosis not present

## 2013-12-10 DIAGNOSIS — E789 Disorder of lipoprotein metabolism, unspecified: Secondary | ICD-10-CM | POA: Diagnosis not present

## 2013-12-16 DIAGNOSIS — I10 Essential (primary) hypertension: Secondary | ICD-10-CM | POA: Diagnosis not present

## 2013-12-16 DIAGNOSIS — E0789 Other specified disorders of thyroid: Secondary | ICD-10-CM | POA: Diagnosis not present

## 2013-12-16 DIAGNOSIS — M79609 Pain in unspecified limb: Secondary | ICD-10-CM | POA: Diagnosis not present

## 2013-12-17 DIAGNOSIS — M47817 Spondylosis without myelopathy or radiculopathy, lumbosacral region: Secondary | ICD-10-CM | POA: Diagnosis not present

## 2013-12-17 DIAGNOSIS — G894 Chronic pain syndrome: Secondary | ICD-10-CM | POA: Diagnosis not present

## 2013-12-19 DIAGNOSIS — I824Y9 Acute embolism and thrombosis of unspecified deep veins of unspecified proximal lower extremity: Secondary | ICD-10-CM | POA: Diagnosis not present

## 2013-12-19 DIAGNOSIS — Z7901 Long term (current) use of anticoagulants: Secondary | ICD-10-CM | POA: Diagnosis not present

## 2013-12-19 DIAGNOSIS — I2699 Other pulmonary embolism without acute cor pulmonale: Secondary | ICD-10-CM | POA: Diagnosis not present

## 2014-01-02 DIAGNOSIS — M545 Low back pain, unspecified: Secondary | ICD-10-CM | POA: Diagnosis not present

## 2014-01-02 DIAGNOSIS — M47817 Spondylosis without myelopathy or radiculopathy, lumbosacral region: Secondary | ICD-10-CM | POA: Diagnosis not present

## 2014-01-02 DIAGNOSIS — G8929 Other chronic pain: Secondary | ICD-10-CM | POA: Diagnosis not present

## 2014-01-02 DIAGNOSIS — G894 Chronic pain syndrome: Secondary | ICD-10-CM | POA: Diagnosis not present

## 2014-01-02 DIAGNOSIS — M5137 Other intervertebral disc degeneration, lumbosacral region: Secondary | ICD-10-CM | POA: Diagnosis not present

## 2014-01-23 DIAGNOSIS — Z7901 Long term (current) use of anticoagulants: Secondary | ICD-10-CM | POA: Diagnosis not present

## 2014-01-23 DIAGNOSIS — I2699 Other pulmonary embolism without acute cor pulmonale: Secondary | ICD-10-CM | POA: Diagnosis not present

## 2014-01-23 DIAGNOSIS — I824Y9 Acute embolism and thrombosis of unspecified deep veins of unspecified proximal lower extremity: Secondary | ICD-10-CM | POA: Diagnosis not present

## 2014-01-31 DIAGNOSIS — G894 Chronic pain syndrome: Secondary | ICD-10-CM | POA: Diagnosis not present

## 2014-01-31 DIAGNOSIS — IMO0002 Reserved for concepts with insufficient information to code with codable children: Secondary | ICD-10-CM | POA: Diagnosis not present

## 2014-01-31 DIAGNOSIS — M47817 Spondylosis without myelopathy or radiculopathy, lumbosacral region: Secondary | ICD-10-CM | POA: Diagnosis not present

## 2014-01-31 DIAGNOSIS — K6289 Other specified diseases of anus and rectum: Secondary | ICD-10-CM | POA: Diagnosis not present

## 2014-01-31 DIAGNOSIS — E079 Disorder of thyroid, unspecified: Secondary | ICD-10-CM | POA: Diagnosis not present

## 2014-01-31 DIAGNOSIS — Z7901 Long term (current) use of anticoagulants: Secondary | ICD-10-CM | POA: Diagnosis not present

## 2014-01-31 DIAGNOSIS — Z5181 Encounter for therapeutic drug level monitoring: Secondary | ICD-10-CM | POA: Diagnosis not present

## 2014-01-31 DIAGNOSIS — M545 Low back pain, unspecified: Secondary | ICD-10-CM | POA: Diagnosis not present

## 2014-01-31 DIAGNOSIS — M533 Sacrococcygeal disorders, not elsewhere classified: Secondary | ICD-10-CM | POA: Diagnosis not present

## 2014-01-31 DIAGNOSIS — M5137 Other intervertebral disc degeneration, lumbosacral region: Secondary | ICD-10-CM | POA: Diagnosis not present

## 2014-01-31 DIAGNOSIS — Z79899 Other long term (current) drug therapy: Secondary | ICD-10-CM | POA: Diagnosis not present

## 2014-01-31 DIAGNOSIS — M461 Sacroiliitis, not elsewhere classified: Secondary | ICD-10-CM | POA: Diagnosis not present

## 2014-01-31 DIAGNOSIS — Z86718 Personal history of other venous thrombosis and embolism: Secondary | ICD-10-CM | POA: Diagnosis not present

## 2014-01-31 DIAGNOSIS — I10 Essential (primary) hypertension: Secondary | ICD-10-CM | POA: Diagnosis not present

## 2014-02-06 DIAGNOSIS — Z7901 Long term (current) use of anticoagulants: Secondary | ICD-10-CM | POA: Diagnosis not present

## 2014-02-06 DIAGNOSIS — I824Y9 Acute embolism and thrombosis of unspecified deep veins of unspecified proximal lower extremity: Secondary | ICD-10-CM | POA: Diagnosis not present

## 2014-02-06 DIAGNOSIS — I2699 Other pulmonary embolism without acute cor pulmonale: Secondary | ICD-10-CM | POA: Diagnosis not present

## 2014-02-19 DIAGNOSIS — G4733 Obstructive sleep apnea (adult) (pediatric): Secondary | ICD-10-CM | POA: Diagnosis not present

## 2014-02-19 DIAGNOSIS — E7800 Pure hypercholesterolemia, unspecified: Secondary | ICD-10-CM | POA: Diagnosis not present

## 2014-02-19 DIAGNOSIS — I10 Essential (primary) hypertension: Secondary | ICD-10-CM | POA: Diagnosis not present

## 2014-02-21 DIAGNOSIS — G894 Chronic pain syndrome: Secondary | ICD-10-CM | POA: Diagnosis not present

## 2014-02-21 DIAGNOSIS — K6289 Other specified diseases of anus and rectum: Secondary | ICD-10-CM | POA: Diagnosis not present

## 2014-02-21 DIAGNOSIS — R109 Unspecified abdominal pain: Secondary | ICD-10-CM | POA: Diagnosis not present

## 2014-03-10 DIAGNOSIS — N138 Other obstructive and reflux uropathy: Secondary | ICD-10-CM | POA: Diagnosis not present

## 2014-03-10 DIAGNOSIS — N139 Obstructive and reflux uropathy, unspecified: Secondary | ICD-10-CM | POA: Diagnosis not present

## 2014-03-10 DIAGNOSIS — N401 Enlarged prostate with lower urinary tract symptoms: Secondary | ICD-10-CM | POA: Diagnosis not present

## 2014-03-11 DIAGNOSIS — I2699 Other pulmonary embolism without acute cor pulmonale: Secondary | ICD-10-CM | POA: Diagnosis not present

## 2014-03-11 DIAGNOSIS — I824Y9 Acute embolism and thrombosis of unspecified deep veins of unspecified proximal lower extremity: Secondary | ICD-10-CM | POA: Diagnosis not present

## 2014-03-11 DIAGNOSIS — Z7901 Long term (current) use of anticoagulants: Secondary | ICD-10-CM | POA: Diagnosis not present

## 2014-03-11 DIAGNOSIS — I10 Essential (primary) hypertension: Secondary | ICD-10-CM | POA: Diagnosis not present

## 2014-03-12 DIAGNOSIS — M1710 Unilateral primary osteoarthritis, unspecified knee: Secondary | ICD-10-CM | POA: Diagnosis not present

## 2014-03-12 DIAGNOSIS — M25569 Pain in unspecified knee: Secondary | ICD-10-CM | POA: Diagnosis not present

## 2014-03-12 DIAGNOSIS — IMO0002 Reserved for concepts with insufficient information to code with codable children: Secondary | ICD-10-CM | POA: Diagnosis not present

## 2014-03-25 DIAGNOSIS — Z7901 Long term (current) use of anticoagulants: Secondary | ICD-10-CM | POA: Diagnosis not present

## 2014-03-25 DIAGNOSIS — I2699 Other pulmonary embolism without acute cor pulmonale: Secondary | ICD-10-CM | POA: Diagnosis not present

## 2014-03-25 DIAGNOSIS — I824Y9 Acute embolism and thrombosis of unspecified deep veins of unspecified proximal lower extremity: Secondary | ICD-10-CM | POA: Diagnosis not present

## 2014-03-28 DIAGNOSIS — K6289 Other specified diseases of anus and rectum: Secondary | ICD-10-CM | POA: Diagnosis not present

## 2014-03-28 DIAGNOSIS — G894 Chronic pain syndrome: Secondary | ICD-10-CM | POA: Diagnosis not present

## 2014-04-03 DIAGNOSIS — Z125 Encounter for screening for malignant neoplasm of prostate: Secondary | ICD-10-CM | POA: Diagnosis not present

## 2014-04-03 DIAGNOSIS — I10 Essential (primary) hypertension: Secondary | ICD-10-CM | POA: Diagnosis not present

## 2014-04-03 DIAGNOSIS — E789 Disorder of lipoprotein metabolism, unspecified: Secondary | ICD-10-CM | POA: Diagnosis not present

## 2014-04-03 DIAGNOSIS — E0789 Other specified disorders of thyroid: Secondary | ICD-10-CM | POA: Diagnosis not present

## 2014-04-10 DIAGNOSIS — R609 Edema, unspecified: Secondary | ICD-10-CM | POA: Diagnosis not present

## 2014-04-10 DIAGNOSIS — N400 Enlarged prostate without lower urinary tract symptoms: Secondary | ICD-10-CM | POA: Diagnosis not present

## 2014-04-10 DIAGNOSIS — E0789 Other specified disorders of thyroid: Secondary | ICD-10-CM | POA: Diagnosis not present

## 2014-04-22 DIAGNOSIS — Z7901 Long term (current) use of anticoagulants: Secondary | ICD-10-CM | POA: Diagnosis not present

## 2014-04-22 DIAGNOSIS — I2699 Other pulmonary embolism without acute cor pulmonale: Secondary | ICD-10-CM | POA: Diagnosis not present

## 2014-05-15 DIAGNOSIS — M25569 Pain in unspecified knee: Secondary | ICD-10-CM | POA: Diagnosis not present

## 2014-05-15 DIAGNOSIS — IMO0002 Reserved for concepts with insufficient information to code with codable children: Secondary | ICD-10-CM | POA: Diagnosis not present

## 2014-05-15 DIAGNOSIS — M1710 Unilateral primary osteoarthritis, unspecified knee: Secondary | ICD-10-CM | POA: Diagnosis not present

## 2014-05-20 DIAGNOSIS — I2699 Other pulmonary embolism without acute cor pulmonale: Secondary | ICD-10-CM | POA: Diagnosis not present

## 2014-05-20 DIAGNOSIS — Z7901 Long term (current) use of anticoagulants: Secondary | ICD-10-CM | POA: Diagnosis not present

## 2014-06-02 DIAGNOSIS — IMO0002 Reserved for concepts with insufficient information to code with codable children: Secondary | ICD-10-CM | POA: Diagnosis not present

## 2014-06-02 DIAGNOSIS — M25569 Pain in unspecified knee: Secondary | ICD-10-CM | POA: Diagnosis not present

## 2014-06-02 DIAGNOSIS — M1710 Unilateral primary osteoarthritis, unspecified knee: Secondary | ICD-10-CM | POA: Diagnosis not present

## 2014-06-17 DIAGNOSIS — I2699 Other pulmonary embolism without acute cor pulmonale: Secondary | ICD-10-CM | POA: Diagnosis not present

## 2014-06-17 DIAGNOSIS — Z7901 Long term (current) use of anticoagulants: Secondary | ICD-10-CM | POA: Diagnosis not present

## 2014-07-17 DIAGNOSIS — Z7901 Long term (current) use of anticoagulants: Secondary | ICD-10-CM | POA: Diagnosis not present

## 2014-07-17 DIAGNOSIS — I2699 Other pulmonary embolism without acute cor pulmonale: Secondary | ICD-10-CM | POA: Diagnosis not present

## 2014-07-31 DIAGNOSIS — I2699 Other pulmonary embolism without acute cor pulmonale: Secondary | ICD-10-CM | POA: Diagnosis not present

## 2014-07-31 DIAGNOSIS — Z7901 Long term (current) use of anticoagulants: Secondary | ICD-10-CM | POA: Diagnosis not present

## 2014-08-04 DIAGNOSIS — R609 Edema, unspecified: Secondary | ICD-10-CM | POA: Diagnosis not present

## 2014-08-04 DIAGNOSIS — E0789 Other specified disorders of thyroid: Secondary | ICD-10-CM | POA: Diagnosis not present

## 2014-08-11 DIAGNOSIS — IMO0002 Reserved for concepts with insufficient information to code with codable children: Secondary | ICD-10-CM | POA: Diagnosis not present

## 2014-08-11 DIAGNOSIS — E0789 Other specified disorders of thyroid: Secondary | ICD-10-CM | POA: Diagnosis not present

## 2014-08-11 DIAGNOSIS — M109 Gout, unspecified: Secondary | ICD-10-CM | POA: Diagnosis not present

## 2014-08-11 DIAGNOSIS — M1710 Unilateral primary osteoarthritis, unspecified knee: Secondary | ICD-10-CM | POA: Diagnosis not present

## 2014-08-11 DIAGNOSIS — I10 Essential (primary) hypertension: Secondary | ICD-10-CM | POA: Diagnosis not present

## 2014-08-11 DIAGNOSIS — M25469 Effusion, unspecified knee: Secondary | ICD-10-CM | POA: Diagnosis not present

## 2014-08-21 DIAGNOSIS — M170 Bilateral primary osteoarthritis of knee: Secondary | ICD-10-CM | POA: Diagnosis not present

## 2014-08-21 DIAGNOSIS — M25561 Pain in right knee: Secondary | ICD-10-CM | POA: Diagnosis not present

## 2014-09-11 DIAGNOSIS — I2699 Other pulmonary embolism without acute cor pulmonale: Secondary | ICD-10-CM | POA: Diagnosis not present

## 2014-09-11 DIAGNOSIS — Z7901 Long term (current) use of anticoagulants: Secondary | ICD-10-CM | POA: Diagnosis not present

## 2014-09-26 DIAGNOSIS — M25569 Pain in unspecified knee: Secondary | ICD-10-CM | POA: Diagnosis not present

## 2014-09-26 DIAGNOSIS — M170 Bilateral primary osteoarthritis of knee: Secondary | ICD-10-CM | POA: Diagnosis not present

## 2014-09-26 DIAGNOSIS — M1180 Other specified crystal arthropathies, unspecified site: Secondary | ICD-10-CM | POA: Diagnosis not present

## 2014-10-13 DIAGNOSIS — Z23 Encounter for immunization: Secondary | ICD-10-CM | POA: Diagnosis not present

## 2014-10-13 DIAGNOSIS — I2699 Other pulmonary embolism without acute cor pulmonale: Secondary | ICD-10-CM | POA: Diagnosis not present

## 2014-10-13 DIAGNOSIS — Z7901 Long term (current) use of anticoagulants: Secondary | ICD-10-CM | POA: Diagnosis not present

## 2014-10-21 DIAGNOSIS — G4733 Obstructive sleep apnea (adult) (pediatric): Secondary | ICD-10-CM | POA: Diagnosis not present

## 2014-11-03 DIAGNOSIS — E0789 Other specified disorders of thyroid: Secondary | ICD-10-CM | POA: Diagnosis not present

## 2014-11-11 DIAGNOSIS — Z7901 Long term (current) use of anticoagulants: Secondary | ICD-10-CM | POA: Diagnosis not present

## 2014-11-11 DIAGNOSIS — I2699 Other pulmonary embolism without acute cor pulmonale: Secondary | ICD-10-CM | POA: Diagnosis not present

## 2014-11-12 DIAGNOSIS — F4320 Adjustment disorder, unspecified: Secondary | ICD-10-CM | POA: Diagnosis not present

## 2014-11-12 DIAGNOSIS — M25569 Pain in unspecified knee: Secondary | ICD-10-CM | POA: Diagnosis not present

## 2014-11-12 DIAGNOSIS — E039 Hypothyroidism, unspecified: Secondary | ICD-10-CM | POA: Diagnosis not present

## 2014-11-12 DIAGNOSIS — E790 Hyperuricemia without signs of inflammatory arthritis and tophaceous disease: Secondary | ICD-10-CM | POA: Diagnosis not present

## 2014-12-01 ENCOUNTER — Other Ambulatory Visit: Payer: Self-pay | Admitting: Surgery

## 2014-12-01 DIAGNOSIS — A630 Anogenital (venereal) warts: Secondary | ICD-10-CM | POA: Diagnosis not present

## 2014-12-01 DIAGNOSIS — R198 Other specified symptoms and signs involving the digestive system and abdomen: Secondary | ICD-10-CM | POA: Diagnosis not present

## 2014-12-01 DIAGNOSIS — D045 Carcinoma in situ of skin of trunk: Secondary | ICD-10-CM | POA: Diagnosis not present

## 2014-12-03 DIAGNOSIS — M25569 Pain in unspecified knee: Secondary | ICD-10-CM | POA: Diagnosis not present

## 2014-12-03 DIAGNOSIS — I10 Essential (primary) hypertension: Secondary | ICD-10-CM | POA: Diagnosis not present

## 2014-12-09 DIAGNOSIS — Z7901 Long term (current) use of anticoagulants: Secondary | ICD-10-CM | POA: Diagnosis not present

## 2014-12-09 DIAGNOSIS — Z86711 Personal history of pulmonary embolism: Secondary | ICD-10-CM | POA: Diagnosis not present

## 2014-12-24 DIAGNOSIS — M170 Bilateral primary osteoarthritis of knee: Secondary | ICD-10-CM | POA: Diagnosis not present

## 2015-01-06 DIAGNOSIS — I2699 Other pulmonary embolism without acute cor pulmonale: Secondary | ICD-10-CM | POA: Diagnosis not present

## 2015-01-06 DIAGNOSIS — Z7901 Long term (current) use of anticoagulants: Secondary | ICD-10-CM | POA: Diagnosis not present

## 2015-01-27 DIAGNOSIS — M1180 Other specified crystal arthropathies, unspecified site: Secondary | ICD-10-CM | POA: Diagnosis not present

## 2015-01-27 DIAGNOSIS — M170 Bilateral primary osteoarthritis of knee: Secondary | ICD-10-CM | POA: Diagnosis not present

## 2015-01-27 DIAGNOSIS — M25569 Pain in unspecified knee: Secondary | ICD-10-CM | POA: Diagnosis not present

## 2015-03-06 DIAGNOSIS — M170 Bilateral primary osteoarthritis of knee: Secondary | ICD-10-CM | POA: Diagnosis not present

## 2015-03-06 DIAGNOSIS — M1180 Other specified crystal arthropathies, unspecified site: Secondary | ICD-10-CM | POA: Diagnosis not present

## 2015-03-06 DIAGNOSIS — M25569 Pain in unspecified knee: Secondary | ICD-10-CM | POA: Diagnosis not present

## 2015-03-11 DIAGNOSIS — N138 Other obstructive and reflux uropathy: Secondary | ICD-10-CM | POA: Diagnosis not present

## 2015-03-11 DIAGNOSIS — N401 Enlarged prostate with lower urinary tract symptoms: Secondary | ICD-10-CM | POA: Diagnosis not present

## 2015-03-11 DIAGNOSIS — R351 Nocturia: Secondary | ICD-10-CM | POA: Diagnosis not present

## 2015-03-11 DIAGNOSIS — R350 Frequency of micturition: Secondary | ICD-10-CM | POA: Diagnosis not present

## 2015-04-06 DIAGNOSIS — E119 Type 2 diabetes mellitus without complications: Secondary | ICD-10-CM | POA: Diagnosis not present

## 2015-04-06 DIAGNOSIS — Z125 Encounter for screening for malignant neoplasm of prostate: Secondary | ICD-10-CM | POA: Diagnosis not present

## 2015-04-06 DIAGNOSIS — E039 Hypothyroidism, unspecified: Secondary | ICD-10-CM | POA: Diagnosis not present

## 2015-04-06 DIAGNOSIS — I10 Essential (primary) hypertension: Secondary | ICD-10-CM | POA: Diagnosis not present

## 2015-04-09 DIAGNOSIS — Z86711 Personal history of pulmonary embolism: Secondary | ICD-10-CM | POA: Diagnosis not present

## 2015-04-14 ENCOUNTER — Other Ambulatory Visit: Payer: Self-pay | Admitting: Endocrinology

## 2015-04-14 DIAGNOSIS — E041 Nontoxic single thyroid nodule: Secondary | ICD-10-CM

## 2015-04-14 DIAGNOSIS — Z7901 Long term (current) use of anticoagulants: Secondary | ICD-10-CM | POA: Diagnosis not present

## 2015-04-14 DIAGNOSIS — E032 Hypothyroidism due to medicaments and other exogenous substances: Secondary | ICD-10-CM | POA: Diagnosis not present

## 2015-04-14 DIAGNOSIS — I10 Essential (primary) hypertension: Secondary | ICD-10-CM | POA: Diagnosis not present

## 2015-04-17 ENCOUNTER — Ambulatory Visit
Admission: RE | Admit: 2015-04-17 | Discharge: 2015-04-17 | Disposition: A | Discharge status: Home / Self Care | Payer: Medicare Other | Source: Ambulatory Visit | Attending: Endocrinology | Admitting: Endocrinology

## 2015-04-17 DIAGNOSIS — E042 Nontoxic multinodular goiter: Secondary | ICD-10-CM | POA: Diagnosis not present

## 2015-04-17 DIAGNOSIS — E041 Nontoxic single thyroid nodule: Secondary | ICD-10-CM

## 2015-04-21 DIAGNOSIS — M170 Bilateral primary osteoarthritis of knee: Secondary | ICD-10-CM | POA: Diagnosis not present

## 2015-04-21 DIAGNOSIS — M1180 Other specified crystal arthropathies, unspecified site: Secondary | ICD-10-CM | POA: Diagnosis not present

## 2015-04-21 DIAGNOSIS — M25569 Pain in unspecified knee: Secondary | ICD-10-CM | POA: Diagnosis not present

## 2015-05-06 DIAGNOSIS — M170 Bilateral primary osteoarthritis of knee: Secondary | ICD-10-CM | POA: Diagnosis not present

## 2015-06-16 DIAGNOSIS — M170 Bilateral primary osteoarthritis of knee: Secondary | ICD-10-CM | POA: Diagnosis not present

## 2015-06-23 DIAGNOSIS — D013 Carcinoma in situ of anus and anal canal: Secondary | ICD-10-CM | POA: Diagnosis not present

## 2015-07-13 DIAGNOSIS — M170 Bilateral primary osteoarthritis of knee: Secondary | ICD-10-CM | POA: Diagnosis not present

## 2015-07-29 DIAGNOSIS — M170 Bilateral primary osteoarthritis of knee: Secondary | ICD-10-CM | POA: Diagnosis not present

## 2015-07-29 DIAGNOSIS — I10 Essential (primary) hypertension: Secondary | ICD-10-CM | POA: Diagnosis not present

## 2015-08-26 DIAGNOSIS — M1180 Other specified crystal arthropathies, unspecified site: Secondary | ICD-10-CM | POA: Diagnosis not present

## 2015-08-26 DIAGNOSIS — M170 Bilateral primary osteoarthritis of knee: Secondary | ICD-10-CM | POA: Diagnosis not present

## 2015-08-26 DIAGNOSIS — M25561 Pain in right knee: Secondary | ICD-10-CM | POA: Diagnosis not present

## 2015-09-03 DIAGNOSIS — G4733 Obstructive sleep apnea (adult) (pediatric): Secondary | ICD-10-CM | POA: Diagnosis not present

## 2015-09-03 DIAGNOSIS — Z23 Encounter for immunization: Secondary | ICD-10-CM | POA: Diagnosis not present

## 2015-09-21 DIAGNOSIS — G4733 Obstructive sleep apnea (adult) (pediatric): Secondary | ICD-10-CM | POA: Diagnosis not present

## 2015-10-01 DIAGNOSIS — Z86711 Personal history of pulmonary embolism: Secondary | ICD-10-CM | POA: Diagnosis not present

## 2015-10-05 DIAGNOSIS — M170 Bilateral primary osteoarthritis of knee: Secondary | ICD-10-CM | POA: Diagnosis not present

## 2015-10-06 DIAGNOSIS — Z86711 Personal history of pulmonary embolism: Secondary | ICD-10-CM | POA: Diagnosis not present

## 2015-10-06 DIAGNOSIS — Z7901 Long term (current) use of anticoagulants: Secondary | ICD-10-CM | POA: Diagnosis not present

## 2015-10-14 DIAGNOSIS — E039 Hypothyroidism, unspecified: Secondary | ICD-10-CM | POA: Diagnosis not present

## 2015-10-14 DIAGNOSIS — M1990 Unspecified osteoarthritis, unspecified site: Secondary | ICD-10-CM | POA: Diagnosis not present

## 2015-10-14 DIAGNOSIS — I10 Essential (primary) hypertension: Secondary | ICD-10-CM | POA: Diagnosis not present

## 2015-10-14 DIAGNOSIS — M109 Gout, unspecified: Secondary | ICD-10-CM | POA: Diagnosis not present

## 2015-10-21 DIAGNOSIS — H40009 Preglaucoma, unspecified, unspecified eye: Secondary | ICD-10-CM | POA: Diagnosis not present

## 2015-10-28 DIAGNOSIS — H353130 Nonexudative age-related macular degeneration, bilateral, stage unspecified: Secondary | ICD-10-CM | POA: Diagnosis not present

## 2015-10-28 DIAGNOSIS — H401112 Primary open-angle glaucoma, right eye, moderate stage: Secondary | ICD-10-CM | POA: Diagnosis not present

## 2015-10-28 DIAGNOSIS — H401123 Primary open-angle glaucoma, left eye, severe stage: Secondary | ICD-10-CM | POA: Diagnosis not present

## 2015-10-28 DIAGNOSIS — H11003 Unspecified pterygium of eye, bilateral: Secondary | ICD-10-CM | POA: Diagnosis not present

## 2015-11-30 DIAGNOSIS — M1180 Other specified crystal arthropathies, unspecified site: Secondary | ICD-10-CM | POA: Diagnosis not present

## 2015-11-30 DIAGNOSIS — M170 Bilateral primary osteoarthritis of knee: Secondary | ICD-10-CM | POA: Diagnosis not present

## 2015-11-30 DIAGNOSIS — M25561 Pain in right knee: Secondary | ICD-10-CM | POA: Diagnosis not present

## 2015-12-07 DIAGNOSIS — G4733 Obstructive sleep apnea (adult) (pediatric): Secondary | ICD-10-CM | POA: Diagnosis not present

## 2015-12-07 DIAGNOSIS — Z9989 Dependence on other enabling machines and devices: Secondary | ICD-10-CM | POA: Diagnosis not present

## 2015-12-09 DIAGNOSIS — H401123 Primary open-angle glaucoma, left eye, severe stage: Secondary | ICD-10-CM | POA: Diagnosis not present

## 2015-12-16 DIAGNOSIS — M170 Bilateral primary osteoarthritis of knee: Secondary | ICD-10-CM | POA: Diagnosis not present

## 2016-01-04 DIAGNOSIS — D013 Carcinoma in situ of anus and anal canal: Secondary | ICD-10-CM | POA: Diagnosis not present

## 2016-01-04 DIAGNOSIS — L290 Pruritus ani: Secondary | ICD-10-CM | POA: Diagnosis not present

## 2016-01-13 DIAGNOSIS — M170 Bilateral primary osteoarthritis of knee: Secondary | ICD-10-CM | POA: Diagnosis not present

## 2016-01-13 DIAGNOSIS — M109 Gout, unspecified: Secondary | ICD-10-CM | POA: Diagnosis not present

## 2016-01-13 DIAGNOSIS — M1180 Other specified crystal arthropathies, unspecified site: Secondary | ICD-10-CM | POA: Diagnosis not present

## 2016-01-13 DIAGNOSIS — M25561 Pain in right knee: Secondary | ICD-10-CM | POA: Diagnosis not present

## 2016-02-04 DIAGNOSIS — M1180 Other specified crystal arthropathies, unspecified site: Secondary | ICD-10-CM | POA: Diagnosis not present

## 2016-02-04 DIAGNOSIS — M109 Gout, unspecified: Secondary | ICD-10-CM | POA: Diagnosis not present

## 2016-02-04 DIAGNOSIS — M25569 Pain in unspecified knee: Secondary | ICD-10-CM | POA: Diagnosis not present

## 2016-02-04 DIAGNOSIS — M170 Bilateral primary osteoarthritis of knee: Secondary | ICD-10-CM | POA: Diagnosis not present

## 2016-02-29 DIAGNOSIS — M7020 Olecranon bursitis, unspecified elbow: Secondary | ICD-10-CM | POA: Diagnosis not present

## 2016-02-29 DIAGNOSIS — M25569 Pain in unspecified knee: Secondary | ICD-10-CM | POA: Diagnosis not present

## 2016-02-29 DIAGNOSIS — M170 Bilateral primary osteoarthritis of knee: Secondary | ICD-10-CM | POA: Diagnosis not present

## 2016-02-29 DIAGNOSIS — M109 Gout, unspecified: Secondary | ICD-10-CM | POA: Diagnosis not present

## 2016-02-29 DIAGNOSIS — M1180 Other specified crystal arthropathies, unspecified site: Secondary | ICD-10-CM | POA: Diagnosis not present

## 2016-03-09 DIAGNOSIS — H401123 Primary open-angle glaucoma, left eye, severe stage: Secondary | ICD-10-CM | POA: Diagnosis not present

## 2016-03-09 DIAGNOSIS — H401112 Primary open-angle glaucoma, right eye, moderate stage: Secondary | ICD-10-CM | POA: Diagnosis not present

## 2016-03-16 DIAGNOSIS — R0781 Pleurodynia: Secondary | ICD-10-CM | POA: Diagnosis not present

## 2016-03-16 DIAGNOSIS — M170 Bilateral primary osteoarthritis of knee: Secondary | ICD-10-CM | POA: Diagnosis not present

## 2016-03-29 DIAGNOSIS — I10 Essential (primary) hypertension: Secondary | ICD-10-CM | POA: Diagnosis not present

## 2016-03-29 DIAGNOSIS — E0789 Other specified disorders of thyroid: Secondary | ICD-10-CM | POA: Diagnosis not present

## 2016-03-29 DIAGNOSIS — Z125 Encounter for screening for malignant neoplasm of prostate: Secondary | ICD-10-CM | POA: Diagnosis not present

## 2016-03-29 DIAGNOSIS — E7800 Pure hypercholesterolemia, unspecified: Secondary | ICD-10-CM | POA: Diagnosis not present

## 2016-03-30 DIAGNOSIS — M7020 Olecranon bursitis, unspecified elbow: Secondary | ICD-10-CM | POA: Diagnosis not present

## 2016-03-30 DIAGNOSIS — M109 Gout, unspecified: Secondary | ICD-10-CM | POA: Diagnosis not present

## 2016-03-30 DIAGNOSIS — M1180 Other specified crystal arthropathies, unspecified site: Secondary | ICD-10-CM | POA: Diagnosis not present

## 2016-03-30 DIAGNOSIS — M170 Bilateral primary osteoarthritis of knee: Secondary | ICD-10-CM | POA: Diagnosis not present

## 2016-04-01 DIAGNOSIS — H401123 Primary open-angle glaucoma, left eye, severe stage: Secondary | ICD-10-CM | POA: Diagnosis not present

## 2016-04-05 DIAGNOSIS — Z86711 Personal history of pulmonary embolism: Secondary | ICD-10-CM | POA: Diagnosis not present

## 2016-04-19 DIAGNOSIS — M1990 Unspecified osteoarthritis, unspecified site: Secondary | ICD-10-CM | POA: Diagnosis not present

## 2016-04-19 DIAGNOSIS — E032 Hypothyroidism due to medicaments and other exogenous substances: Secondary | ICD-10-CM | POA: Diagnosis not present

## 2016-04-19 DIAGNOSIS — I10 Essential (primary) hypertension: Secondary | ICD-10-CM | POA: Diagnosis not present

## 2016-04-19 DIAGNOSIS — N400 Enlarged prostate without lower urinary tract symptoms: Secondary | ICD-10-CM | POA: Diagnosis not present

## 2016-04-20 DIAGNOSIS — M170 Bilateral primary osteoarthritis of knee: Secondary | ICD-10-CM | POA: Diagnosis not present

## 2016-04-21 DIAGNOSIS — M109 Gout, unspecified: Secondary | ICD-10-CM | POA: Diagnosis not present

## 2016-04-21 DIAGNOSIS — M25569 Pain in unspecified knee: Secondary | ICD-10-CM | POA: Diagnosis not present

## 2016-04-21 DIAGNOSIS — M7020 Olecranon bursitis, unspecified elbow: Secondary | ICD-10-CM | POA: Diagnosis not present

## 2016-04-21 DIAGNOSIS — M170 Bilateral primary osteoarthritis of knee: Secondary | ICD-10-CM | POA: Diagnosis not present

## 2016-04-21 DIAGNOSIS — M1180 Other specified crystal arthropathies, unspecified site: Secondary | ICD-10-CM | POA: Diagnosis not present

## 2016-05-13 DIAGNOSIS — H401123 Primary open-angle glaucoma, left eye, severe stage: Secondary | ICD-10-CM | POA: Diagnosis not present

## 2016-06-09 DIAGNOSIS — M170 Bilateral primary osteoarthritis of knee: Secondary | ICD-10-CM | POA: Diagnosis not present

## 2016-06-09 DIAGNOSIS — M7020 Olecranon bursitis, unspecified elbow: Secondary | ICD-10-CM | POA: Diagnosis not present

## 2016-06-09 DIAGNOSIS — M109 Gout, unspecified: Secondary | ICD-10-CM | POA: Diagnosis not present

## 2016-06-09 DIAGNOSIS — M1180 Other specified crystal arthropathies, unspecified site: Secondary | ICD-10-CM | POA: Diagnosis not present

## 2016-06-09 DIAGNOSIS — M25561 Pain in right knee: Secondary | ICD-10-CM | POA: Diagnosis not present

## 2016-06-09 DIAGNOSIS — M25562 Pain in left knee: Secondary | ICD-10-CM | POA: Diagnosis not present

## 2016-06-09 DIAGNOSIS — Z79899 Other long term (current) drug therapy: Secondary | ICD-10-CM | POA: Diagnosis not present

## 2016-07-04 DIAGNOSIS — H401123 Primary open-angle glaucoma, left eye, severe stage: Secondary | ICD-10-CM | POA: Diagnosis not present

## 2016-07-04 DIAGNOSIS — H401122 Primary open-angle glaucoma, left eye, moderate stage: Secondary | ICD-10-CM | POA: Diagnosis not present

## 2016-07-04 DIAGNOSIS — H5213 Myopia, bilateral: Secondary | ICD-10-CM | POA: Diagnosis not present

## 2016-07-04 DIAGNOSIS — H524 Presbyopia: Secondary | ICD-10-CM | POA: Diagnosis not present

## 2016-07-04 DIAGNOSIS — H52203 Unspecified astigmatism, bilateral: Secondary | ICD-10-CM | POA: Diagnosis not present

## 2016-08-11 DIAGNOSIS — M1180 Other specified crystal arthropathies, unspecified site: Secondary | ICD-10-CM | POA: Diagnosis not present

## 2016-08-11 DIAGNOSIS — Z79899 Other long term (current) drug therapy: Secondary | ICD-10-CM | POA: Diagnosis not present

## 2016-08-11 DIAGNOSIS — Z23 Encounter for immunization: Secondary | ICD-10-CM | POA: Diagnosis not present

## 2016-08-11 DIAGNOSIS — M25561 Pain in right knee: Secondary | ICD-10-CM | POA: Diagnosis not present

## 2016-08-11 DIAGNOSIS — M109 Gout, unspecified: Secondary | ICD-10-CM | POA: Diagnosis not present

## 2016-08-11 DIAGNOSIS — M170 Bilateral primary osteoarthritis of knee: Secondary | ICD-10-CM | POA: Diagnosis not present

## 2016-08-11 DIAGNOSIS — M25562 Pain in left knee: Secondary | ICD-10-CM | POA: Diagnosis not present

## 2016-10-12 DIAGNOSIS — M170 Bilateral primary osteoarthritis of knee: Secondary | ICD-10-CM | POA: Diagnosis not present

## 2016-10-12 DIAGNOSIS — Z79899 Other long term (current) drug therapy: Secondary | ICD-10-CM | POA: Diagnosis not present

## 2016-10-12 DIAGNOSIS — M109 Gout, unspecified: Secondary | ICD-10-CM | POA: Diagnosis not present

## 2016-10-12 DIAGNOSIS — M25561 Pain in right knee: Secondary | ICD-10-CM | POA: Diagnosis not present

## 2016-10-12 DIAGNOSIS — M1180 Other specified crystal arthropathies, unspecified site: Secondary | ICD-10-CM | POA: Diagnosis not present

## 2016-10-19 DIAGNOSIS — I10 Essential (primary) hypertension: Secondary | ICD-10-CM | POA: Diagnosis not present

## 2016-10-19 DIAGNOSIS — M25569 Pain in unspecified knee: Secondary | ICD-10-CM | POA: Diagnosis not present

## 2016-10-19 DIAGNOSIS — M109 Gout, unspecified: Secondary | ICD-10-CM | POA: Diagnosis not present

## 2016-11-09 DIAGNOSIS — H401123 Primary open-angle glaucoma, left eye, severe stage: Secondary | ICD-10-CM | POA: Diagnosis not present

## 2016-11-09 DIAGNOSIS — H401112 Primary open-angle glaucoma, right eye, moderate stage: Secondary | ICD-10-CM | POA: Diagnosis not present

## 2016-11-10 DIAGNOSIS — M109 Gout, unspecified: Secondary | ICD-10-CM | POA: Diagnosis not present

## 2016-11-10 DIAGNOSIS — M170 Bilateral primary osteoarthritis of knee: Secondary | ICD-10-CM | POA: Diagnosis not present

## 2016-11-10 DIAGNOSIS — Z79899 Other long term (current) drug therapy: Secondary | ICD-10-CM | POA: Diagnosis not present

## 2016-11-10 DIAGNOSIS — M25562 Pain in left knee: Secondary | ICD-10-CM | POA: Diagnosis not present

## 2016-11-10 DIAGNOSIS — M1180 Other specified crystal arthropathies, unspecified site: Secondary | ICD-10-CM | POA: Diagnosis not present

## 2016-12-22 DIAGNOSIS — M109 Gout, unspecified: Secondary | ICD-10-CM | POA: Diagnosis not present

## 2016-12-22 DIAGNOSIS — M25561 Pain in right knee: Secondary | ICD-10-CM | POA: Diagnosis not present

## 2016-12-22 DIAGNOSIS — M1180 Other specified crystal arthropathies, unspecified site: Secondary | ICD-10-CM | POA: Diagnosis not present

## 2016-12-22 DIAGNOSIS — M170 Bilateral primary osteoarthritis of knee: Secondary | ICD-10-CM | POA: Diagnosis not present

## 2016-12-22 DIAGNOSIS — Z79899 Other long term (current) drug therapy: Secondary | ICD-10-CM | POA: Diagnosis not present

## 2017-01-05 DIAGNOSIS — M170 Bilateral primary osteoarthritis of knee: Secondary | ICD-10-CM | POA: Diagnosis not present

## 2017-01-05 DIAGNOSIS — M1180 Other specified crystal arthropathies, unspecified site: Secondary | ICD-10-CM | POA: Diagnosis not present

## 2017-01-05 DIAGNOSIS — Z79899 Other long term (current) drug therapy: Secondary | ICD-10-CM | POA: Diagnosis not present

## 2017-01-05 DIAGNOSIS — M109 Gout, unspecified: Secondary | ICD-10-CM | POA: Diagnosis not present

## 2017-01-19 DIAGNOSIS — M1180 Other specified crystal arthropathies, unspecified site: Secondary | ICD-10-CM | POA: Diagnosis not present

## 2017-01-19 DIAGNOSIS — M109 Gout, unspecified: Secondary | ICD-10-CM | POA: Diagnosis not present

## 2017-01-19 DIAGNOSIS — M170 Bilateral primary osteoarthritis of knee: Secondary | ICD-10-CM | POA: Diagnosis not present

## 2017-01-19 DIAGNOSIS — Z79899 Other long term (current) drug therapy: Secondary | ICD-10-CM | POA: Diagnosis not present

## 2017-02-02 DIAGNOSIS — M25562 Pain in left knee: Secondary | ICD-10-CM | POA: Diagnosis not present

## 2017-02-02 DIAGNOSIS — M170 Bilateral primary osteoarthritis of knee: Secondary | ICD-10-CM | POA: Diagnosis not present

## 2017-02-02 DIAGNOSIS — M25561 Pain in right knee: Secondary | ICD-10-CM | POA: Diagnosis not present

## 2017-02-02 DIAGNOSIS — M25461 Effusion, right knee: Secondary | ICD-10-CM | POA: Diagnosis not present

## 2017-02-02 DIAGNOSIS — R262 Difficulty in walking, not elsewhere classified: Secondary | ICD-10-CM | POA: Diagnosis not present

## 2017-02-06 DIAGNOSIS — M25562 Pain in left knee: Secondary | ICD-10-CM | POA: Diagnosis not present

## 2017-02-06 DIAGNOSIS — M170 Bilateral primary osteoarthritis of knee: Secondary | ICD-10-CM | POA: Diagnosis not present

## 2017-02-06 DIAGNOSIS — M25561 Pain in right knee: Secondary | ICD-10-CM | POA: Diagnosis not present

## 2017-02-15 DIAGNOSIS — M170 Bilateral primary osteoarthritis of knee: Secondary | ICD-10-CM | POA: Diagnosis not present

## 2017-02-15 DIAGNOSIS — M25562 Pain in left knee: Secondary | ICD-10-CM | POA: Diagnosis not present

## 2017-02-15 DIAGNOSIS — M25561 Pain in right knee: Secondary | ICD-10-CM | POA: Diagnosis not present

## 2017-02-22 DIAGNOSIS — M25562 Pain in left knee: Secondary | ICD-10-CM | POA: Diagnosis not present

## 2017-02-22 DIAGNOSIS — M170 Bilateral primary osteoarthritis of knee: Secondary | ICD-10-CM | POA: Diagnosis not present

## 2017-02-22 DIAGNOSIS — M25561 Pain in right knee: Secondary | ICD-10-CM | POA: Diagnosis not present

## 2017-03-02 DIAGNOSIS — M25561 Pain in right knee: Secondary | ICD-10-CM | POA: Diagnosis not present

## 2017-03-02 DIAGNOSIS — M25461 Effusion, right knee: Secondary | ICD-10-CM | POA: Diagnosis not present

## 2017-03-02 DIAGNOSIS — M25562 Pain in left knee: Secondary | ICD-10-CM | POA: Diagnosis not present

## 2017-03-02 DIAGNOSIS — M170 Bilateral primary osteoarthritis of knee: Secondary | ICD-10-CM | POA: Diagnosis not present

## 2017-03-10 DIAGNOSIS — H401122 Primary open-angle glaucoma, left eye, moderate stage: Secondary | ICD-10-CM | POA: Diagnosis not present

## 2017-03-10 DIAGNOSIS — H401111 Primary open-angle glaucoma, right eye, mild stage: Secondary | ICD-10-CM | POA: Diagnosis not present

## 2017-03-15 DIAGNOSIS — Z789 Other specified health status: Secondary | ICD-10-CM | POA: Diagnosis not present

## 2017-03-15 DIAGNOSIS — M25562 Pain in left knee: Secondary | ICD-10-CM | POA: Diagnosis not present

## 2017-03-15 DIAGNOSIS — M170 Bilateral primary osteoarthritis of knee: Secondary | ICD-10-CM | POA: Diagnosis not present

## 2017-03-15 DIAGNOSIS — M25561 Pain in right knee: Secondary | ICD-10-CM | POA: Diagnosis not present

## 2017-03-15 DIAGNOSIS — Z9229 Personal history of other drug therapy: Secondary | ICD-10-CM | POA: Diagnosis not present

## 2017-04-12 DIAGNOSIS — M170 Bilateral primary osteoarthritis of knee: Secondary | ICD-10-CM | POA: Diagnosis not present

## 2017-04-12 DIAGNOSIS — M25562 Pain in left knee: Secondary | ICD-10-CM | POA: Diagnosis not present

## 2017-04-12 DIAGNOSIS — M25561 Pain in right knee: Secondary | ICD-10-CM | POA: Diagnosis not present

## 2017-04-20 DIAGNOSIS — I10 Essential (primary) hypertension: Secondary | ICD-10-CM | POA: Diagnosis not present

## 2017-04-20 DIAGNOSIS — E119 Type 2 diabetes mellitus without complications: Secondary | ICD-10-CM | POA: Diagnosis not present

## 2017-04-20 DIAGNOSIS — E0789 Other specified disorders of thyroid: Secondary | ICD-10-CM | POA: Diagnosis not present

## 2017-04-27 DIAGNOSIS — I10 Essential (primary) hypertension: Secondary | ICD-10-CM | POA: Diagnosis not present

## 2017-04-27 DIAGNOSIS — M109 Gout, unspecified: Secondary | ICD-10-CM | POA: Diagnosis not present

## 2017-05-10 DIAGNOSIS — M25561 Pain in right knee: Secondary | ICD-10-CM | POA: Diagnosis not present

## 2017-05-10 DIAGNOSIS — M25461 Effusion, right knee: Secondary | ICD-10-CM | POA: Diagnosis not present

## 2017-05-10 DIAGNOSIS — M25562 Pain in left knee: Secondary | ICD-10-CM | POA: Diagnosis not present

## 2017-05-10 DIAGNOSIS — M25462 Effusion, left knee: Secondary | ICD-10-CM | POA: Diagnosis not present

## 2017-05-10 DIAGNOSIS — M170 Bilateral primary osteoarthritis of knee: Secondary | ICD-10-CM | POA: Diagnosis not present

## 2017-06-07 DIAGNOSIS — M25562 Pain in left knee: Secondary | ICD-10-CM | POA: Diagnosis not present

## 2017-06-07 DIAGNOSIS — M25461 Effusion, right knee: Secondary | ICD-10-CM | POA: Diagnosis not present

## 2017-06-07 DIAGNOSIS — M170 Bilateral primary osteoarthritis of knee: Secondary | ICD-10-CM | POA: Diagnosis not present

## 2017-06-07 DIAGNOSIS — M25561 Pain in right knee: Secondary | ICD-10-CM | POA: Diagnosis not present

## 2017-06-14 DIAGNOSIS — M170 Bilateral primary osteoarthritis of knee: Secondary | ICD-10-CM | POA: Diagnosis not present

## 2017-06-14 DIAGNOSIS — M25561 Pain in right knee: Secondary | ICD-10-CM | POA: Diagnosis not present

## 2017-06-14 DIAGNOSIS — M25562 Pain in left knee: Secondary | ICD-10-CM | POA: Diagnosis not present

## 2017-06-21 DIAGNOSIS — M25562 Pain in left knee: Secondary | ICD-10-CM | POA: Diagnosis not present

## 2017-06-21 DIAGNOSIS — M25561 Pain in right knee: Secondary | ICD-10-CM | POA: Diagnosis not present

## 2017-06-21 DIAGNOSIS — M170 Bilateral primary osteoarthritis of knee: Secondary | ICD-10-CM | POA: Diagnosis not present

## 2017-07-05 DIAGNOSIS — M25561 Pain in right knee: Secondary | ICD-10-CM | POA: Diagnosis not present

## 2017-07-05 DIAGNOSIS — M170 Bilateral primary osteoarthritis of knee: Secondary | ICD-10-CM | POA: Diagnosis not present

## 2017-07-05 DIAGNOSIS — M25562 Pain in left knee: Secondary | ICD-10-CM | POA: Diagnosis not present

## 2017-07-11 DIAGNOSIS — M25562 Pain in left knee: Secondary | ICD-10-CM | POA: Diagnosis not present

## 2017-07-11 DIAGNOSIS — M170 Bilateral primary osteoarthritis of knee: Secondary | ICD-10-CM | POA: Diagnosis not present

## 2017-07-11 DIAGNOSIS — Z79899 Other long term (current) drug therapy: Secondary | ICD-10-CM | POA: Diagnosis not present

## 2017-07-11 DIAGNOSIS — M109 Gout, unspecified: Secondary | ICD-10-CM | POA: Diagnosis not present

## 2017-07-11 DIAGNOSIS — M25561 Pain in right knee: Secondary | ICD-10-CM | POA: Diagnosis not present

## 2017-07-11 DIAGNOSIS — M1180 Other specified crystal arthropathies, unspecified site: Secondary | ICD-10-CM | POA: Diagnosis not present

## 2017-07-26 DIAGNOSIS — I10 Essential (primary) hypertension: Secondary | ICD-10-CM | POA: Diagnosis not present

## 2017-08-02 DIAGNOSIS — Z23 Encounter for immunization: Secondary | ICD-10-CM | POA: Diagnosis not present

## 2017-08-02 DIAGNOSIS — E1121 Type 2 diabetes mellitus with diabetic nephropathy: Secondary | ICD-10-CM | POA: Diagnosis not present

## 2017-08-02 DIAGNOSIS — G4733 Obstructive sleep apnea (adult) (pediatric): Secondary | ICD-10-CM | POA: Diagnosis not present

## 2017-08-02 DIAGNOSIS — E039 Hypothyroidism, unspecified: Secondary | ICD-10-CM | POA: Diagnosis not present

## 2017-08-02 DIAGNOSIS — D649 Anemia, unspecified: Secondary | ICD-10-CM | POA: Diagnosis not present

## 2017-08-02 DIAGNOSIS — E119 Type 2 diabetes mellitus without complications: Secondary | ICD-10-CM | POA: Diagnosis not present

## 2017-08-02 DIAGNOSIS — I10 Essential (primary) hypertension: Secondary | ICD-10-CM | POA: Diagnosis not present

## 2017-08-07 DIAGNOSIS — Z86711 Personal history of pulmonary embolism: Secondary | ICD-10-CM | POA: Diagnosis not present

## 2017-08-07 DIAGNOSIS — E1121 Type 2 diabetes mellitus with diabetic nephropathy: Secondary | ICD-10-CM | POA: Diagnosis not present

## 2017-08-11 DIAGNOSIS — H401131 Primary open-angle glaucoma, bilateral, mild stage: Secondary | ICD-10-CM | POA: Diagnosis not present

## 2017-08-11 DIAGNOSIS — H353130 Nonexudative age-related macular degeneration, bilateral, stage unspecified: Secondary | ICD-10-CM | POA: Diagnosis not present

## 2017-08-22 DIAGNOSIS — M1180 Other specified crystal arthropathies, unspecified site: Secondary | ICD-10-CM | POA: Diagnosis not present

## 2017-08-22 DIAGNOSIS — M25561 Pain in right knee: Secondary | ICD-10-CM | POA: Diagnosis not present

## 2017-08-22 DIAGNOSIS — Z79899 Other long term (current) drug therapy: Secondary | ICD-10-CM | POA: Diagnosis not present

## 2017-08-22 DIAGNOSIS — M25461 Effusion, right knee: Secondary | ICD-10-CM | POA: Diagnosis not present

## 2017-08-22 DIAGNOSIS — M170 Bilateral primary osteoarthritis of knee: Secondary | ICD-10-CM | POA: Diagnosis not present

## 2017-08-22 DIAGNOSIS — M109 Gout, unspecified: Secondary | ICD-10-CM | POA: Diagnosis not present

## 2017-08-22 DIAGNOSIS — M25562 Pain in left knee: Secondary | ICD-10-CM | POA: Diagnosis not present

## 2017-08-29 DIAGNOSIS — M1712 Unilateral primary osteoarthritis, left knee: Secondary | ICD-10-CM | POA: Diagnosis not present

## 2017-08-29 DIAGNOSIS — M109 Gout, unspecified: Secondary | ICD-10-CM | POA: Diagnosis not present

## 2017-08-29 DIAGNOSIS — Z79899 Other long term (current) drug therapy: Secondary | ICD-10-CM | POA: Diagnosis not present

## 2017-08-29 DIAGNOSIS — M25562 Pain in left knee: Secondary | ICD-10-CM | POA: Diagnosis not present

## 2017-08-29 DIAGNOSIS — M170 Bilateral primary osteoarthritis of knee: Secondary | ICD-10-CM | POA: Diagnosis not present

## 2017-08-29 DIAGNOSIS — M1180 Other specified crystal arthropathies, unspecified site: Secondary | ICD-10-CM | POA: Diagnosis not present

## 2017-08-29 DIAGNOSIS — M25561 Pain in right knee: Secondary | ICD-10-CM | POA: Diagnosis not present

## 2017-09-18 DIAGNOSIS — R8279 Other abnormal findings on microbiological examination of urine: Secondary | ICD-10-CM | POA: Diagnosis not present

## 2017-09-18 DIAGNOSIS — N401 Enlarged prostate with lower urinary tract symptoms: Secondary | ICD-10-CM | POA: Diagnosis not present

## 2017-09-18 DIAGNOSIS — N138 Other obstructive and reflux uropathy: Secondary | ICD-10-CM | POA: Diagnosis not present

## 2017-09-18 DIAGNOSIS — N400 Enlarged prostate without lower urinary tract symptoms: Secondary | ICD-10-CM | POA: Diagnosis not present

## 2017-10-30 DIAGNOSIS — E039 Hypothyroidism, unspecified: Secondary | ICD-10-CM | POA: Diagnosis not present

## 2017-10-30 DIAGNOSIS — E1121 Type 2 diabetes mellitus with diabetic nephropathy: Secondary | ICD-10-CM | POA: Diagnosis not present

## 2017-10-30 DIAGNOSIS — R946 Abnormal results of thyroid function studies: Secondary | ICD-10-CM | POA: Diagnosis not present

## 2017-10-30 DIAGNOSIS — E049 Nontoxic goiter, unspecified: Secondary | ICD-10-CM | POA: Diagnosis not present

## 2017-10-30 DIAGNOSIS — E042 Nontoxic multinodular goiter: Secondary | ICD-10-CM | POA: Diagnosis not present

## 2017-10-31 DIAGNOSIS — M25562 Pain in left knee: Secondary | ICD-10-CM | POA: Diagnosis not present

## 2017-10-31 DIAGNOSIS — Z79899 Other long term (current) drug therapy: Secondary | ICD-10-CM | POA: Diagnosis not present

## 2017-10-31 DIAGNOSIS — M1180 Other specified crystal arthropathies, unspecified site: Secondary | ICD-10-CM | POA: Diagnosis not present

## 2017-10-31 DIAGNOSIS — M109 Gout, unspecified: Secondary | ICD-10-CM | POA: Diagnosis not present

## 2017-10-31 DIAGNOSIS — M170 Bilateral primary osteoarthritis of knee: Secondary | ICD-10-CM | POA: Diagnosis not present

## 2017-10-31 DIAGNOSIS — M1712 Unilateral primary osteoarthritis, left knee: Secondary | ICD-10-CM | POA: Diagnosis not present

## 2017-10-31 DIAGNOSIS — M25561 Pain in right knee: Secondary | ICD-10-CM | POA: Diagnosis not present

## 2017-11-15 DIAGNOSIS — M25561 Pain in right knee: Secondary | ICD-10-CM | POA: Diagnosis not present

## 2017-11-15 DIAGNOSIS — M1712 Unilateral primary osteoarthritis, left knee: Secondary | ICD-10-CM | POA: Diagnosis not present

## 2017-11-15 DIAGNOSIS — Z79899 Other long term (current) drug therapy: Secondary | ICD-10-CM | POA: Diagnosis not present

## 2017-11-15 DIAGNOSIS — M25562 Pain in left knee: Secondary | ICD-10-CM | POA: Diagnosis not present

## 2017-11-15 DIAGNOSIS — M1180 Other specified crystal arthropathies, unspecified site: Secondary | ICD-10-CM | POA: Diagnosis not present

## 2017-11-15 DIAGNOSIS — M109 Gout, unspecified: Secondary | ICD-10-CM | POA: Diagnosis not present

## 2017-11-15 DIAGNOSIS — M170 Bilateral primary osteoarthritis of knee: Secondary | ICD-10-CM | POA: Diagnosis not present

## 2017-11-24 DIAGNOSIS — E0789 Other specified disorders of thyroid: Secondary | ICD-10-CM | POA: Diagnosis not present

## 2017-11-24 DIAGNOSIS — E119 Type 2 diabetes mellitus without complications: Secondary | ICD-10-CM | POA: Diagnosis not present

## 2017-11-24 DIAGNOSIS — N390 Urinary tract infection, site not specified: Secondary | ICD-10-CM | POA: Diagnosis not present

## 2017-11-24 DIAGNOSIS — E042 Nontoxic multinodular goiter: Secondary | ICD-10-CM | POA: Diagnosis not present

## 2017-11-24 DIAGNOSIS — I10 Essential (primary) hypertension: Secondary | ICD-10-CM | POA: Diagnosis not present

## 2017-11-24 DIAGNOSIS — Z0000 Encounter for general adult medical examination without abnormal findings: Secondary | ICD-10-CM | POA: Diagnosis not present

## 2017-11-28 DIAGNOSIS — D509 Iron deficiency anemia, unspecified: Secondary | ICD-10-CM | POA: Diagnosis not present

## 2017-11-29 DIAGNOSIS — Z23 Encounter for immunization: Secondary | ICD-10-CM | POA: Diagnosis not present

## 2017-11-29 DIAGNOSIS — M109 Gout, unspecified: Secondary | ICD-10-CM | POA: Diagnosis not present

## 2017-11-29 DIAGNOSIS — E7800 Pure hypercholesterolemia, unspecified: Secondary | ICD-10-CM | POA: Diagnosis not present

## 2017-11-29 DIAGNOSIS — M25569 Pain in unspecified knee: Secondary | ICD-10-CM | POA: Diagnosis not present

## 2017-11-29 DIAGNOSIS — Z7901 Long term (current) use of anticoagulants: Secondary | ICD-10-CM | POA: Diagnosis not present

## 2017-11-29 DIAGNOSIS — Z9989 Dependence on other enabling machines and devices: Secondary | ICD-10-CM | POA: Diagnosis not present

## 2017-11-29 DIAGNOSIS — M170 Bilateral primary osteoarthritis of knee: Secondary | ICD-10-CM | POA: Diagnosis not present

## 2017-11-29 DIAGNOSIS — M1180 Other specified crystal arthropathies, unspecified site: Secondary | ICD-10-CM | POA: Diagnosis not present

## 2017-11-29 DIAGNOSIS — D649 Anemia, unspecified: Secondary | ICD-10-CM | POA: Diagnosis not present

## 2017-11-29 DIAGNOSIS — G4733 Obstructive sleep apnea (adult) (pediatric): Secondary | ICD-10-CM | POA: Diagnosis not present

## 2017-11-29 DIAGNOSIS — E1121 Type 2 diabetes mellitus with diabetic nephropathy: Secondary | ICD-10-CM | POA: Diagnosis not present

## 2017-11-29 DIAGNOSIS — N183 Chronic kidney disease, stage 3 (moderate): Secondary | ICD-10-CM | POA: Diagnosis not present

## 2017-11-30 DIAGNOSIS — M170 Bilateral primary osteoarthritis of knee: Secondary | ICD-10-CM | POA: Diagnosis not present

## 2017-11-30 DIAGNOSIS — M109 Gout, unspecified: Secondary | ICD-10-CM | POA: Diagnosis not present

## 2017-11-30 DIAGNOSIS — M25562 Pain in left knee: Secondary | ICD-10-CM | POA: Diagnosis not present

## 2017-11-30 DIAGNOSIS — M1180 Other specified crystal arthropathies, unspecified site: Secondary | ICD-10-CM | POA: Diagnosis not present

## 2017-11-30 DIAGNOSIS — M25561 Pain in right knee: Secondary | ICD-10-CM | POA: Diagnosis not present

## 2017-11-30 DIAGNOSIS — M1712 Unilateral primary osteoarthritis, left knee: Secondary | ICD-10-CM | POA: Diagnosis not present

## 2017-11-30 DIAGNOSIS — Z79899 Other long term (current) drug therapy: Secondary | ICD-10-CM | POA: Diagnosis not present

## 2017-12-08 DIAGNOSIS — Z86711 Personal history of pulmonary embolism: Secondary | ICD-10-CM | POA: Diagnosis not present

## 2017-12-08 DIAGNOSIS — Z9181 History of falling: Secondary | ICD-10-CM | POA: Diagnosis not present

## 2017-12-08 DIAGNOSIS — N529 Male erectile dysfunction, unspecified: Secondary | ICD-10-CM | POA: Diagnosis not present

## 2017-12-11 DIAGNOSIS — R946 Abnormal results of thyroid function studies: Secondary | ICD-10-CM | POA: Diagnosis not present

## 2017-12-11 DIAGNOSIS — E042 Nontoxic multinodular goiter: Secondary | ICD-10-CM | POA: Diagnosis not present

## 2017-12-11 DIAGNOSIS — E049 Nontoxic goiter, unspecified: Secondary | ICD-10-CM | POA: Diagnosis not present

## 2018-01-03 DIAGNOSIS — N401 Enlarged prostate with lower urinary tract symptoms: Secondary | ICD-10-CM | POA: Diagnosis not present

## 2018-01-03 DIAGNOSIS — K219 Gastro-esophageal reflux disease without esophagitis: Secondary | ICD-10-CM | POA: Diagnosis not present

## 2018-01-30 DIAGNOSIS — M1712 Unilateral primary osteoarthritis, left knee: Secondary | ICD-10-CM | POA: Diagnosis not present

## 2018-01-30 DIAGNOSIS — M109 Gout, unspecified: Secondary | ICD-10-CM | POA: Diagnosis not present

## 2018-01-30 DIAGNOSIS — M25561 Pain in right knee: Secondary | ICD-10-CM | POA: Diagnosis not present

## 2018-01-30 DIAGNOSIS — M1180 Other specified crystal arthropathies, unspecified site: Secondary | ICD-10-CM | POA: Diagnosis not present

## 2018-01-30 DIAGNOSIS — M170 Bilateral primary osteoarthritis of knee: Secondary | ICD-10-CM | POA: Diagnosis not present

## 2018-01-30 DIAGNOSIS — Z79899 Other long term (current) drug therapy: Secondary | ICD-10-CM | POA: Diagnosis not present

## 2018-01-30 DIAGNOSIS — E039 Hypothyroidism, unspecified: Secondary | ICD-10-CM | POA: Diagnosis not present

## 2018-01-30 DIAGNOSIS — R946 Abnormal results of thyroid function studies: Secondary | ICD-10-CM | POA: Diagnosis not present

## 2018-01-30 DIAGNOSIS — M25562 Pain in left knee: Secondary | ICD-10-CM | POA: Diagnosis not present

## 2018-02-06 DIAGNOSIS — R946 Abnormal results of thyroid function studies: Secondary | ICD-10-CM | POA: Diagnosis not present

## 2018-02-06 DIAGNOSIS — E7800 Pure hypercholesterolemia, unspecified: Secondary | ICD-10-CM | POA: Diagnosis not present

## 2018-02-06 DIAGNOSIS — E049 Nontoxic goiter, unspecified: Secondary | ICD-10-CM | POA: Diagnosis not present

## 2018-02-06 DIAGNOSIS — N183 Chronic kidney disease, stage 3 (moderate): Secondary | ICD-10-CM | POA: Diagnosis not present

## 2018-02-06 DIAGNOSIS — E042 Nontoxic multinodular goiter: Secondary | ICD-10-CM | POA: Diagnosis not present

## 2018-02-08 DIAGNOSIS — H401131 Primary open-angle glaucoma, bilateral, mild stage: Secondary | ICD-10-CM | POA: Diagnosis not present

## 2018-02-13 DIAGNOSIS — Z79899 Other long term (current) drug therapy: Secondary | ICD-10-CM | POA: Diagnosis not present

## 2018-02-13 DIAGNOSIS — M1180 Other specified crystal arthropathies, unspecified site: Secondary | ICD-10-CM | POA: Diagnosis not present

## 2018-02-13 DIAGNOSIS — M25562 Pain in left knee: Secondary | ICD-10-CM | POA: Diagnosis not present

## 2018-02-13 DIAGNOSIS — M1712 Unilateral primary osteoarthritis, left knee: Secondary | ICD-10-CM | POA: Diagnosis not present

## 2018-02-13 DIAGNOSIS — M25561 Pain in right knee: Secondary | ICD-10-CM | POA: Diagnosis not present

## 2018-02-13 DIAGNOSIS — M170 Bilateral primary osteoarthritis of knee: Secondary | ICD-10-CM | POA: Diagnosis not present

## 2018-02-13 DIAGNOSIS — M109 Gout, unspecified: Secondary | ICD-10-CM | POA: Diagnosis not present

## 2018-04-23 DIAGNOSIS — M109 Gout, unspecified: Secondary | ICD-10-CM | POA: Diagnosis not present

## 2018-04-23 DIAGNOSIS — M1180 Other specified crystal arthropathies, unspecified site: Secondary | ICD-10-CM | POA: Diagnosis not present

## 2018-04-23 DIAGNOSIS — M25562 Pain in left knee: Secondary | ICD-10-CM | POA: Diagnosis not present

## 2018-04-23 DIAGNOSIS — M25561 Pain in right knee: Secondary | ICD-10-CM | POA: Diagnosis not present

## 2018-04-23 DIAGNOSIS — M170 Bilateral primary osteoarthritis of knee: Secondary | ICD-10-CM | POA: Diagnosis not present

## 2018-04-23 DIAGNOSIS — Z79899 Other long term (current) drug therapy: Secondary | ICD-10-CM | POA: Diagnosis not present

## 2018-05-15 DIAGNOSIS — M1180 Other specified crystal arthropathies, unspecified site: Secondary | ICD-10-CM | POA: Diagnosis not present

## 2018-05-15 DIAGNOSIS — M170 Bilateral primary osteoarthritis of knee: Secondary | ICD-10-CM | POA: Diagnosis not present

## 2018-05-15 DIAGNOSIS — M109 Gout, unspecified: Secondary | ICD-10-CM | POA: Diagnosis not present

## 2018-05-15 DIAGNOSIS — M25561 Pain in right knee: Secondary | ICD-10-CM | POA: Diagnosis not present

## 2018-05-15 DIAGNOSIS — M25562 Pain in left knee: Secondary | ICD-10-CM | POA: Diagnosis not present

## 2018-05-15 DIAGNOSIS — Z79899 Other long term (current) drug therapy: Secondary | ICD-10-CM | POA: Diagnosis not present

## 2018-07-19 DIAGNOSIS — M25561 Pain in right knee: Secondary | ICD-10-CM | POA: Diagnosis not present

## 2018-07-19 DIAGNOSIS — M25562 Pain in left knee: Secondary | ICD-10-CM | POA: Diagnosis not present

## 2018-07-19 DIAGNOSIS — Z79899 Other long term (current) drug therapy: Secondary | ICD-10-CM | POA: Diagnosis not present

## 2018-07-19 DIAGNOSIS — Z23 Encounter for immunization: Secondary | ICD-10-CM | POA: Diagnosis not present

## 2018-07-19 DIAGNOSIS — M170 Bilateral primary osteoarthritis of knee: Secondary | ICD-10-CM | POA: Diagnosis not present

## 2018-07-19 DIAGNOSIS — M109 Gout, unspecified: Secondary | ICD-10-CM | POA: Diagnosis not present

## 2018-07-19 DIAGNOSIS — M25461 Effusion, right knee: Secondary | ICD-10-CM | POA: Diagnosis not present

## 2018-07-19 DIAGNOSIS — M1180 Other specified crystal arthropathies, unspecified site: Secondary | ICD-10-CM | POA: Diagnosis not present

## 2018-08-01 DIAGNOSIS — E042 Nontoxic multinodular goiter: Secondary | ICD-10-CM | POA: Diagnosis not present

## 2018-08-01 DIAGNOSIS — R946 Abnormal results of thyroid function studies: Secondary | ICD-10-CM | POA: Diagnosis not present

## 2018-08-08 DIAGNOSIS — E7800 Pure hypercholesterolemia, unspecified: Secondary | ICD-10-CM | POA: Diagnosis not present

## 2018-08-08 DIAGNOSIS — R946 Abnormal results of thyroid function studies: Secondary | ICD-10-CM | POA: Diagnosis not present

## 2018-08-08 DIAGNOSIS — E042 Nontoxic multinodular goiter: Secondary | ICD-10-CM | POA: Diagnosis not present

## 2018-08-08 DIAGNOSIS — E049 Nontoxic goiter, unspecified: Secondary | ICD-10-CM | POA: Diagnosis not present

## 2018-08-08 DIAGNOSIS — N183 Chronic kidney disease, stage 3 (moderate): Secondary | ICD-10-CM | POA: Diagnosis not present

## 2018-08-14 DIAGNOSIS — H401131 Primary open-angle glaucoma, bilateral, mild stage: Secondary | ICD-10-CM | POA: Diagnosis not present

## 2018-09-18 DIAGNOSIS — M109 Gout, unspecified: Secondary | ICD-10-CM | POA: Diagnosis not present

## 2018-09-18 DIAGNOSIS — M25461 Effusion, right knee: Secondary | ICD-10-CM | POA: Diagnosis not present

## 2018-09-18 DIAGNOSIS — M25562 Pain in left knee: Secondary | ICD-10-CM | POA: Diagnosis not present

## 2018-09-18 DIAGNOSIS — M25561 Pain in right knee: Secondary | ICD-10-CM | POA: Diagnosis not present

## 2018-09-18 DIAGNOSIS — Z79899 Other long term (current) drug therapy: Secondary | ICD-10-CM | POA: Diagnosis not present

## 2018-09-18 DIAGNOSIS — M170 Bilateral primary osteoarthritis of knee: Secondary | ICD-10-CM | POA: Diagnosis not present

## 2018-09-18 DIAGNOSIS — Z13820 Encounter for screening for osteoporosis: Secondary | ICD-10-CM | POA: Diagnosis not present

## 2018-09-18 DIAGNOSIS — M1180 Other specified crystal arthropathies, unspecified site: Secondary | ICD-10-CM | POA: Diagnosis not present

## 2018-10-09 DIAGNOSIS — Z125 Encounter for screening for malignant neoplasm of prostate: Secondary | ICD-10-CM | POA: Diagnosis not present

## 2018-10-09 DIAGNOSIS — R310 Gross hematuria: Secondary | ICD-10-CM | POA: Diagnosis not present

## 2018-11-09 DIAGNOSIS — R946 Abnormal results of thyroid function studies: Secondary | ICD-10-CM | POA: Diagnosis not present

## 2018-11-19 DIAGNOSIS — E7800 Pure hypercholesterolemia, unspecified: Secondary | ICD-10-CM | POA: Diagnosis not present

## 2018-11-19 DIAGNOSIS — E042 Nontoxic multinodular goiter: Secondary | ICD-10-CM | POA: Diagnosis not present

## 2018-11-19 DIAGNOSIS — E049 Nontoxic goiter, unspecified: Secondary | ICD-10-CM | POA: Diagnosis not present

## 2018-11-19 DIAGNOSIS — R946 Abnormal results of thyroid function studies: Secondary | ICD-10-CM | POA: Diagnosis not present

## 2018-11-19 DIAGNOSIS — N183 Chronic kidney disease, stage 3 (moderate): Secondary | ICD-10-CM | POA: Diagnosis not present

## 2018-11-23 DIAGNOSIS — N390 Urinary tract infection, site not specified: Secondary | ICD-10-CM | POA: Diagnosis not present

## 2018-11-29 DIAGNOSIS — E039 Hypothyroidism, unspecified: Secondary | ICD-10-CM | POA: Diagnosis not present

## 2018-11-29 DIAGNOSIS — E7800 Pure hypercholesterolemia, unspecified: Secondary | ICD-10-CM | POA: Diagnosis not present

## 2018-11-29 DIAGNOSIS — I10 Essential (primary) hypertension: Secondary | ICD-10-CM | POA: Diagnosis not present

## 2018-12-03 DIAGNOSIS — D649 Anemia, unspecified: Secondary | ICD-10-CM | POA: Diagnosis not present

## 2018-12-03 DIAGNOSIS — R5381 Other malaise: Secondary | ICD-10-CM | POA: Diagnosis not present

## 2018-12-03 DIAGNOSIS — I10 Essential (primary) hypertension: Secondary | ICD-10-CM | POA: Diagnosis not present

## 2018-12-03 DIAGNOSIS — G4733 Obstructive sleep apnea (adult) (pediatric): Secondary | ICD-10-CM | POA: Diagnosis not present

## 2018-12-03 DIAGNOSIS — N183 Chronic kidney disease, stage 3 (moderate): Secondary | ICD-10-CM | POA: Diagnosis not present

## 2018-12-03 DIAGNOSIS — M109 Gout, unspecified: Secondary | ICD-10-CM | POA: Diagnosis not present

## 2018-12-03 DIAGNOSIS — Z0000 Encounter for general adult medical examination without abnormal findings: Secondary | ICD-10-CM | POA: Diagnosis not present

## 2018-12-03 DIAGNOSIS — E7800 Pure hypercholesterolemia, unspecified: Secondary | ICD-10-CM | POA: Diagnosis not present

## 2018-12-03 DIAGNOSIS — E1121 Type 2 diabetes mellitus with diabetic nephropathy: Secondary | ICD-10-CM | POA: Diagnosis not present

## 2018-12-18 DIAGNOSIS — N401 Enlarged prostate with lower urinary tract symptoms: Secondary | ICD-10-CM | POA: Diagnosis not present

## 2018-12-18 DIAGNOSIS — R351 Nocturia: Secondary | ICD-10-CM | POA: Diagnosis not present

## 2018-12-19 DIAGNOSIS — M1180 Other specified crystal arthropathies, unspecified site: Secondary | ICD-10-CM | POA: Diagnosis not present

## 2018-12-19 DIAGNOSIS — Z79899 Other long term (current) drug therapy: Secondary | ICD-10-CM | POA: Diagnosis not present

## 2018-12-19 DIAGNOSIS — M170 Bilateral primary osteoarthritis of knee: Secondary | ICD-10-CM | POA: Diagnosis not present

## 2018-12-19 DIAGNOSIS — M25562 Pain in left knee: Secondary | ICD-10-CM | POA: Diagnosis not present

## 2018-12-19 DIAGNOSIS — M109 Gout, unspecified: Secondary | ICD-10-CM | POA: Diagnosis not present

## 2018-12-19 DIAGNOSIS — M25461 Effusion, right knee: Secondary | ICD-10-CM | POA: Diagnosis not present

## 2018-12-19 DIAGNOSIS — M25561 Pain in right knee: Secondary | ICD-10-CM | POA: Diagnosis not present

## 2018-12-19 DIAGNOSIS — M8580 Other specified disorders of bone density and structure, unspecified site: Secondary | ICD-10-CM | POA: Diagnosis not present

## 2019-01-16 DIAGNOSIS — N401 Enlarged prostate with lower urinary tract symptoms: Secondary | ICD-10-CM | POA: Diagnosis not present

## 2019-01-16 DIAGNOSIS — R350 Frequency of micturition: Secondary | ICD-10-CM | POA: Diagnosis not present

## 2019-01-17 DIAGNOSIS — M1180 Other specified crystal arthropathies, unspecified site: Secondary | ICD-10-CM | POA: Diagnosis not present

## 2019-01-17 DIAGNOSIS — M8580 Other specified disorders of bone density and structure, unspecified site: Secondary | ICD-10-CM | POA: Diagnosis not present

## 2019-01-17 DIAGNOSIS — M109 Gout, unspecified: Secondary | ICD-10-CM | POA: Diagnosis not present

## 2019-01-17 DIAGNOSIS — Z79899 Other long term (current) drug therapy: Secondary | ICD-10-CM | POA: Diagnosis not present

## 2019-01-17 DIAGNOSIS — M25561 Pain in right knee: Secondary | ICD-10-CM | POA: Diagnosis not present

## 2019-01-17 DIAGNOSIS — M25461 Effusion, right knee: Secondary | ICD-10-CM | POA: Diagnosis not present

## 2019-01-17 DIAGNOSIS — M170 Bilateral primary osteoarthritis of knee: Secondary | ICD-10-CM | POA: Diagnosis not present

## 2019-01-17 DIAGNOSIS — M25562 Pain in left knee: Secondary | ICD-10-CM | POA: Diagnosis not present

## 2019-06-12 ENCOUNTER — Other Ambulatory Visit: Payer: Self-pay

## 2019-08-07 DIAGNOSIS — M25562 Pain in left knee: Secondary | ICD-10-CM | POA: Diagnosis not present

## 2019-08-07 DIAGNOSIS — M109 Gout, unspecified: Secondary | ICD-10-CM | POA: Diagnosis not present

## 2019-08-07 DIAGNOSIS — M25561 Pain in right knee: Secondary | ICD-10-CM | POA: Diagnosis not present

## 2019-08-07 DIAGNOSIS — M25461 Effusion, right knee: Secondary | ICD-10-CM | POA: Diagnosis not present

## 2019-08-07 DIAGNOSIS — M8580 Other specified disorders of bone density and structure, unspecified site: Secondary | ICD-10-CM | POA: Diagnosis not present

## 2019-08-07 DIAGNOSIS — Z79899 Other long term (current) drug therapy: Secondary | ICD-10-CM | POA: Diagnosis not present

## 2019-08-07 DIAGNOSIS — M1180 Other specified crystal arthropathies, unspecified site: Secondary | ICD-10-CM | POA: Diagnosis not present

## 2019-08-07 DIAGNOSIS — M170 Bilateral primary osteoarthritis of knee: Secondary | ICD-10-CM | POA: Diagnosis not present

## 2019-10-09 DIAGNOSIS — N183 Chronic kidney disease, stage 3 unspecified: Secondary | ICD-10-CM | POA: Diagnosis not present

## 2019-10-09 DIAGNOSIS — E1121 Type 2 diabetes mellitus with diabetic nephropathy: Secondary | ICD-10-CM | POA: Diagnosis not present

## 2019-10-09 DIAGNOSIS — I10 Essential (primary) hypertension: Secondary | ICD-10-CM | POA: Diagnosis not present

## 2019-10-09 DIAGNOSIS — M170 Bilateral primary osteoarthritis of knee: Secondary | ICD-10-CM | POA: Diagnosis not present

## 2019-10-09 DIAGNOSIS — M109 Gout, unspecified: Secondary | ICD-10-CM | POA: Diagnosis not present

## 2019-10-09 DIAGNOSIS — Z86711 Personal history of pulmonary embolism: Secondary | ICD-10-CM | POA: Diagnosis not present

## 2019-10-09 DIAGNOSIS — E7800 Pure hypercholesterolemia, unspecified: Secondary | ICD-10-CM | POA: Diagnosis not present

## 2019-10-09 DIAGNOSIS — Z7901 Long term (current) use of anticoagulants: Secondary | ICD-10-CM | POA: Diagnosis not present

## 2019-10-09 DIAGNOSIS — Z9989 Dependence on other enabling machines and devices: Secondary | ICD-10-CM | POA: Diagnosis not present

## 2019-10-09 DIAGNOSIS — G4733 Obstructive sleep apnea (adult) (pediatric): Secondary | ICD-10-CM | POA: Diagnosis not present

## 2020-01-21 DIAGNOSIS — N401 Enlarged prostate with lower urinary tract symptoms: Secondary | ICD-10-CM | POA: Diagnosis not present

## 2020-01-21 DIAGNOSIS — R350 Frequency of micturition: Secondary | ICD-10-CM | POA: Diagnosis not present

## 2020-03-19 DIAGNOSIS — M8580 Other specified disorders of bone density and structure, unspecified site: Secondary | ICD-10-CM | POA: Diagnosis not present

## 2020-03-19 DIAGNOSIS — M1180 Other specified crystal arthropathies, unspecified site: Secondary | ICD-10-CM | POA: Diagnosis not present

## 2020-03-19 DIAGNOSIS — M109 Gout, unspecified: Secondary | ICD-10-CM | POA: Diagnosis not present

## 2020-03-19 DIAGNOSIS — M25561 Pain in right knee: Secondary | ICD-10-CM | POA: Diagnosis not present

## 2020-03-19 DIAGNOSIS — M25562 Pain in left knee: Secondary | ICD-10-CM | POA: Diagnosis not present

## 2020-03-19 DIAGNOSIS — M170 Bilateral primary osteoarthritis of knee: Secondary | ICD-10-CM | POA: Diagnosis not present

## 2020-03-19 DIAGNOSIS — M25461 Effusion, right knee: Secondary | ICD-10-CM | POA: Diagnosis not present

## 2020-03-19 DIAGNOSIS — Z79899 Other long term (current) drug therapy: Secondary | ICD-10-CM | POA: Diagnosis not present

## 2020-03-26 DIAGNOSIS — E049 Nontoxic goiter, unspecified: Secondary | ICD-10-CM | POA: Diagnosis not present

## 2020-03-26 DIAGNOSIS — D649 Anemia, unspecified: Secondary | ICD-10-CM | POA: Diagnosis not present

## 2020-03-26 DIAGNOSIS — M170 Bilateral primary osteoarthritis of knee: Secondary | ICD-10-CM | POA: Diagnosis not present

## 2020-03-26 DIAGNOSIS — Z7901 Long term (current) use of anticoagulants: Secondary | ICD-10-CM | POA: Diagnosis not present

## 2020-03-26 DIAGNOSIS — N529 Male erectile dysfunction, unspecified: Secondary | ICD-10-CM | POA: Diagnosis not present

## 2020-03-26 DIAGNOSIS — N183 Chronic kidney disease, stage 3 unspecified: Secondary | ICD-10-CM | POA: Diagnosis not present

## 2020-03-26 DIAGNOSIS — R5381 Other malaise: Secondary | ICD-10-CM | POA: Diagnosis not present

## 2020-03-26 DIAGNOSIS — Z86711 Personal history of pulmonary embolism: Secondary | ICD-10-CM | POA: Diagnosis not present

## 2020-03-26 DIAGNOSIS — I10 Essential (primary) hypertension: Secondary | ICD-10-CM | POA: Diagnosis not present

## 2020-03-26 DIAGNOSIS — G4733 Obstructive sleep apnea (adult) (pediatric): Secondary | ICD-10-CM | POA: Diagnosis not present

## 2020-03-26 DIAGNOSIS — E1121 Type 2 diabetes mellitus with diabetic nephropathy: Secondary | ICD-10-CM | POA: Diagnosis not present

## 2020-03-26 DIAGNOSIS — M109 Gout, unspecified: Secondary | ICD-10-CM | POA: Diagnosis not present

## 2020-04-16 DIAGNOSIS — H401131 Primary open-angle glaucoma, bilateral, mild stage: Secondary | ICD-10-CM | POA: Diagnosis not present

## 2020-07-22 DIAGNOSIS — M170 Bilateral primary osteoarthritis of knee: Secondary | ICD-10-CM | POA: Diagnosis not present

## 2020-07-22 DIAGNOSIS — N1831 Chronic kidney disease, stage 3a: Secondary | ICD-10-CM | POA: Diagnosis not present

## 2020-07-22 DIAGNOSIS — M25562 Pain in left knee: Secondary | ICD-10-CM | POA: Diagnosis not present

## 2020-07-22 DIAGNOSIS — M109 Gout, unspecified: Secondary | ICD-10-CM | POA: Diagnosis not present

## 2020-07-22 DIAGNOSIS — M25561 Pain in right knee: Secondary | ICD-10-CM | POA: Diagnosis not present

## 2020-07-22 DIAGNOSIS — Z79899 Other long term (current) drug therapy: Secondary | ICD-10-CM | POA: Diagnosis not present

## 2020-07-22 DIAGNOSIS — M8580 Other specified disorders of bone density and structure, unspecified site: Secondary | ICD-10-CM | POA: Diagnosis not present

## 2020-07-22 DIAGNOSIS — M1180 Other specified crystal arthropathies, unspecified site: Secondary | ICD-10-CM | POA: Diagnosis not present

## 2020-07-22 DIAGNOSIS — M25461 Effusion, right knee: Secondary | ICD-10-CM | POA: Diagnosis not present

## 2020-09-17 DIAGNOSIS — Z23 Encounter for immunization: Secondary | ICD-10-CM | POA: Diagnosis not present

## 2020-09-17 DIAGNOSIS — I10 Essential (primary) hypertension: Secondary | ICD-10-CM | POA: Diagnosis not present

## 2020-09-24 DIAGNOSIS — M109 Gout, unspecified: Secondary | ICD-10-CM | POA: Diagnosis not present

## 2020-09-24 DIAGNOSIS — N183 Chronic kidney disease, stage 3 unspecified: Secondary | ICD-10-CM | POA: Diagnosis not present

## 2020-09-24 DIAGNOSIS — I10 Essential (primary) hypertension: Secondary | ICD-10-CM | POA: Diagnosis not present

## 2020-09-24 DIAGNOSIS — G4733 Obstructive sleep apnea (adult) (pediatric): Secondary | ICD-10-CM | POA: Diagnosis not present

## 2020-09-24 DIAGNOSIS — Z86711 Personal history of pulmonary embolism: Secondary | ICD-10-CM | POA: Diagnosis not present

## 2020-09-24 DIAGNOSIS — D649 Anemia, unspecified: Secondary | ICD-10-CM | POA: Diagnosis not present

## 2020-09-24 DIAGNOSIS — N401 Enlarged prostate with lower urinary tract symptoms: Secondary | ICD-10-CM | POA: Diagnosis not present

## 2020-09-24 DIAGNOSIS — N1832 Chronic kidney disease, stage 3b: Secondary | ICD-10-CM | POA: Diagnosis not present

## 2020-09-24 DIAGNOSIS — Z0000 Encounter for general adult medical examination without abnormal findings: Secondary | ICD-10-CM | POA: Diagnosis not present

## 2020-09-24 DIAGNOSIS — E1121 Type 2 diabetes mellitus with diabetic nephropathy: Secondary | ICD-10-CM | POA: Diagnosis not present

## 2020-09-24 DIAGNOSIS — M170 Bilateral primary osteoarthritis of knee: Secondary | ICD-10-CM | POA: Diagnosis not present

## 2020-10-16 DIAGNOSIS — Z23 Encounter for immunization: Secondary | ICD-10-CM | POA: Diagnosis not present

## 2020-12-29 DIAGNOSIS — M170 Bilateral primary osteoarthritis of knee: Secondary | ICD-10-CM | POA: Diagnosis not present

## 2020-12-29 DIAGNOSIS — N1831 Chronic kidney disease, stage 3a: Secondary | ICD-10-CM | POA: Diagnosis not present

## 2020-12-29 DIAGNOSIS — M25461 Effusion, right knee: Secondary | ICD-10-CM | POA: Diagnosis not present

## 2020-12-29 DIAGNOSIS — N1832 Chronic kidney disease, stage 3b: Secondary | ICD-10-CM | POA: Diagnosis not present

## 2020-12-29 DIAGNOSIS — M109 Gout, unspecified: Secondary | ICD-10-CM | POA: Diagnosis not present

## 2020-12-29 DIAGNOSIS — R600 Localized edema: Secondary | ICD-10-CM | POA: Diagnosis not present

## 2020-12-29 DIAGNOSIS — M25562 Pain in left knee: Secondary | ICD-10-CM | POA: Diagnosis not present

## 2020-12-29 DIAGNOSIS — Z79899 Other long term (current) drug therapy: Secondary | ICD-10-CM | POA: Diagnosis not present

## 2020-12-29 DIAGNOSIS — M25561 Pain in right knee: Secondary | ICD-10-CM | POA: Diagnosis not present

## 2020-12-29 DIAGNOSIS — M1180 Other specified crystal arthropathies, unspecified site: Secondary | ICD-10-CM | POA: Diagnosis not present

## 2020-12-29 DIAGNOSIS — N183 Chronic kidney disease, stage 3 unspecified: Secondary | ICD-10-CM | POA: Diagnosis not present

## 2020-12-29 DIAGNOSIS — M8580 Other specified disorders of bone density and structure, unspecified site: Secondary | ICD-10-CM | POA: Diagnosis not present

## 2021-07-07 DIAGNOSIS — Z23 Encounter for immunization: Secondary | ICD-10-CM | POA: Diagnosis not present

## 2021-08-05 DIAGNOSIS — M25562 Pain in left knee: Secondary | ICD-10-CM | POA: Diagnosis not present

## 2021-08-05 DIAGNOSIS — N1831 Chronic kidney disease, stage 3a: Secondary | ICD-10-CM | POA: Diagnosis not present

## 2021-08-05 DIAGNOSIS — Z79899 Other long term (current) drug therapy: Secondary | ICD-10-CM | POA: Diagnosis not present

## 2021-08-05 DIAGNOSIS — M25561 Pain in right knee: Secondary | ICD-10-CM | POA: Diagnosis not present

## 2021-08-05 DIAGNOSIS — M170 Bilateral primary osteoarthritis of knee: Secondary | ICD-10-CM | POA: Diagnosis not present

## 2021-08-05 DIAGNOSIS — M1180 Other specified crystal arthropathies, unspecified site: Secondary | ICD-10-CM | POA: Diagnosis not present

## 2021-08-05 DIAGNOSIS — M25461 Effusion, right knee: Secondary | ICD-10-CM | POA: Diagnosis not present

## 2021-08-05 DIAGNOSIS — N1832 Chronic kidney disease, stage 3b: Secondary | ICD-10-CM | POA: Diagnosis not present

## 2021-08-05 DIAGNOSIS — M109 Gout, unspecified: Secondary | ICD-10-CM | POA: Diagnosis not present

## 2021-08-05 DIAGNOSIS — M8580 Other specified disorders of bone density and structure, unspecified site: Secondary | ICD-10-CM | POA: Diagnosis not present

## 2022-01-19 DIAGNOSIS — N401 Enlarged prostate with lower urinary tract symptoms: Secondary | ICD-10-CM | POA: Diagnosis not present

## 2022-01-19 DIAGNOSIS — Z86711 Personal history of pulmonary embolism: Secondary | ICD-10-CM | POA: Diagnosis not present

## 2022-01-19 DIAGNOSIS — E039 Hypothyroidism, unspecified: Secondary | ICD-10-CM | POA: Diagnosis not present

## 2022-01-19 DIAGNOSIS — K4090 Unilateral inguinal hernia, without obstruction or gangrene, not specified as recurrent: Secondary | ICD-10-CM | POA: Diagnosis not present

## 2022-01-19 DIAGNOSIS — N1832 Chronic kidney disease, stage 3b: Secondary | ICD-10-CM | POA: Diagnosis not present

## 2022-01-19 DIAGNOSIS — E7801 Familial hypercholesterolemia: Secondary | ICD-10-CM | POA: Diagnosis not present

## 2022-01-19 DIAGNOSIS — E7800 Pure hypercholesterolemia, unspecified: Secondary | ICD-10-CM | POA: Diagnosis not present

## 2022-01-19 DIAGNOSIS — M109 Gout, unspecified: Secondary | ICD-10-CM | POA: Diagnosis not present

## 2022-01-19 DIAGNOSIS — E119 Type 2 diabetes mellitus without complications: Secondary | ICD-10-CM | POA: Diagnosis not present

## 2022-01-19 DIAGNOSIS — I10 Essential (primary) hypertension: Secondary | ICD-10-CM | POA: Diagnosis not present

## 2022-01-19 DIAGNOSIS — Z7901 Long term (current) use of anticoagulants: Secondary | ICD-10-CM | POA: Diagnosis not present

## 2022-01-19 DIAGNOSIS — Z0000 Encounter for general adult medical examination without abnormal findings: Secondary | ICD-10-CM | POA: Diagnosis not present

## 2022-02-02 DIAGNOSIS — N1831 Chronic kidney disease, stage 3a: Secondary | ICD-10-CM | POA: Diagnosis not present

## 2022-02-02 DIAGNOSIS — Z79899 Other long term (current) drug therapy: Secondary | ICD-10-CM | POA: Diagnosis not present

## 2022-02-02 DIAGNOSIS — M1180 Other specified crystal arthropathies, unspecified site: Secondary | ICD-10-CM | POA: Diagnosis not present

## 2022-02-02 DIAGNOSIS — M170 Bilateral primary osteoarthritis of knee: Secondary | ICD-10-CM | POA: Diagnosis not present

## 2022-02-02 DIAGNOSIS — M25562 Pain in left knee: Secondary | ICD-10-CM | POA: Diagnosis not present

## 2022-02-02 DIAGNOSIS — M25461 Effusion, right knee: Secondary | ICD-10-CM | POA: Diagnosis not present

## 2022-02-02 DIAGNOSIS — N1832 Chronic kidney disease, stage 3b: Secondary | ICD-10-CM | POA: Diagnosis not present

## 2022-02-02 DIAGNOSIS — M8580 Other specified disorders of bone density and structure, unspecified site: Secondary | ICD-10-CM | POA: Diagnosis not present

## 2022-02-02 DIAGNOSIS — M109 Gout, unspecified: Secondary | ICD-10-CM | POA: Diagnosis not present

## 2022-02-02 DIAGNOSIS — M25561 Pain in right knee: Secondary | ICD-10-CM | POA: Diagnosis not present

## 2022-06-14 DIAGNOSIS — R310 Gross hematuria: Secondary | ICD-10-CM | POA: Diagnosis not present

## 2022-06-14 DIAGNOSIS — R630 Anorexia: Secondary | ICD-10-CM | POA: Diagnosis not present

## 2022-06-16 DIAGNOSIS — Z86711 Personal history of pulmonary embolism: Secondary | ICD-10-CM | POA: Diagnosis not present

## 2022-06-16 DIAGNOSIS — R310 Gross hematuria: Secondary | ICD-10-CM | POA: Diagnosis not present

## 2022-06-16 DIAGNOSIS — R630 Anorexia: Secondary | ICD-10-CM | POA: Diagnosis not present

## 2022-06-17 DIAGNOSIS — K862 Cyst of pancreas: Secondary | ICD-10-CM | POA: Diagnosis not present

## 2022-06-17 DIAGNOSIS — I708 Atherosclerosis of other arteries: Secondary | ICD-10-CM | POA: Diagnosis not present

## 2022-06-17 DIAGNOSIS — R310 Gross hematuria: Secondary | ICD-10-CM | POA: Diagnosis not present

## 2022-06-17 DIAGNOSIS — N281 Cyst of kidney, acquired: Secondary | ICD-10-CM | POA: Diagnosis not present

## 2022-06-17 DIAGNOSIS — I2510 Atherosclerotic heart disease of native coronary artery without angina pectoris: Secondary | ICD-10-CM | POA: Diagnosis not present

## 2022-06-17 DIAGNOSIS — K4090 Unilateral inguinal hernia, without obstruction or gangrene, not specified as recurrent: Secondary | ICD-10-CM | POA: Diagnosis not present

## 2022-06-17 DIAGNOSIS — D1739 Benign lipomatous neoplasm of skin and subcutaneous tissue of other sites: Secondary | ICD-10-CM | POA: Diagnosis not present

## 2022-06-17 DIAGNOSIS — I700 Atherosclerosis of aorta: Secondary | ICD-10-CM | POA: Diagnosis not present

## 2022-08-10 DIAGNOSIS — M25461 Effusion, right knee: Secondary | ICD-10-CM | POA: Diagnosis not present

## 2022-08-10 DIAGNOSIS — N1832 Chronic kidney disease, stage 3b: Secondary | ICD-10-CM | POA: Diagnosis not present

## 2022-08-10 DIAGNOSIS — M170 Bilateral primary osteoarthritis of knee: Secondary | ICD-10-CM | POA: Diagnosis not present

## 2022-08-10 DIAGNOSIS — M1180 Other specified crystal arthropathies, unspecified site: Secondary | ICD-10-CM | POA: Diagnosis not present

## 2022-08-10 DIAGNOSIS — M109 Gout, unspecified: Secondary | ICD-10-CM | POA: Diagnosis not present

## 2022-08-10 DIAGNOSIS — Z79899 Other long term (current) drug therapy: Secondary | ICD-10-CM | POA: Diagnosis not present

## 2022-08-10 DIAGNOSIS — M25562 Pain in left knee: Secondary | ICD-10-CM | POA: Diagnosis not present

## 2022-08-10 DIAGNOSIS — Z23 Encounter for immunization: Secondary | ICD-10-CM | POA: Diagnosis not present

## 2022-08-10 DIAGNOSIS — N1831 Chronic kidney disease, stage 3a: Secondary | ICD-10-CM | POA: Diagnosis not present

## 2022-08-10 DIAGNOSIS — M8580 Other specified disorders of bone density and structure, unspecified site: Secondary | ICD-10-CM | POA: Diagnosis not present

## 2022-08-10 DIAGNOSIS — M25561 Pain in right knee: Secondary | ICD-10-CM | POA: Diagnosis not present

## 2022-08-17 ENCOUNTER — Encounter (HOSPITAL_COMMUNITY): Payer: Self-pay | Admitting: Emergency Medicine

## 2022-08-17 ENCOUNTER — Inpatient Hospital Stay (HOSPITAL_COMMUNITY)
Admission: EM | Admit: 2022-08-17 | Discharge: 2022-08-22 | DRG: 175 | Disposition: A | Discharge status: Hospice | Payer: Medicare Other | Attending: Internal Medicine | Admitting: Internal Medicine

## 2022-08-17 ENCOUNTER — Emergency Department (HOSPITAL_COMMUNITY): Payer: Medicare Other

## 2022-08-17 ENCOUNTER — Other Ambulatory Visit: Payer: Self-pay

## 2022-08-17 DIAGNOSIS — M545 Low back pain, unspecified: Secondary | ICD-10-CM | POA: Diagnosis present

## 2022-08-17 DIAGNOSIS — Z86718 Personal history of other venous thrombosis and embolism: Secondary | ICD-10-CM

## 2022-08-17 DIAGNOSIS — Z801 Family history of malignant neoplasm of trachea, bronchus and lung: Secondary | ICD-10-CM

## 2022-08-17 DIAGNOSIS — K769 Liver disease, unspecified: Secondary | ICD-10-CM

## 2022-08-17 DIAGNOSIS — K4090 Unilateral inguinal hernia, without obstruction or gangrene, not specified as recurrent: Secondary | ICD-10-CM | POA: Diagnosis not present

## 2022-08-17 DIAGNOSIS — J929 Pleural plaque without asbestos: Secondary | ICD-10-CM | POA: Diagnosis not present

## 2022-08-17 DIAGNOSIS — Z66 Do not resuscitate: Secondary | ICD-10-CM | POA: Diagnosis not present

## 2022-08-17 DIAGNOSIS — R634 Abnormal weight loss: Secondary | ICD-10-CM | POA: Diagnosis present

## 2022-08-17 DIAGNOSIS — I472 Ventricular tachycardia, unspecified: Secondary | ICD-10-CM | POA: Diagnosis not present

## 2022-08-17 DIAGNOSIS — I2693 Single subsegmental pulmonary embolism without acute cor pulmonale: Principal | ICD-10-CM | POA: Diagnosis present

## 2022-08-17 DIAGNOSIS — I5021 Acute systolic (congestive) heart failure: Secondary | ICD-10-CM | POA: Diagnosis not present

## 2022-08-17 DIAGNOSIS — N179 Acute kidney failure, unspecified: Secondary | ICD-10-CM | POA: Diagnosis present

## 2022-08-17 DIAGNOSIS — D696 Thrombocytopenia, unspecified: Secondary | ICD-10-CM | POA: Diagnosis present

## 2022-08-17 DIAGNOSIS — G894 Chronic pain syndrome: Secondary | ICD-10-CM | POA: Diagnosis present

## 2022-08-17 DIAGNOSIS — I429 Cardiomyopathy, unspecified: Secondary | ICD-10-CM | POA: Diagnosis present

## 2022-08-17 DIAGNOSIS — D6859 Other primary thrombophilia: Secondary | ICD-10-CM | POA: Diagnosis present

## 2022-08-17 DIAGNOSIS — M109 Gout, unspecified: Secondary | ICD-10-CM | POA: Diagnosis not present

## 2022-08-17 DIAGNOSIS — K5730 Diverticulosis of large intestine without perforation or abscess without bleeding: Secondary | ICD-10-CM | POA: Diagnosis not present

## 2022-08-17 DIAGNOSIS — I130 Hypertensive heart and chronic kidney disease with heart failure and stage 1 through stage 4 chronic kidney disease, or unspecified chronic kidney disease: Secondary | ICD-10-CM | POA: Diagnosis present

## 2022-08-17 DIAGNOSIS — Z87891 Personal history of nicotine dependence: Secondary | ICD-10-CM

## 2022-08-17 DIAGNOSIS — I2699 Other pulmonary embolism without acute cor pulmonale: Secondary | ICD-10-CM | POA: Diagnosis present

## 2022-08-17 DIAGNOSIS — I82439 Acute embolism and thrombosis of unspecified popliteal vein: Secondary | ICD-10-CM | POA: Diagnosis present

## 2022-08-17 DIAGNOSIS — D649 Anemia, unspecified: Secondary | ICD-10-CM | POA: Diagnosis present

## 2022-08-17 DIAGNOSIS — D631 Anemia in chronic kidney disease: Secondary | ICD-10-CM | POA: Diagnosis present

## 2022-08-17 DIAGNOSIS — K869 Disease of pancreas, unspecified: Secondary | ICD-10-CM | POA: Diagnosis not present

## 2022-08-17 DIAGNOSIS — I10 Essential (primary) hypertension: Secondary | ICD-10-CM | POA: Diagnosis not present

## 2022-08-17 DIAGNOSIS — I959 Hypotension, unspecified: Secondary | ICD-10-CM | POA: Diagnosis not present

## 2022-08-17 DIAGNOSIS — C787 Secondary malignant neoplasm of liver and intrahepatic bile duct: Secondary | ICD-10-CM | POA: Diagnosis not present

## 2022-08-17 DIAGNOSIS — C252 Malignant neoplasm of tail of pancreas: Secondary | ICD-10-CM | POA: Diagnosis present

## 2022-08-17 DIAGNOSIS — Z86007 Personal history of in-situ neoplasm of skin: Secondary | ICD-10-CM

## 2022-08-17 DIAGNOSIS — E441 Mild protein-calorie malnutrition: Secondary | ICD-10-CM | POA: Diagnosis not present

## 2022-08-17 DIAGNOSIS — I2510 Atherosclerotic heart disease of native coronary artery without angina pectoris: Secondary | ICD-10-CM | POA: Diagnosis not present

## 2022-08-17 DIAGNOSIS — K8689 Other specified diseases of pancreas: Secondary | ICD-10-CM | POA: Diagnosis present

## 2022-08-17 DIAGNOSIS — Z79899 Other long term (current) drug therapy: Secondary | ICD-10-CM

## 2022-08-17 DIAGNOSIS — I441 Atrioventricular block, second degree: Secondary | ICD-10-CM | POA: Diagnosis present

## 2022-08-17 DIAGNOSIS — Z6821 Body mass index (BMI) 21.0-21.9, adult: Secondary | ICD-10-CM

## 2022-08-17 DIAGNOSIS — I82442 Acute embolism and thrombosis of left tibial vein: Secondary | ICD-10-CM | POA: Diagnosis present

## 2022-08-17 DIAGNOSIS — I82432 Acute embolism and thrombosis of left popliteal vein: Secondary | ICD-10-CM | POA: Diagnosis present

## 2022-08-17 DIAGNOSIS — Z515 Encounter for palliative care: Secondary | ICD-10-CM | POA: Diagnosis not present

## 2022-08-17 DIAGNOSIS — N1831 Chronic kidney disease, stage 3a: Secondary | ICD-10-CM | POA: Diagnosis present

## 2022-08-17 DIAGNOSIS — D689 Coagulation defect, unspecified: Secondary | ICD-10-CM | POA: Diagnosis present

## 2022-08-17 DIAGNOSIS — I420 Dilated cardiomyopathy: Secondary | ICD-10-CM | POA: Diagnosis not present

## 2022-08-17 DIAGNOSIS — Z7901 Long term (current) use of anticoagulants: Secondary | ICD-10-CM

## 2022-08-17 DIAGNOSIS — K551 Chronic vascular disorders of intestine: Secondary | ICD-10-CM | POA: Diagnosis not present

## 2022-08-17 DIAGNOSIS — J9811 Atelectasis: Secondary | ICD-10-CM | POA: Diagnosis not present

## 2022-08-17 DIAGNOSIS — I700 Atherosclerosis of aorta: Secondary | ICD-10-CM | POA: Diagnosis not present

## 2022-08-17 DIAGNOSIS — N281 Cyst of kidney, acquired: Secondary | ICD-10-CM | POA: Diagnosis not present

## 2022-08-17 LAB — CBC
HCT: 33.8 % — ABNORMAL LOW (ref 39.0–52.0)
Hemoglobin: 11.5 g/dL — ABNORMAL LOW (ref 13.0–17.0)
MCH: 30.8 pg (ref 26.0–34.0)
MCHC: 34 g/dL (ref 30.0–36.0)
MCV: 90.6 fL (ref 80.0–100.0)
Platelets: 149 10*3/uL — ABNORMAL LOW (ref 150–400)
RBC: 3.73 MIL/uL — ABNORMAL LOW (ref 4.22–5.81)
RDW: 15 % (ref 11.5–15.5)
WBC: 9.2 10*3/uL (ref 4.0–10.5)
nRBC: 0 % (ref 0.0–0.2)

## 2022-08-17 LAB — COMPREHENSIVE METABOLIC PANEL
ALT: 12 U/L (ref 0–44)
AST: 18 U/L (ref 15–41)
Albumin: 3.7 g/dL (ref 3.5–5.0)
Alkaline Phosphatase: 60 U/L (ref 38–126)
Anion gap: 9 (ref 5–15)
BUN: 30 mg/dL — ABNORMAL HIGH (ref 8–23)
CO2: 27 mmol/L (ref 22–32)
Calcium: 9.5 mg/dL (ref 8.9–10.3)
Chloride: 108 mmol/L (ref 98–111)
Creatinine, Ser: 1.14 mg/dL (ref 0.61–1.24)
GFR, Estimated: 59 mL/min — ABNORMAL LOW (ref >60)
Glucose, Bld: 89 mg/dL (ref 70–99)
Potassium: 4.4 mmol/L (ref 3.5–5.1)
Sodium: 144 mmol/L (ref 135–145)
Total Bilirubin: 0.9 mg/dL (ref 0.3–1.2)
Total Protein: 6.7 g/dL (ref 6.5–8.1)

## 2022-08-17 LAB — CBG MONITORING, ED: Glucose-Capillary: 79 mg/dL (ref 70–99)

## 2022-08-17 LAB — LIPASE, BLOOD: Lipase: 56 U/L — ABNORMAL HIGH (ref 11–51)

## 2022-08-17 LAB — TROPONIN I (HIGH SENSITIVITY): Troponin I (High Sensitivity): 9 ng/L (ref <18)

## 2022-08-17 LAB — PROTIME-INR
INR: 1.1 (ref 0.8–1.2)
Prothrombin Time: 14.5 seconds (ref 11.4–15.2)

## 2022-08-17 MED ORDER — IOHEXOL 350 MG/ML SOLN
100.0000 mL | Freq: Once | INTRAVENOUS | Status: AC | PRN
  Administered 2022-08-18: 100 mL via INTRAVENOUS

## 2022-08-17 MED ORDER — FENTANYL CITRATE PF 50 MCG/ML IJ SOSY
50.0000 ug | PREFILLED_SYRINGE | Freq: Once | INTRAMUSCULAR | Status: AC
  Administered 2022-08-17: 50 ug via INTRAVENOUS
  Filled 2022-08-17: qty 1

## 2022-08-17 NOTE — ED Provider Notes (Signed)
[place] DEPT Provider Note   CSN: [id number] Arrival date & time: 08/17/22  2046     History {Add pertinent medical, surgical, social history, OB history to HPI:1} Chief Complaint  Patient presents with   Back Pain   Leg Pain    THAD OSORIA is a 88 y.o. male.  The history is provided by the patient and medical records.  Back Pain Associated symptoms: abdominal pain, chest pain and leg pain   Leg Pain Associated symptoms: back pain    88 y.o M with history of hypertension, clotting disorder, presenting to the ED with generalized pain.  Patient states he woke up this morning and was having pain in his left lower back and into the left leg.  This does extend past the knee but not all the way to the foot.  He denies any focal numbness or weakness.  He states it feels "like pain".  States afterwards started having pain moving into his abdomen and upper chest.  He denies any nausea, vomiting, or diarrhea.  He has not had any shortness of breath.  States he was breaking out into a lot of sweats and feels "nervous" as if something is just not quite right.  He is had issues like this once before without known cause.  He has no known cardiac history.  Son reports he was taken off his Coumadin recently due to hematuria.  Patient also admits to very poor appetite recently, has lost approximately 20 pounds over the past 2 months as he just does not feel hungry.  He does force himself to eat when taking his medications.  Home Medications Prior to Admission medications   Medication Sig Start Date End Date Taking? Authorizing Provider  allopurinol (ZYLOPRIM) 100 MG tablet Take 100 mg by mouth daily.      [provider]  amLODipine-valsartan (EXFORGE) 5-320 MG per tablet Take 1 tablet by mouth daily.      [provider]  levothyroxine (SYNTHROID, LEVOTHROID) 25 MCG tablet Take 25 mcg by mouth daily.      [provider]  pantoprazole  (PROTONIX) 40 MG tablet Take 40 mg by mouth daily.      [provider]  Tamsulosin HCl (FLOMAX) 0.4 MG CAPS Take by mouth daily.      [provider]  warfarin (COUMADIN) 5 MG tablet Take 5 mg by mouth daily. 1 1/2 pills mon wed, fri, 1 pill tues, thur, sat, sunday     [provider]      Allergies    Patient has no known allergies.    Review of Systems   Review of Systems  Cardiovascular:  Positive for chest pain.  Gastrointestinal:  Positive for abdominal pain.  Musculoskeletal:  Positive for back pain.  All other systems reviewed and are negative.   Physical Exam Updated Vital Signs BP 117/60 (BP Location: Right Arm)   Pulse 70   Temp 98.4 F (36.9 C) (Oral)   Resp 17   SpO2 97%   Physical Exam Vitals and nursing note reviewed.  Constitutional:      Appearance: He is well-developed.  HENT:     Head: Normocephalic and atraumatic.  Eyes:     Conjunctiva/sclera: Conjunctivae normal.     Pupils: Pupils are equal, round, and reactive to light.  Cardiovascular:     Rate and Rhythm: Normal rate and regular rhythm.     Heart sounds: Normal heart sounds.  Pulmonary:  Effort: Pulmonary effort is normal.     Breath sounds: Normal breath sounds.  Abdominal:     General: Bowel sounds are normal.     Palpations: Abdomen is soft.  Musculoskeletal:        General: Normal range of motion.     Cervical back: Normal range of motion.  Skin:    General: Skin is warm and dry.  Neurological:     Mental Status: He is alert and oriented to person, place, and time.     ED Results / Procedures / Treatments   Labs (all labs ordered are listed, but only abnormal results are displayed) Labs Reviewed  CBC  URINALYSIS, ROUTINE W REFLEX MICROSCOPIC  COMPREHENSIVE METABOLIC PANEL  LIPASE, BLOOD  PROTIME-INR  CBG MONITORING, ED  I-STAT CHEM 8, ED  TROPONIN I (HIGH SENSITIVITY)    EKG None  Radiology No results found.  Procedures Procedures   {Document cardiac monitor, telemetry assessment procedure when appropriate:1}  Medications Ordered in ED Medications  fentaNYL (SUBLIMAZE) injection 50 mcg (has no administration in time range)    ED Course/ Medical Decision Making/ A&P                           Medical Decision Making Amount and/or Complexity of Data Reviewed Labs: ordered.  Risk Prescription drug management.   ***  {Document critical care time when appropriate:1} {Document review of labs and clinical decision tools ie heart score, Chads2Vasc2 etc:1}  {Document your independent review of radiology images, and any outside records:1} {Document your discussion with family members, caretakers, and with consultants:1} {Document social determinants of health affecting pt's care:1} {Document your decision making why or why not admission, treatments were needed:1} Final Clinical Impression(s) / ED Diagnoses Final diagnoses:  None    Rx / DC Orders ED Discharge Orders     None

## 2022-08-17 NOTE — ED Provider Triage Note (Signed)
Emergency Medicine Provider Triage Evaluation Note  Peter Hall , a 88 y.o. male  was evaluated in triage.  Pt complains of left sided pain. Report developing pain to L side of back radiates to his L leg, but also affecting his chest and abdomen since this AM.  Report he feels shaky/nervous, and was sweaty.    Review of Systems  Positive: As above  Negative: As above  Physical Exam  BP 117/60 (BP Location: Right Arm)   Pulse 70   Temp 98.4 F (36.9 C) (Oral)   Resp 17   SpO2 97%  Gen:   Awake, no distress   Resp:  Normal effort  MSK:   Moves extremities without difficulty  Other:    Medical Decision Making  Medically screening exam initiated at 9:44 PM.  Appropriate orders placed.  Peter Hall was informed that the remainder of the evaluation will be completed by another provider, this initial triage assessment does not replace that evaluation, and the importance of remaining in the ED until their evaluation is complete.  May benefit from dissection study.     Domenic Moras, PA-C 08/17/22 2146

## 2022-08-17 NOTE — ED Triage Notes (Signed)
Pt presents from home with family. Pt reports left leg pain and left back pain that moves to his abdomen.  Pt states he has been "sweating" today. No chills.  Feels "nervous"

## 2022-08-17 NOTE — ED Notes (Signed)
Needs IV for CT  

## 2022-08-18 ENCOUNTER — Emergency Department (HOSPITAL_COMMUNITY): Payer: Medicare Other

## 2022-08-18 ENCOUNTER — Encounter (HOSPITAL_COMMUNITY): Payer: Self-pay

## 2022-08-18 ENCOUNTER — Observation Stay (HOSPITAL_BASED_OUTPATIENT_CLINIC_OR_DEPARTMENT_OTHER): Payer: Medicare Other

## 2022-08-18 DIAGNOSIS — D649 Anemia, unspecified: Secondary | ICD-10-CM | POA: Diagnosis not present

## 2022-08-18 DIAGNOSIS — K4090 Unilateral inguinal hernia, without obstruction or gangrene, not specified as recurrent: Secondary | ICD-10-CM | POA: Diagnosis not present

## 2022-08-18 DIAGNOSIS — I2699 Other pulmonary embolism without acute cor pulmonale: Secondary | ICD-10-CM | POA: Diagnosis not present

## 2022-08-18 DIAGNOSIS — I10 Essential (primary) hypertension: Secondary | ICD-10-CM | POA: Diagnosis present

## 2022-08-18 DIAGNOSIS — M109 Gout, unspecified: Secondary | ICD-10-CM

## 2022-08-18 DIAGNOSIS — D696 Thrombocytopenia, unspecified: Secondary | ICD-10-CM | POA: Diagnosis not present

## 2022-08-18 DIAGNOSIS — J9811 Atelectasis: Secondary | ICD-10-CM | POA: Diagnosis not present

## 2022-08-18 DIAGNOSIS — K5730 Diverticulosis of large intestine without perforation or abscess without bleeding: Secondary | ICD-10-CM | POA: Diagnosis not present

## 2022-08-18 DIAGNOSIS — N281 Cyst of kidney, acquired: Secondary | ICD-10-CM | POA: Diagnosis not present

## 2022-08-18 DIAGNOSIS — E441 Mild protein-calorie malnutrition: Secondary | ICD-10-CM | POA: Diagnosis not present

## 2022-08-18 DIAGNOSIS — I2693 Single subsegmental pulmonary embolism without acute cor pulmonale: Secondary | ICD-10-CM | POA: Diagnosis not present

## 2022-08-18 DIAGNOSIS — I2510 Atherosclerotic heart disease of native coronary artery without angina pectoris: Secondary | ICD-10-CM | POA: Diagnosis not present

## 2022-08-18 DIAGNOSIS — I82439 Acute embolism and thrombosis of unspecified popliteal vein: Secondary | ICD-10-CM | POA: Diagnosis present

## 2022-08-18 DIAGNOSIS — K8689 Other specified diseases of pancreas: Secondary | ICD-10-CM | POA: Diagnosis not present

## 2022-08-18 DIAGNOSIS — J929 Pleural plaque without asbestos: Secondary | ICD-10-CM | POA: Diagnosis not present

## 2022-08-18 DIAGNOSIS — I82432 Acute embolism and thrombosis of left popliteal vein: Secondary | ICD-10-CM | POA: Diagnosis not present

## 2022-08-18 DIAGNOSIS — K551 Chronic vascular disorders of intestine: Secondary | ICD-10-CM | POA: Diagnosis not present

## 2022-08-18 HISTORY — DX: Gout, unspecified: M10.9

## 2022-08-18 HISTORY — DX: Essential (primary) hypertension: I10

## 2022-08-18 LAB — COMPREHENSIVE METABOLIC PANEL
ALT: 12 U/L (ref 0–44)
AST: 30 U/L (ref 15–41)
Albumin: 3.4 g/dL — ABNORMAL LOW (ref 3.5–5.0)
Alkaline Phosphatase: 70 U/L (ref 38–126)
Anion gap: 8 (ref 5–15)
BUN: 25 mg/dL — ABNORMAL HIGH (ref 8–23)
CO2: 25 mmol/L (ref 22–32)
Calcium: 9.1 mg/dL (ref 8.9–10.3)
Chloride: 109 mmol/L (ref 98–111)
Creatinine, Ser: 1.04 mg/dL (ref 0.61–1.24)
GFR, Estimated: >60 mL/min (ref >60)
Glucose, Bld: 98 mg/dL (ref 70–99)
Potassium: 4.4 mmol/L (ref 3.5–5.1)
Sodium: 142 mmol/L (ref 135–145)
Total Bilirubin: 1.1 mg/dL (ref 0.3–1.2)
Total Protein: 6.3 g/dL — ABNORMAL LOW (ref 6.5–8.1)

## 2022-08-18 LAB — ECHOCARDIOGRAM COMPLETE
AR max vel: 2.06 cm2
AV Area VTI: 2.2 cm2
AV Area mean vel: 2.06 cm2
AV Mean grad: 3 mmHg
AV Peak grad: 6.6 mmHg
Ao pk vel: 1.28 m/s
Area-P 1/2: 3.27 cm2
Calc EF: 44.1 %
Height: 68 in
MV M vel: 2.43 m/s
MV Peak grad: 23.6 mmHg
S' Lateral: 2.5 cm
Single Plane A2C EF: 52.6 %
Single Plane A4C EF: 40 %
Weight: 2480 oz

## 2022-08-18 LAB — URINALYSIS, ROUTINE W REFLEX MICROSCOPIC
Bacteria, UA: NONE SEEN
Bilirubin Urine: NEGATIVE
Glucose, UA: NEGATIVE mg/dL
Hgb urine dipstick: NEGATIVE
Ketones, ur: NEGATIVE mg/dL
Leukocytes,Ua: NEGATIVE
Nitrite: NEGATIVE
Protein, ur: NEGATIVE mg/dL
Specific Gravity, Urine: 1.019 (ref 1.005–1.030)
pH: 5 (ref 5.0–8.0)

## 2022-08-18 LAB — MAGNESIUM: Magnesium: 1.6 mg/dL — ABNORMAL LOW (ref 1.7–2.4)

## 2022-08-18 LAB — HEPARIN LEVEL (UNFRACTIONATED): Heparin Unfractionated: 0.67 IU/mL (ref 0.30–0.70)

## 2022-08-18 LAB — TROPONIN I (HIGH SENSITIVITY): Troponin I (High Sensitivity): 12 ng/L (ref <18)

## 2022-08-18 LAB — LIPASE, BLOOD: Lipase: 49 U/L (ref 11–51)

## 2022-08-18 MED ORDER — IRBESARTAN 300 MG PO TABS
300.0000 mg | ORAL_TABLET | Freq: Every day | ORAL | Status: DC
  Administered 2022-08-18 – 2022-08-19 (×2): 300 mg via ORAL
  Filled 2022-08-18 (×3): qty 1

## 2022-08-18 MED ORDER — NALOXONE HCL 0.4 MG/ML IJ SOLN: 0.4000 mg | INTRAMUSCULAR | Status: DC | PRN

## 2022-08-18 MED ORDER — HEPARIN (PORCINE) 25000 UT/250ML-% IV SOLN
850.0000 [IU]/h | INTRAVENOUS | Status: DC
  Administered 2022-08-18: 1100 [IU]/h via INTRAVENOUS
  Administered 2022-08-19: 1000 [IU]/h via INTRAVENOUS
  Administered 2022-08-20 – 2022-08-21 (×2): 850 [IU]/h via INTRAVENOUS
  Filled 2022-08-18 (×6): qty 250

## 2022-08-18 MED ORDER — ACETAMINOPHEN 325 MG PO TABS
650.0000 mg | ORAL_TABLET | Freq: Four times a day (QID) | ORAL | Status: DC | PRN
  Administered 2022-08-19: 650 mg via ORAL
  Filled 2022-08-18: qty 2

## 2022-08-18 MED ORDER — AMLODIPINE BESYLATE 5 MG PO TABS
5.0000 mg | ORAL_TABLET | Freq: Every day | ORAL | Status: DC
  Administered 2022-08-18: 5 mg via ORAL
  Filled 2022-08-18: qty 1

## 2022-08-18 MED ORDER — COLCHICINE 0.6 MG PO TABS
0.6000 mg | ORAL_TABLET | ORAL | Status: DC
  Administered 2022-08-19: 0.6 mg via ORAL
  Filled 2022-08-18 (×2): qty 1

## 2022-08-18 MED ORDER — FENTANYL CITRATE PF 50 MCG/ML IJ SOSY
50.0000 ug | PREFILLED_SYRINGE | Freq: Once | INTRAMUSCULAR | Status: AC
  Administered 2022-08-18: 50 ug via INTRAVENOUS
  Filled 2022-08-18: qty 1

## 2022-08-18 MED ORDER — ONDANSETRON HCL 4 MG/2ML IJ SOLN: 4.0000 mg | Freq: Four times a day (QID) | INTRAMUSCULAR | Status: DC | PRN

## 2022-08-18 MED ORDER — ALLOPURINOL 100 MG PO TABS
100.0000 mg | ORAL_TABLET | Freq: Every day | ORAL | Status: DC
  Administered 2022-08-18 – 2022-08-22 (×5): 100 mg via ORAL
  Filled 2022-08-18 (×5): qty 1

## 2022-08-18 MED ORDER — HEPARIN BOLUS VIA INFUSION
2000.0000 [IU] | Freq: Once | INTRAVENOUS | Status: AC
  Administered 2022-08-18: 2000 [IU] via INTRAVENOUS
  Filled 2022-08-18: qty 2000

## 2022-08-18 MED ORDER — MAGNESIUM SULFATE 2 GM/50ML IV SOLN
2.0000 g | Freq: Once | INTRAVENOUS | Status: AC
  Administered 2022-08-18: 2 g via INTRAVENOUS
  Filled 2022-08-18: qty 50

## 2022-08-18 MED ORDER — AMLODIPINE BESYLATE-VALSARTAN 5-320 MG PO TABS: 1.0000 | ORAL_TABLET | Freq: Every day | ORAL | Status: DC

## 2022-08-18 MED ORDER — ACETAMINOPHEN 650 MG RE SUPP: 650.0000 mg | Freq: Four times a day (QID) | RECTAL | Status: DC | PRN

## 2022-08-18 MED ORDER — TAMSULOSIN HCL 0.4 MG PO CAPS
0.4000 mg | ORAL_CAPSULE | Freq: Every day | ORAL | Status: DC
  Administered 2022-08-18 – 2022-08-22 (×5): 0.4 mg via ORAL
  Filled 2022-08-18 (×5): qty 1

## 2022-08-18 MED ORDER — FENTANYL CITRATE PF 50 MCG/ML IJ SOSY
50.0000 ug | PREFILLED_SYRINGE | INTRAMUSCULAR | Status: DC | PRN
  Administered 2022-08-19: 50 ug via INTRAVENOUS
  Filled 2022-08-18 (×2): qty 1

## 2022-08-18 NOTE — ED Notes (Signed)
Patient has a 22G IV in his L forearm. Three ultrasound IVs  blew. Several nurses and IV team tried to place IV in patient.

## 2022-08-18 NOTE — ED Notes (Signed)
Writer received call from vascular, critical results DVT in left leg. Olevia Bowens MD notified

## 2022-08-18 NOTE — Progress Notes (Signed)
ANTICOAGULATION CONSULT NOTE  Pharmacy Consult for heparin Indication: pulmonary embolus & DVT  No Known Allergies  Patient Measurements: Height: '5\' 8"'$  (172.7 cm) Weight: 70.3 kg (155 lb) IBW/kg (Calculated) : 68.4 Heparin Dosing Weight: 70.3kg  Vital Signs: Temp: 98.1 F (36.7 C) (10/05 1500) Temp Source: Oral (10/05 1500) BP: 123/68 (10/05 1500) Pulse Rate: 66 (10/05 1500)  Labs: Recent Labs    08/17/22 2146 08/17/22 2213 08/18/22 0041 08/18/22 0500 08/18/22 1330  HGB 11.5*  --   --   --   --   HCT 33.8*  --   --   --   --   PLT 149*  --   --   --   --   LABPROT  --  14.5  --   --   --   INR  --  1.1  --   --   --   HEPARINUNFRC  --   --   --   --  0.67  CREATININE  --  1.14  --  1.04  --   TROPONINIHS  --  9 12  --   --      Estimated Creatinine Clearance: 40.2 mL/min (by C-G formula based on SCr of 1.04 mg/dL).  Assessment: 88 year old male presenting to the ED with back pain.  Woke up this morning, pain radiating down left leg.  Soon after developed chest and abdominal pain, diaphoresis, and feeling "unsettled" .  CT reveals left lobar PE with concern for heart strain.  Pt had been on coumadin but taken off for hematuria. Pt on xarelto but ran out ~ week and half ago.  Pharmacy to dose heparin drip  Baseline : INR 1.1, Hgb 11.5, plts 149  08/18/2022 First heparin level = 0.67 - therapeutic after 2000 unit bolus & drip at 1100 units/hr No bleeding reported CTA + for PE Doppler + for LLE DVT  Goal of Therapy:  Heparin level 0.3-0.7 units/ml Monitor platelets by anticoagulation protocol: Yes   Plan:  continue heparin drip at 1100 units/hr Confirmatory heparin level in 8 hours Daily CBC & heparin level F/u for transition to Novinger once able  Eudelia Bunch, Pharm.D 08/18/2022 3:32 PM

## 2022-08-18 NOTE — H&P (Signed)
History and Physical    Patient: Peter Hall HWE:[id number] DOB: Sep 16, 1924 DOA: 08/17/2022 DOS: the patient was seen and examined on 08/18/2022 PCP: Anda Kraft, MD  Patient coming from: Home  Chief Complaint:  Chief Complaint  Patient presents with   Back Pain   Leg Pain   HPI: Peter Hall is a 88 y.o. male with medical history significant of anal warts, anal carcinoma in situ, chronic pain syndrome, DDD of lumbosacral spine, back surgery, postlaminectomy syndrome, hypertension, gout, lower extremity edema, unspecified ulcer, history of hypercoagulable state previously on warfarin, then on Xarelto which was discontinued about a month ago due to hematuria who is coming to the emergency department complaints of back pain lower extremity pain that woke him up from sleep yesterday.  He has been having sweats, but no fevers or chills.  He has felt anxious.  He also stated he has lost his appetite and 20 pounds of weight over the last month. He denied fever, rhinorrhea, sore throat, wheezing or hemoptysis.  No chest pain, palpitations, PND, orthopnea or pitting edema of the lower extremities.  No abdominal pain, nausea, emesis, diarrhea, constipation, melena or hematochezia.  No flank pain, dysuria, frequency or hematuria.  He has been having nocturia 3-4 times nightly.  No polyuria, polydipsia, polyphagia or blurred vision.   ED course: Initial vital signs were temperature 98.4 F, pulse 70, respiration 17, BP 117/60 mmHg O2 sat 90% on room air. The patient received fentanyl 50 mcg IVP x2 and was started on a heparin infusion.  I added magnesium sulfate 2 g IVPB.  Lab work: His urinalysis was normal.  His CBC showed a white count 9.2, hemoglobin 11.5 g/dL platelets 149.  PT and INR were negative.  Troponin x2 normal.  Lipase was 56 and then 49 units/L.  CMP showed a BUN of 30 mg/dL, but was otherwise within normal range.  Magnesium was 1.6 mg/dL.  Imaging: CTA chest/abdomen/pelvis showed left  lower lobe pulmonary embolism with moderate clot burden and elevated RV to LV radio raising the possibility of right heart strain.  There was a 5.4 x 5.4 cm hypoenhancing mass in the pancreatic tail, poorly evaluated.  CT or MR with pancreatic protocol recommended.  CT may be better due to motion degradation issue on current study.  There was suspected hepatic lesions that are worrisome for metastases.   Review of Systems: As mentioned in the history of present illness. All other systems reviewed and are negative. Past Medical History:  Diagnosis Date   Anal warts    Carcinoma in situ of anal margin    Chills    Chronic pain syndrome 10/16/2012   Clotting disorder ([place])    DDD (degenerative disc disease), lumbosacral 04/30/2013   Essential hypertension 08/18/2022   Gout 08/18/2022   Hypertension    Leg swelling    Lumbar post-laminectomy syndrome 10/16/2012   Ulcer    Past Surgical History:  Procedure Laterality Date   ANUS SURGERY  08/25/11   fulguration anal warts    BACK SURGERY  06/23/09   HERNIA REPAIR     LAPAROSCOPIC GASTROTOMY W/ REPAIR OF ULCER     Social History:  reports that he quit smoking about 40 years ago. He has never used smokeless tobacco. He reports that he does not drink alcohol and does not use drugs.  No Known Allergies  Family History  Problem Relation Age of Onset   Cancer Father        lung  Prior to Admission medications   Medication Sig Start Date End Date Taking? Authorizing Provider  allopurinol (ZYLOPRIM) 100 MG tablet Take 100 mg by mouth daily.     Yes [provider]  amLODipine-valsartan (EXFORGE) 5-320 MG per tablet Take 1 tablet by mouth daily.     Yes [provider]  colchicine 0.6 MG tablet Take 0.6 mg by mouth every other day.   Yes [provider]  Tamsulosin HCl (FLOMAX) 0.4 MG CAPS Take 0.4 mg by mouth daily.   Yes [provider]  VITAMIN D PO Take 1 tablet by mouth daily.   Yes [provider]  warfarin (COUMADIN) 5 MG tablet Take 5 mg by mouth daily. 1 1/2 pills mon wed, fri, 1 pill tues, thur, sat, sunday    Yes [provider]  XARELTO 20 MG TABS tablet Take 20 mg by mouth daily. 07/09/22  Yes [provider]    Physical Exam: Vitals:   08/18/22 0300 08/18/22 0338 08/18/22 0600 08/18/22 0700  BP:   122/75 135/70  Pulse:   68 66  Resp:    18  Temp:  98.5 F (36.9 C)  97.9 F (36.6 C)  TempSrc:  Oral  Oral  SpO2:   98% 98%  Weight: 70.3 kg     Height: '5\' 8"'$  (1.727 m)      Physical Exam Vitals and nursing note reviewed.  Constitutional:      General: He is awake. He is not in acute distress.    Appearance: Normal appearance. He is normal weight.  HENT:     Head: Normocephalic.     Nose: No rhinorrhea.     Mouth/Throat:     Mouth: Mucous membranes are moist.  Eyes:     General: No scleral icterus.    Pupils: Pupils are equal, round, and reactive to light.  Neck:     Vascular: No JVD.  Cardiovascular:     Rate and Rhythm: Normal rate and regular rhythm.     Heart sounds: S1 normal and S2 normal.  Pulmonary:     Effort: Pulmonary effort is normal.     Breath sounds: Normal breath sounds. No wheezing, rhonchi or rales.  Abdominal:     General: Bowel sounds are normal. There is no distension.     Palpations: Abdomen is soft.     Tenderness: There is no abdominal tenderness. There is no guarding.  Musculoskeletal:     Cervical back: Neck supple.     Right lower leg: No edema.     Left lower leg: No edema.  Skin:    General: Skin is warm and dry.  Neurological:     General: No focal deficit present.     Mental Status: He is alert and oriented to person, place, and time.  Psychiatric:        Mood and Affect: Mood normal.        Behavior: Behavior normal. Behavior is cooperative.    Data Reviewed:  Results are pending, will review when available.  Assessment and Plan: Principal Problem:   Acute pulmonary embolism  ([place]) Left popliteal DVT (deep venous thrombosis) (HCC) Cardiovascularly stable. Admit to telemetry. Supplemental oxygen as needed. Continue heparin infusion. Monitor H&H and platelets closely. Echocardiogram still pending.  Active Problems:   Pancreatic mass Questionable liver mets. Received IV contrast earlier. Check renal function in AM. If GFR allows, obtain CT pancreas protocol. Pancreas MRI is also an option,  though radiology concerned about  motion degradation.    Thrombocytopenia (HCC) Monitor platelet count closely.    Normocytic anemia Monitor hematocrit and hemoglobin.    Mild protein malnutrition (HCC) Protein supplementation. Consider nutritional services evaluation.    Hypomagnesemia Replacement ordered. Follow level as needed.    Gout Continue allopurinol 100 mg p.o. daily. Continue colchicine 0.6 mg p.o. EOD.    Essential hypertension Continue amlodipine 5 mg p.o. daily. Continue valsartan 320 mg p.o. daily or formulary equivalent. Monitor BP, HR, renal function electrolytes.    Advance Care Planning:   Code Status: Full Code   Consults:   Family Communication: Spoke with his son Peter Hall about the results and treatment plan.  Severity of Illness: The appropriate patient status for this patient is OBSERVATION. Observation status is judged to be reasonable and necessary in order to provide the required intensity of service to ensure the patient's safety. The patient's presenting symptoms, physical exam findings, and initial radiographic and laboratory data in the context of their medical condition is felt to place them at decreased risk for further clinical deterioration. Furthermore, it is anticipated that the patient will be medically stable for discharge from the hospital within 2 midnights of admission.   Author: Reubin Milan, MD 08/18/2022 9:16 AM  For on call review [email or web address].   This document was prepared using Dragon  voice recognition software and may contain some unintended transcription errors.

## 2022-08-18 NOTE — Progress Notes (Signed)
  Carryover admission to the Day Admitter.  I discussed this case with the EDP, Quincy Carnes, PA.  Per these discussions:  This is a 88 year old male with history of inheritable hypercoagulable state, who was recently taken off of chronic anticoagulation via warfarin in the setting of gross materia, who is now admitted with acute left lower lobe pulmonary embolism after presenting with new onset left-sided abdominal pain radiating into the left portion of the low back.  Not associate with any overt chest pain.  CTA chest, abdomen pelvis, with dissection study is performed in the ED this evening and showed evidence of acute left lower lobe pulmonary embolism, with radiographic evidence to suggest right heart strain.  Aside from aforementioned gross hematuria, patient/his family denied any history of gastrointestinal bleed, nor any history of intracranial hemorrhage.  Started on heparin drip.  This evening CT imaging also showed evidence of new pancreatic mass, in the tail of the pancreas, as well as reportedly multiple liver lesions, concerning for new diagnosis of malignancy.  Per further discussions with the EDP this evening, family conveys that , as the patient is highly functional, in spite of his age, they like to pursue additional imaging and have the opportunity to discuss with relevant specialist, including oncology, options available for management of this new intra-abdominal finding.   I have placed an order for observation to med telemetry for further evaluation management of the above.  I have placed some additional preliminary admit orders via the adult multi-morbid admission order set.  Heparin drip continued, and I have also ordered echocardiogram for the morning to further evaluate potential right heart strain.  Also ordered prn fentanyl/Zofran, as well as morning labs to include CMP, serum magnesium level, as well as repeat lipase.    Babs Bertin, DO Hospitalist

## 2022-08-18 NOTE — Progress Notes (Signed)
  Echocardiogram 2D Echocardiogram has been performed.  Peter Hall 08/18/2022, 10:57 AM

## 2022-08-18 NOTE — Progress Notes (Signed)
ANTICOAGULATION CONSULT NOTE - Initial Consult  Pharmacy Consult for heparin Indication: pulmonary embolus  No Known Allergies  Patient Measurements: Height: '5\' 8"'$  (172.7 cm) Weight: 70.3 kg (155 lb) IBW/kg (Calculated) : 68.4 Heparin Dosing Weight: 70.3kg  Vital Signs: Temp: 98.3 F (36.8 C) (10/05 0055) Temp Source: Oral (10/05 0055) BP: 166/80 (10/05 0100) Pulse Rate: 81 (10/05 0100)  Labs: Recent Labs    08/17/22 2146 08/17/22 2213 08/18/22 0041  HGB 11.5*  --   --   HCT 33.8*  --   --   PLT 149*  --   --   LABPROT  --  14.5  --   INR  --  1.1  --   CREATININE  --  1.14  --   TROPONINIHS  --  9 12    Estimated Creatinine Clearance: 36.7 mL/min (by C-G formula based on SCr of 1.14 mg/dL).   Medical History: Past Medical History:  Diagnosis Date   Anal warts    Carcinoma in situ of anal margin    Chills    Clotting disorder (HCC)    Hypertension    Leg swelling    Ulcer      Assessment: 88 year old male presenting to the ED with back pain.  Woke up this morning, pain radiating down left leg.  Soon after developed chest and abdominal pain, diaphoresis, and feeling "unsettled" .  CT reveals left lobar PE with concern for heart strain.  Pt had been on coumadin but taken off for hematuria. Pt on xarelto but ran out ~ week and half ago.  Pharmacy to dose heparin drip  INR 1.1, Hgb 11.5, plts 149  Goal of Therapy:  Heparin level 0.3-0.7 units/ml Monitor platelets by anticoagulation protocol: Yes   Plan:  Heparin bolus 2000 units x 1 Start heparin drip at 1100 units/hr Heparin level in 8 hours Daily CBC   Dolly Rias RPh 08/18/2022, 3:16 AM

## 2022-08-18 NOTE — Progress Notes (Signed)
Patient arrived to room 421 from ED.  Assessment complete, VS obtained, and Admission database began.

## 2022-08-18 NOTE — Progress Notes (Signed)
Bilateral lower extremity venous duplex has been completed. Preliminary results can be found in CV Proc through chart review.  Results were given to the patient's nurse, Andee Poles.  08/18/22 9:22 AM Carlos Levering RVT

## 2022-08-19 DIAGNOSIS — Z515 Encounter for palliative care: Secondary | ICD-10-CM | POA: Diagnosis not present

## 2022-08-19 DIAGNOSIS — I429 Cardiomyopathy, unspecified: Secondary | ICD-10-CM | POA: Diagnosis present

## 2022-08-19 DIAGNOSIS — I82432 Acute embolism and thrombosis of left popliteal vein: Secondary | ICD-10-CM | POA: Diagnosis present

## 2022-08-19 DIAGNOSIS — N179 Acute kidney failure, unspecified: Secondary | ICD-10-CM | POA: Diagnosis present

## 2022-08-19 DIAGNOSIS — Z87891 Personal history of nicotine dependence: Secondary | ICD-10-CM | POA: Diagnosis not present

## 2022-08-19 DIAGNOSIS — I700 Atherosclerosis of aorta: Secondary | ICD-10-CM | POA: Diagnosis not present

## 2022-08-19 DIAGNOSIS — G894 Chronic pain syndrome: Secondary | ICD-10-CM | POA: Diagnosis present

## 2022-08-19 DIAGNOSIS — N281 Cyst of kidney, acquired: Secondary | ICD-10-CM | POA: Diagnosis not present

## 2022-08-19 DIAGNOSIS — I959 Hypotension, unspecified: Secondary | ICD-10-CM | POA: Diagnosis not present

## 2022-08-19 DIAGNOSIS — D689 Coagulation defect, unspecified: Secondary | ICD-10-CM | POA: Diagnosis present

## 2022-08-19 DIAGNOSIS — C252 Malignant neoplasm of tail of pancreas: Secondary | ICD-10-CM | POA: Diagnosis present

## 2022-08-19 DIAGNOSIS — I2693 Single subsegmental pulmonary embolism without acute cor pulmonale: Secondary | ICD-10-CM | POA: Diagnosis present

## 2022-08-19 DIAGNOSIS — I420 Dilated cardiomyopathy: Secondary | ICD-10-CM | POA: Diagnosis not present

## 2022-08-19 DIAGNOSIS — M545 Low back pain, unspecified: Secondary | ICD-10-CM | POA: Diagnosis present

## 2022-08-19 DIAGNOSIS — I2699 Other pulmonary embolism without acute cor pulmonale: Secondary | ICD-10-CM | POA: Diagnosis not present

## 2022-08-19 DIAGNOSIS — Z66 Do not resuscitate: Secondary | ICD-10-CM | POA: Diagnosis not present

## 2022-08-19 DIAGNOSIS — N1831 Chronic kidney disease, stage 3a: Secondary | ICD-10-CM | POA: Diagnosis present

## 2022-08-19 DIAGNOSIS — D6859 Other primary thrombophilia: Secondary | ICD-10-CM | POA: Diagnosis present

## 2022-08-19 DIAGNOSIS — C787 Secondary malignant neoplasm of liver and intrahepatic bile duct: Secondary | ICD-10-CM | POA: Diagnosis present

## 2022-08-19 DIAGNOSIS — M109 Gout, unspecified: Secondary | ICD-10-CM | POA: Diagnosis present

## 2022-08-19 DIAGNOSIS — I472 Ventricular tachycardia, unspecified: Secondary | ICD-10-CM | POA: Diagnosis not present

## 2022-08-19 DIAGNOSIS — D696 Thrombocytopenia, unspecified: Secondary | ICD-10-CM | POA: Diagnosis present

## 2022-08-19 DIAGNOSIS — D631 Anemia in chronic kidney disease: Secondary | ICD-10-CM | POA: Diagnosis present

## 2022-08-19 DIAGNOSIS — K8689 Other specified diseases of pancreas: Secondary | ICD-10-CM | POA: Diagnosis not present

## 2022-08-19 DIAGNOSIS — E441 Mild protein-calorie malnutrition: Secondary | ICD-10-CM | POA: Diagnosis present

## 2022-08-19 DIAGNOSIS — I5021 Acute systolic (congestive) heart failure: Secondary | ICD-10-CM | POA: Diagnosis present

## 2022-08-19 DIAGNOSIS — Z79899 Other long term (current) drug therapy: Secondary | ICD-10-CM | POA: Diagnosis not present

## 2022-08-19 DIAGNOSIS — I82442 Acute embolism and thrombosis of left tibial vein: Secondary | ICD-10-CM | POA: Diagnosis present

## 2022-08-19 DIAGNOSIS — I130 Hypertensive heart and chronic kidney disease with heart failure and stage 1 through stage 4 chronic kidney disease, or unspecified chronic kidney disease: Secondary | ICD-10-CM | POA: Diagnosis present

## 2022-08-19 LAB — HEPARIN LEVEL (UNFRACTIONATED)
Heparin Unfractionated: 0.47 IU/mL (ref 0.30–0.70)
Heparin Unfractionated: 0.76 IU/mL — ABNORMAL HIGH (ref 0.30–0.70)
Heparin Unfractionated: 0.9 IU/mL — ABNORMAL HIGH (ref 0.30–0.70)

## 2022-08-19 LAB — BASIC METABOLIC PANEL
Anion gap: 6 (ref 5–15)
BUN: 23 mg/dL (ref 8–23)
CO2: 27 mmol/L (ref 22–32)
Calcium: 9 mg/dL (ref 8.9–10.3)
Chloride: 106 mmol/L (ref 98–111)
Creatinine, Ser: 1.16 mg/dL (ref 0.61–1.24)
GFR, Estimated: 58 mL/min — ABNORMAL LOW (ref >60)
Glucose, Bld: 86 mg/dL (ref 70–99)
Potassium: 4.4 mmol/L (ref 3.5–5.1)
Sodium: 139 mmol/L (ref 135–145)

## 2022-08-19 LAB — CBC
HCT: 30.3 % — ABNORMAL LOW (ref 39.0–52.0)
Hemoglobin: 10.5 g/dL — ABNORMAL LOW (ref 13.0–17.0)
MCH: 30.9 pg (ref 26.0–34.0)
MCHC: 34.7 g/dL (ref 30.0–36.0)
MCV: 89.1 fL (ref 80.0–100.0)
Platelets: 137 10*3/uL — ABNORMAL LOW (ref 150–400)
RBC: 3.4 MIL/uL — ABNORMAL LOW (ref 4.22–5.81)
RDW: 14.9 % (ref 11.5–15.5)
WBC: 6.5 10*3/uL (ref 4.0–10.5)
nRBC: 0 % (ref 0.0–0.2)

## 2022-08-19 LAB — BRAIN NATRIURETIC PEPTIDE: B Natriuretic Peptide: 104.5 pg/mL — ABNORMAL HIGH (ref 0.0–100.0)

## 2022-08-19 MED ORDER — SODIUM CHLORIDE 0.9 % IV SOLN: INTRAVENOUS | Status: AC

## 2022-08-19 MED ORDER — METOPROLOL SUCCINATE ER 25 MG PO TB24
12.5000 mg | ORAL_TABLET | Freq: Every day | ORAL | Status: DC
  Filled 2022-08-19: qty 1

## 2022-08-19 NOTE — Progress Notes (Signed)
Spoke with Mia Creek, charge RN and primary RN re consult for second PIV. Educated Therapist, sports on the med/IVF compatibility, and the labs would be drawn from the opposite arm., as not to skew the lab results. RN verbalized understanding and charge RN in agreement with plan.

## 2022-08-19 NOTE — Hospital Course (Addendum)
88 year old male with history of anal warts, anal carcinoma in situ, chronic pain syndrome/DDD of lumbosacral spine/back surgery/postlaminectomy syndrome, hypertension, gout, lower extremity edema, unspecified ulcer, history of hypercoagulable state previously on warfarin, then on Xarelto which was discontinued about a month ago due to hematuria presented with back pain lower extremity pain and also sweating, feeling anxious, has lost about 20 pounds in a month.   Seen in the ED vitals were stable, labs with normal UA hemoglobin 9.5 normal total which count platelet count two-point negative x2 lipase 56 > 49, CMP fairly stable with low magnesium, CT chest abdomen pelvis done showed left lower lobe pulm embolism with moderate clot burden and CT evidence of right heart strain, 5.4X 5.4 cm hypoenhancing mass in the pancreatic tail poorly evaluated, proximal bili primary pancreatic neoplasm, suspected hepatic lesion worrisome for metastasis and patient was admitted . Managed with anticoagulation heparin, echo showed cardiomyopathy with EF 40% seen by cardiology May-Suggested.  Underwent CT [facility] that showed finding concerning for primary pancreatic neoplasm with liver metastasis, seen by oncology and after further discussion patient did not opt to go for biopsy and wants to go home with hospice.  TOC consulted.

## 2022-08-19 NOTE — Plan of Care (Signed)
  Problem: Education: Goal: Knowledge of General Education information will improve Description: Including pain rating scale, medication(s)/side effects and non-pharmacologic comfort measures Outcome: Progressing   Problem: Clinical Measurements: Goal: Ability to maintain clinical measurements within normal limits will improve Outcome: Progressing Goal: Respiratory complications will improve Outcome: Progressing Goal: Cardiovascular complication will be avoided Outcome: Progressing   Problem: Safety: Goal: Ability to remain free from injury will improve Outcome: Progressing

## 2022-08-19 NOTE — Progress Notes (Signed)
ANTICOAGULATION CONSULT NOTE   Pharmacy Consult for IV heparin Indication: PE/DVT  No Known Allergies  Patient Measurements: Height: 6' (182.9 cm) Weight: 72.3 kg (159 lb 6.3 oz) IBW/kg (Calculated) : 77.6 Heparin Dosing Weight: total body weight   Vital Signs: Temp: 98 F (36.7 C) (10/06 0518) Temp Source: Oral (10/06 0518) BP: 137/75 (10/06 0518) Pulse Rate: 59 (10/06 0518)  Labs: Recent Labs    08/17/22 2146 08/17/22 2213 08/18/22 0041 08/18/22 0500 08/18/22 1330 08/18/22 2352 08/19/22 0000 08/19/22 0537 08/19/22 0823 08/19/22 0910  HGB 11.5*  --   --   --   --   --  10.5*  --   --   --   HCT 33.8*  --   --   --   --   --  30.3*  --   --   --   PLT 149*  --   --   --   --   --  137*  --   --   --   LABPROT  --  14.5  --   --   --   --   --   --   --   --   INR  --  1.1  --   --   --   --   --   --   --   --   HEPARINUNFRC  --   --   --   --    < > 0.76*  --  THIS TEST WAS ORDERED IN ERROR AND HAS BEEN CREDITED.  --  0.90*  CREATININE  --  1.14  --  1.04  --   --   --   --  1.16  --   TROPONINIHS  --  9 12  --   --   --   --   --   --   --    < > = values in this interval not displayed.     Estimated Creatinine Clearance: 38.1 mL/min (by C-G formula based on SCr of 1.16 mg/dL).  Assessment: 88 year old male presenting to the ED with back pain. Woke up this morning, pain radiating down left leg. Soon after developed chest and abdominal pain, diaphoresis, and feeling "unsettled". CT reveals left lobar PE with concern for heart strain. Bilateral lower extremity venous duplex + for LLE DVT. Patient had been on warfarin previously, then on Xarelto which was discontinued about a month ago due to hematuria. Pharmacy consulted for IV heparin dosing.   Baseline labs: INR 1.1, Hgb 11.5, Pltc 149K  Today, 08/19/22: Heparin level = 0.9 units/mL, supratherapeutic and increased despite decrease in rate to 1000 units/hr Confirmed heparin infusing at specified rate CBC: Hgb  decreased to 10.5, Pltc decreased to 137K No bleeding or infusion issues noted per RN Confirmed heparin level collected from opposite arm from where heparin infusing  Goal of Therapy:  Heparin level 0.3-0.7 units/ml Monitor platelets by anticoagulation protocol: Yes   Plan:  Decrease heparin infusion to 850 units/hr Heparin level 8 hours after rate change Daily CBC (monitor Pltc closely), heparin level Monitor closely for s/sx of bleeding   Lindell Spar, PharmD, BCPS Clinical Pharmacist  08/19/2022, 10:44 AM

## 2022-08-19 NOTE — Consult Note (Addendum)
Cardiology Consultation   Patient ID: Peter Hall MRN: [id number]; DOB: 02-04-1924  Admit date: 08/17/2022 Date of Consult: 08/19/2022  PCP:  Anda Kraft, [place] Providers Cardiologist:  None        Patient Profile:   Peter Hall is a 88 y.o. male with a hx of anal carcinoma, chronic pain, HTN, gout, lower extremity edema, ulcer, ?clotting disorder who is being seen 08/19/2022 for the evaluation of LV dysfunction at the request of Dr. Lupita Leash.  History of Present Illness:   Peter Hall has no prior cardiac studies in our system. Per notes he has a history of hypercoagulable state previously on warfarin then Xarelto. He recalls having blood clots around the time of an ulcer diagnosis many years ago. Blood thinners were stopped a month ago due to hematuria. He presented to the ER with worsening back pain, abdominal pain, sweating, anxiousness, poor appetite, and weight loss. He'd also had some minor chest discomfort on/off for several weeks but states the back pain and abdominal pain were more significant. Workup has revealed acute LLE PE with possible right heart strain, LLE DVT, anemia (Hgb 11, plt 149), hypomagnesemia, and pancreatic mass with question of liver mets. IM team has resumed anticoagulation with heparin drip. As part of his workup he had an echo showing EF 40%, global HK, no significant valve abnormality, normal RV function. We do not have a prior to compare to. Records are otherwise limited in the chart surrounding prior diagnosis of clotting disorder. He denies any recurrent chest pain since admission. He otherwise states he feels good today. Denies orthopnea, edema, syncope, or significant SOB. BP range low 100s-140s last 24 hours. He is not hypoxic.   Past Medical History:  Diagnosis Date   Anal warts    Carcinoma in situ of anal margin    Chills    Chronic pain syndrome 10/16/2012   Clotting disorder ([place])    DDD (degenerative disc disease),  lumbosacral 04/30/2013   Essential hypertension 08/18/2022   Gout 08/18/2022   Hypertension    Leg swelling    Lumbar post-laminectomy syndrome 10/16/2012   Ulcer     Past Surgical History:  Procedure Laterality Date   ANUS SURGERY  08/25/11   fulguration anal warts    BACK SURGERY  06/23/09   HERNIA REPAIR     LAPAROSCOPIC GASTROTOMY W/ REPAIR OF ULCER       Home Medications:  Prior to Admission medications   Medication Sig Start Date End Date Taking? Authorizing Provider  allopurinol (ZYLOPRIM) 100 MG tablet Take 100 mg by mouth daily.     Yes [provider]  amLODipine-valsartan (EXFORGE) 5-320 MG per tablet Take 1 tablet by mouth daily.     Yes [provider]  colchicine 0.6 MG tablet Take 0.6 mg by mouth every other day.   Yes [provider]  Tamsulosin HCl (FLOMAX) 0.4 MG CAPS Take 0.4 mg by mouth daily.   Yes [provider]  VITAMIN D PO Take 1 tablet by mouth daily.   Yes [provider]  warfarin (COUMADIN) 5 MG tablet Take 5 mg by mouth daily. 1 1/2 pills mon wed, fri, 1 pill tues, thur, sat, sunday    Yes [provider]  XARELTO 20 MG TABS tablet Take 20 mg by mouth daily. 07/09/22  Yes [provider]    Inpatient Medications: Scheduled Meds:  allopurinol  100 mg Oral Daily   colchicine  0.6  mg Oral QODAY   irbesartan  300 mg Oral Daily   tamsulosin  0.4 mg Oral Daily   Continuous Infusions:  sodium chloride     heparin 1,000 Units/hr (08/19/22 0058)   PRN Meds: acetaminophen **OR** acetaminophen, fentaNYL (SUBLIMAZE) injection, naLOXone (NARCAN)  injection, ondansetron (ZOFRAN) IV  Allergies:   No Known Allergies  Social History:   Social History   Socioeconomic History   Marital status: Married    Spouse name: Not on file   Number of children: Not on file   Years of education: Not on file   Highest education level: Not on file  Occupational History   Not on file  Tobacco Use   Smoking  status: Former    Types: Cigarettes    Quit date: 01/01/1982    Years since quitting: 40.6   Smokeless tobacco: Never  Substance and Sexual Activity   Alcohol use: No   Drug use: No   Sexual activity: Not on file  Other Topics Concern   Not on file  Social History Narrative   Not on file   Social Determinants of Health   Financial Resource Strain: Not on file  Food Insecurity: No Food Insecurity (08/18/2022)   Hunger Vital Sign    Worried About Running Out of Food in the Last Year: Never true    Ran Out of Food in the Last Year: Never true  Transportation Needs: No Transportation Needs (08/18/2022)   PRAPARE - Hydrologist (Medical): No    Lack of Transportation (Non-Medical): No  Physical Activity: Not on file  Stress: Not on file  Social Connections: Not on file  Intimate Partner Violence: Not At Risk (08/18/2022)   Humiliation, Afraid, Rape, and Kick questionnaire    Fear of Current or Ex-Partner: No    Emotionally Abused: No    Physically Abused: No    Sexually Abused: No    Family History:    Family History  Problem Relation Age of Onset   Cancer Father        lung     ROS:  Please see the history of present illness.   All other ROS reviewed and negative.     Physical Exam/Data:   Vitals:   08/18/22 1915 08/18/22 2038 08/19/22 0021 08/19/22 0518  BP:  138/69 (!) 149/71 137/75  Pulse: 65 64 61 (!) 59  Resp:  '18 17 17  '$ Temp:  98.3 F (36.8 C) 98.4 F (36.9 C) 98 F (36.7 C)  TempSrc:  Oral Oral Oral  SpO2: 98% 100% 98% 100%  Weight:  70.7 kg  72.3 kg  Height:  6' (1.829 m)      Intake/Output Summary (Last 24 hours) at 08/19/2022 1026 Last data filed at 08/19/2022 0600 Gross per 24 hour  Intake 459.16 ml  Output 400 ml  Net 59.16 ml      08/19/2022    5:18 AM 08/18/2022    8:38 PM 08/18/2022    3:00 AM  Last 3 Weights  Weight (lbs) 159 lb 6.3 oz 155 lb 13.8 oz 155 lb  Weight (kg) 72.3 kg 70.7 kg 70.308 kg     Body  mass index is 21.62 kg/m.  General: Well developed, well nourished, in no acute distress. Head: Normocephalic, atraumatic, sclera non-icteric, no xanthomas, nares are without discharge. Neck: Negative for carotid bruits. JVP not elevated. Lungs: Clear bilaterally to auscultation without wheezes, rales, or rhonchi. Breathing is unlabored. Heart: RRR S1 S2 without  murmurs, rubs, or gallops.  Abdomen: Soft, non-tender, non-distended with normoactive bowel sounds. No rebound/guarding. Extremities: No clubbing or cyanosis. No edema. Distal pedal pulses are 2+ and equal bilaterally. Neuro: Alert and oriented X 3. Moves all extremities spontaneously. Psych:  Responds to questions appropriately with a normal affect.   EKG:  The EKG was personally reviewed and demonstrates:  NSR with first degree AVB, ILBBB nonspecific STTW changes Telemetry:  Telemetry was personally reviewed and demonstrates:  NSR HR predominantly 60s-70s, rare PVC  Relevant CV Studies: 2D echo 08/18/22   1. Left ventricular ejection fraction, by estimation, is 40%. The left  ventricle has mildly decreased function. The left ventricle demonstrates  global hypokinesis. Left ventricular diastolic parameters are  indeterminate.   2. Right ventricular systolic function is normal. The right ventricular  size is normal. Tricuspid regurgitation signal is inadequate for assessing  PA pressure.   3. The mitral valve is grossly normal. Trivial mitral valve  regurgitation. No evidence of mitral stenosis.   4. The aortic valve is abnormal. There is mild calcification of the  aortic valve. There is mild thickening of the aortic valve. Aortic valve  regurgitation is not visualized. Aortic valve sclerosis is present, with  no evidence of aortic valve stenosis.   5. The inferior vena cava is normal in size with greater than 50%  respiratory variability, suggesting right atrial pressure of 3 mmHg.    Laboratory Data:  High Sensitivity  Troponin:   Recent Labs  Lab 08/17/22 2213 08/18/22 0041  TROPONINIHS 9 12     Chemistry Recent Labs  Lab 08/17/22 2213 08/18/22 0500 08/19/22 0823  NA 144 142 139  K 4.4 4.4 4.4  CL 108 109 106  CO2 '27 25 27  '$ GLUCOSE 89 98 86  BUN 30* 25* 23  CREATININE 1.14 1.04 1.16  CALCIUM 9.5 9.1 9.0  MG  --  1.6*  --   GFRNONAA 59* >60 58*  ANIONGAP '9 8 6    '$ Recent Labs  Lab 08/17/22 2213 08/18/22 0500  PROT 6.7 6.3*  ALBUMIN 3.7 3.4*  AST 18 30  ALT 12 12  ALKPHOS 60 70  BILITOT 0.9 1.1   Lipids No results for input(s): "CHOL", "TRIG", "HDL", "LABVLDL", "LDLCALC", "CHOLHDL" in the last 168 hours.  Hematology Recent Labs  Lab 08/17/22 2146 08/19/22 0000  WBC 9.2 6.5  RBC 3.73* 3.40*  HGB 11.5* 10.5*  HCT 33.8* 30.3*  MCV 90.6 89.1  MCH 30.8 30.9  MCHC 34.0 34.7  RDW 15.0 14.9  PLT 149* 137*   Thyroid No results for input(s): "TSH", "FREET4" in the last 168 hours.  BNP Recent Labs  Lab 08/19/22 0823  BNP 104.5*    DDimer No results for input(s): "DDIMER" in the last 168 hours.   Radiology/Studies:  VAS [place] LOWER EXTREMITY VENOUS (DVT)  Result Date: 08/18/2022  Lower Venous DVT Study Patient Name:  Peter Hall  Date of Exam:   08/18/2022 Medical Rec #: [id number]       Accession #:    [id number] Date of Birth: 1924-08-03        Patient Gender: M Patient Age:   62 years Exam [facility]  [facility] Procedure:      VAS [place] LOWER EXTREMITY VENOUS (DVT) Referring Phys: DAVID ORTIZ --------------------------------------------------------------------------------  Indications: Pulmonary embolism.  Risk Factors: Confirmed PE. Comparison Study: No prior studies. Performing Technologist: Oliver Hum RVT  Examination Guidelines: A complete evaluation includes B-mode imaging, spectral Doppler, color Doppler,  and power Doppler as needed of all accessible portions of each vessel. Bilateral testing is considered an integral part of a complete examination. Limited  examinations for reoccurring indications may be performed as noted. The reflux portion of the exam is performed with the patient in reverse Trendelenburg.  +---------+---------------+---------+-----------+----------+--------------+ RIGHT    CompressibilityPhasicitySpontaneityPropertiesThrombus Aging +---------+---------------+---------+-----------+----------+--------------+ CFV      Full           Yes      Yes                                 +---------+---------------+---------+-----------+----------+--------------+ SFJ      Full                                                        +---------+---------------+---------+-----------+----------+--------------+ FV Prox  Full                                                        +---------+---------------+---------+-----------+----------+--------------+ FV Mid   Full                                                        +---------+---------------+---------+-----------+----------+--------------+ FV DistalFull                                                        +---------+---------------+---------+-----------+----------+--------------+ PFV      Full                                                        +---------+---------------+---------+-----------+----------+--------------+ POP      Full           Yes      Yes                                 +---------+---------------+---------+-----------+----------+--------------+ PTV      Full                                                        +---------+---------------+---------+-----------+----------+--------------+ PERO     Full                                                        +---------+---------------+---------+-----------+----------+--------------+   +---------+---------------+---------+-----------+----------+--------------+  LEFT     CompressibilityPhasicitySpontaneityPropertiesThrombus Aging  +---------+---------------+---------+-----------+----------+--------------+ CFV      Full           Yes      Yes                                 +---------+---------------+---------+-----------+----------+--------------+ SFJ      Full                                                        +---------+---------------+---------+-----------+----------+--------------+ FV Prox  Full                                                        +---------+---------------+---------+-----------+----------+--------------+ FV Mid                  Yes      Yes                                 +---------+---------------+---------+-----------+----------+--------------+ FV Distal               Yes      Yes                                 +---------+---------------+---------+-----------+----------+--------------+ PFV      Full                                                        +---------+---------------+---------+-----------+----------+--------------+ POP      Full           Yes      Yes                                 +---------+---------------+---------+-----------+----------+--------------+ PTV      None                                         Acute          +---------+---------------+---------+-----------+----------+--------------+ PERO     Full                                                        +---------+---------------+---------+-----------+----------+--------------+     Summary: RIGHT: - There is no evidence of deep vein thrombosis in the lower extremity.  - No cystic structure found in the popliteal fossa.  LEFT: - Findings consistent with acute deep vein thrombosis involving the left posterior tibial veins. - No cystic structure found in the popliteal fossa.  *See  table(s) above for measurements and observations. Electronically signed by Jamelle Haring on 08/18/2022 at 4:37:41 PM.    Final    ECHOCARDIOGRAM COMPLETE  Result Date: 08/18/2022    ECHOCARDIOGRAM  REPORT   Patient Name:   Peter Hall Date of Exam: 08/18/2022 Medical Rec #:  [id number]      Height:       68.0 in Accession #:    [id number]     Weight:       155.0 lb Date of Birth:  June 24, 1924       BSA:          1.834 m Patient Age:    66 years       BP:           148/79 mmHg Patient Gender: M              HR:           70 bpm. Exam Location:  Inpatient Procedure: 2D Echo Indications:    pulmonary embolus  History:        Patient has no prior history of Echocardiogram examinations.  Sonographer:    Harvie Junior Referring Phys: [id number] Rhetta Mura  Sonographer Comments: Image acquisition challenging due to respiratory motion and supine. IMPRESSIONS  1. Left ventricular ejection fraction, by estimation, is 40%. The left ventricle has mildly decreased function. The left ventricle demonstrates global hypokinesis. Left ventricular diastolic parameters are indeterminate.  2. Right ventricular systolic function is normal. The right ventricular size is normal. Tricuspid regurgitation signal is inadequate for assessing PA pressure.  3. The mitral valve is grossly normal. Trivial mitral valve regurgitation. No evidence of mitral stenosis.  4. The aortic valve is abnormal. There is mild calcification of the aortic valve. There is mild thickening of the aortic valve. Aortic valve regurgitation is not visualized. Aortic valve sclerosis is present, with no evidence of aortic valve stenosis.  5. The inferior vena cava is normal in size with greater than 50% respiratory variability, suggesting right atrial pressure of 3 mmHg. FINDINGS  Left Ventricle: Left ventricular ejection fraction, by estimation, is 40%. The left ventricle has mildly decreased function. The left ventricle demonstrates global hypokinesis. The left ventricular internal cavity size was normal in size. There is borderline left ventricular hypertrophy. Left ventricular diastolic parameters are indeterminate. Right Ventricle: The right ventricular size is  normal. Right vetricular wall thickness was not well visualized. Right ventricular systolic function is normal. Tricuspid regurgitation signal is inadequate for assessing PA pressure. The tricuspid regurgitant velocity is 1.72 m/s, and with an assumed right atrial pressure of 3 mmHg, the estimated right ventricular systolic pressure is 75.6 mmHg. Left Atrium: Left atrial size was normal in size. Right Atrium: Right atrial size was normal in size. Pericardium: There is no evidence of pericardial effusion. Mitral Valve: The mitral valve is grossly normal. Trivial mitral valve regurgitation. No evidence of mitral valve stenosis. Tricuspid Valve: The tricuspid valve is grossly normal. Tricuspid valve regurgitation is trivial. Aortic Valve: The aortic valve is abnormal. There is mild calcification of the aortic valve. There is mild thickening of the aortic valve. Aortic valve regurgitation is not visualized. Aortic valve sclerosis is present, with no evidence of aortic valve stenosis. Aortic valve mean gradient measures 3.0 mmHg. Aortic valve peak gradient measures 6.6 mmHg. Aortic valve area, by VTI measures 2.20 cm. Pulmonic Valve: The pulmonic valve was not well visualized. Pulmonic valve regurgitation is not visualized. Aorta: The aortic root is normal in  size and structure. Venous: The inferior vena cava is normal in size with greater than 50% respiratory variability, suggesting right atrial pressure of 3 mmHg. IAS/Shunts: The atrial septum is grossly normal.  LEFT VENTRICLE PLAX 2D LVIDd:         3.90 cm     Diastology LVIDs:         2.50 cm     LV e' medial:    4.03 cm/s LV PW:         1.00 cm     LV E/e' medial:  9.0 LV IVS:        1.00 cm     LV e' lateral:   6.20 cm/s LVOT diam:     2.00 cm     LV E/e' lateral: 5.9 LV SV:         55 LV SV Index:   30 LVOT Area:     3.14 cm  LV Volumes (MOD) LV vol d, MOD A2C: 73.9 ml LV vol d, MOD A4C: 53.2 ml LV vol s, MOD A2C: 35.0 ml LV vol s, MOD A4C: 31.9 ml LV SV MOD  A2C:     38.9 ml LV SV MOD A4C:     53.2 ml LV SV MOD BP:      27.3 ml RIGHT VENTRICLE RV Basal diam:  4.50 cm RV Mid diam:    3.50 cm RV S prime:     13.60 cm/s TAPSE (M-mode): 2.6 cm LEFT ATRIUM           Index        RIGHT ATRIUM          Index LA diam:      2.80 cm 1.53 cm/m   RA Area:     9.45 cm LA Vol (A2C): 26.2 ml 14.29 ml/m  RA Volume:   20.00 ml 10.91 ml/m LA Vol (A4C): 44.6 ml 24.32 ml/m  AORTIC VALVE                    PULMONIC VALVE AV Area (Vmax):    2.06 cm     PV Vmax:       0.96 m/s AV Area (Vmean):   2.06 cm     PV Peak grad:  3.7 mmHg AV Area (VTI):     2.20 cm AV Vmax:           128.00 cm/s AV Vmean:          84.200 cm/s AV VTI:            0.251 m AV Peak Grad:      6.6 mmHg AV Mean Grad:      3.0 mmHg LVOT Vmax:         84.00 cm/s LVOT Vmean:        55.300 cm/s LVOT VTI:          0.176 m LVOT/AV VTI ratio: 0.70  AORTA Ao Root diam: 3.70 cm Ao Asc diam:  3.30 cm MITRAL VALVE               TRICUSPID VALVE MV Area (PHT): 3.27 cm    TR Peak grad:   11.8 mmHg MV Decel Time: 232 msec    TR Vmax:        172.00 cm/s MR Peak grad: 23.6 mmHg MR Vmax:      242.67 cm/s  SHUNTS MV E velocity: 36.40 cm/s  Systemic VTI:  0.18 m MV A velocity: 82.70 cm/s  Systemic Diam: 2.00 cm MV E/A ratio:  0.44 Cherlynn Kaiser MD Electronically signed by Cherlynn Kaiser MD Signature Date/Time: 08/18/2022/2:37:48 PM    Final    CT Angio Chest/Abd/Pel for Dissection W and/or Wo Contrast  Result Date: 08/18/2022 CLINICAL DATA:  Left back pain radiating to abdomen EXAM: CT ANGIOGRAPHY CHEST, ABDOMEN AND PELVIS TECHNIQUE: Non-contrast CT of the chest was initially obtained. Multidetector CT imaging through the chest, abdomen and pelvis was performed using the standard protocol during bolus administration of intravenous contrast. Multiplanar reconstructed images and MIPs were obtained and reviewed to evaluate the vascular anatomy. RADIATION DOSE REDUCTION: This exam was performed according to the departmental  dose-optimization program which includes automated exposure control, adjustment of the mA and/or kV according to patient size and/or use of iterative reconstruction technique. CONTRAST:  154m OMNIPAQUE IOHEXOL 350 MG/ML SOLN COMPARISON:  CTA chest dated 10/28/2010 FINDINGS: CTA CHEST FINDINGS Cardiovascular: Unenhanced CT, there is no evidence of intrarenal hematoma. Following contrast administration, there is no evidence of thoracic aortic aneurysm or dissection. Atherosclerotic calcifications. Although not tailored for evaluation of the pulmonary arteries, there is a lobar pulmonary embolism in the left lower lobe (series 7/image 36). Clot burden is moderate. Elevated RV to LV ratio (1.1), raising the possibility of right heart strain. The heart is normal in size.  No pericardial effusion. Three vessel coronary sclerosis. Mediastinum/Nodes: No suspicious mediastinal lymphadenopathy. Enlarged thyroid with multiple dominant left thyroid nodules measuring up to 2.9 cm (series 7/image 1). These have been previously characterized on multiple thyroid ultrasounds. This has been evaluated on previous imaging. (ref: J Am Coll Radiol. 2015 Feb;12(2): 143-50). Lungs/Pleura: Mild eventration left hemidiaphragm with associated left basilar atelectasis. Additional mild dependent atelectasis in the posterior right upper and lower lobes. No focal consolidation. No suspicious pulmonary nodules. Calcified pleural plaques. No pleural effusion or pneumothorax. Musculoskeletal: Degenerative changes of the thoracic spine. Review of the MIP images confirms the above findings. CTA ABDOMEN AND PELVIS FINDINGS VASCULAR Aorta: No evidence abdominal aortic aneurysm or dissection. Patent. Atherosclerotic calcifications. Celiac: Patent.  Atherosclerotic calcifications at the origin. SMA: Patent.  Atherosclerotic calcifications at the origin. Renals: Patent. Atherosclerotic calcifications at the origin bilaterally. IMA: Patent. Inflow: Patent  bilaterally.  Atherosclerotic calcifications. Veins: Grossly unremarkable. Review of the MIP images confirms the above findings. NON-VASCULAR Motion degraded images. Hepatobiliary: Liver is poorly evaluated due to motion degradation. However, there are at least 3 hypoenhancing lesions suspected, measuring up to 1.9 cm inferiorly in segment 6 (series 7/image 93), incompletely characterized. Gallbladder is unremarkable. No intrahepatic or extrahepatic duct dilatation. Pancreas: 5.5 x 5.4 cm hypoenhancing mass in the pancreatic tail (series 7/image 78), poorly evaluated. Although benign etiologies (complex cystic lesion or pseudocyst) remain possible, a primary pancreatic neoplasm is considered the diagnosis of exclusion. Spleen: Within normal limits. Adrenals/Urinary Tract: Adrenal glands are within normal limits. Bilateral renal cysts, measuring up to 4.7 cm in the right upper kidney (series 7/image 36), measuring simple fluid density, benign (Bosniak I). No hydronephrosis. Bladder is within normal limits. Stomach/Bowel: Stomach is within normal limits. No evidence of bowel obstruction. Normal appendix (series 7/image 128). Scattered colonic diverticulosis, without evidence of diverticulitis. Lymphatic: No suspicious abdominopelvic lymphadenopathy. Reproductive: Marked prostatomegaly, suggesting BPH. Other: No abdominopelvic ascites. Tiny fat containing left inguinal hernia. Small right inguinal hernia containing loops of nondilated small bowel. Musculoskeletal: Mild degenerative changes of the lumbar spine. Review of the MIP images confirms the above findings. IMPRESSION: No evidence of thoracoabdominal aortic aneurysm or dissection. Left lower lobe lobar  pulmonary embolism. Clot burden is moderate. Elevated RV to LV ratio, raising the possibility of right heart strain. 5.4 x 5.4 cm hypoenhancing mass in the pancreatic tail, poorly evaluated. While benign etiologies remain possible, a primary pancreatic neoplasm is  considered the diagnosis of exclusion. Follow-up pancreatic protocol CT or MR (with/without contrast) can be considered for further characterization as an outpatient, if warranted at this patient's age. Given the motion degradation on the current study, CT may be better than MR. Suspected hepatic lesions, poorly visualized/evaluated. If the patient has a primary pancreatic malignancy, these would be worrisome for metastases. These could be assessed at the time of follow-up. Additional ancillary findings as above. These results were called by telephone at the time of interpretation on 08/18/2022 at 2:26 am to provider Kirstie Mirza, who verbally acknowledged these results. Electronically Signed   By: Julian Hy M.D.   On: 08/18/2022 02:27     Assessment and Plan:   1. 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 Cardiomyopathy - echocardiogram shows systolic dysfunction with EF of 40% with global HK of unclear chronicity - he has had some chest discomfort on/off in the setting of his PE but otherwise denies significant cardiac hx - given clinical picture with VTE, hematuria, advanced age, anticipate conservative management is most appropriate  - volume status looks OK - remains on irbesartan - amlodipine held in setting of PE to follow BP, will discuss additional GDMT with MD - per preliminary discussion, add very low dose BB and follow  3. Essential HTN - manage in context above  4. First degree AV block - otherwise no significant heart block seen, follow HR as outlined below  Tentatively scheduled f/u with me 11/2.   Risk Assessment/Risk Scores:     NYHA class 2-3  For questions or updates, please contact [place] Please consult  [email or web address] for contact info under    Signed, Charlie Pitter, PA-C  08/19/2022 10:26 AM As above, patient seen and examined.  Briefly he is a 88 year old male with past medical history of anal carcinoma, hypertension admitted with pulmonary embolus for evaluation of chest pain and cardiomyopathy.  Patient typically does not have dyspnea on exertion, orthopnea, PND, pedal edema or exertional chest pain.  Patient states that over the past 1 month he has lost his appetite and has had significant weight loss.  More recently he developed pain in his left leg and chest discomfort that would be intermittent and short-lived not related to activities.  He presented to the emergency room and was found to have pulmonary embolus on CT scan.  There was also note of pancreatic mass (question pancreatic cancer) and possible liver mets.  He had an echocardiogram that showed ejection fraction 40% with global hypokinesis and cardiology asked to evaluate.  Troponins normal.  BNP 104.  Creatinine 1.04.  Hemoglobin 10.5.  Electrocardiogram shows sinus rhythm with first-degree AV block with nonspecific ST changes.  Venous Doppler showed left lower extremity DVT.  1 cardiomyopathy-etiology unclear.  However given patient's age would not be aggressive with evaluation.  Continue ARB.  We will add low-dose Toprol 12.5 mg daily.  Plan will be medical therapy.  2 pulmonary embolus/DVT-patient presently on IV heparin.  Will transition to apixaban prior to discharge.  Xarelto recently discontinued due to hematuria.  We will need to follow closely for recurrences.  3 chest pain-troponins are normal.  I do not think this is ischemia related and no plans for further evaluation at this time.  Likely secondary to pulmonary embolus.  4 possible pancreatic mass with liver mets-further evaluation per primary care.  Kirk Ruths

## 2022-08-19 NOTE — Progress Notes (Signed)
ANTICOAGULATION CONSULT NOTE - Follow Up Consult  Pharmacy Consult for Heparin Indication:  DVT and PE   No Known Allergies  Patient Measurements: Height: 6' (182.9 cm) Weight: 72.3 kg (159 lb 6.3 oz) IBW/kg (Calculated) : 77.6  Vital Signs: Temp: 98.6 F (37 C) (10/06 1357) Temp Source: Oral (10/06 1357) BP: 125/70 (10/06 1357) Pulse Rate: 66 (10/06 1357)  Labs: Recent Labs    08/17/22 2146 08/17/22 2213 08/18/22 0041 08/18/22 0500 08/18/22 1330 08/19/22 0000 08/19/22 0537 08/19/22 0823 08/19/22 0910 08/19/22 1834  HGB 11.5*  --   --   --   --  10.5*  --   --   --   --   HCT 33.8*  --   --   --   --  30.3*  --   --   --   --   PLT 149*  --   --   --   --  137*  --   --   --   --   LABPROT  --  14.5  --   --   --   --   --   --   --   --   INR  --  1.1  --   --   --   --   --   --   --   --   HEPARINUNFRC  --   --   --   --    < >  --  THIS TEST WAS ORDERED IN ERROR AND HAS BEEN CREDITED.  --  0.90* 0.47  CREATININE  --  1.14  --  1.04  --   --   --  1.16  --   --   TROPONINIHS  --  9 12  --   --   --   --   --   --   --    < > = values in this interval not displayed.    Estimated Creatinine Clearance: 38.1 mL/min (by C-G formula based on SCr of 1.16 mg/dL).   Assessment: AC/Heme: history of hypercoagulable state previously on warfarin, then on Xarelto which was discontinued about a month ago due to hematuria 10/5 CTA:  LLL PE, doppler: LLE DVT 10/6: HL 0.47 now in goal range.  Goal of Therapy:  Heparin level 0.3-0.7 units/ml Monitor platelets by anticoagulation protocol: Yes   Plan:  Con't IV heparin 850 units/hr Daily HL and CBC   Jenette Rayson S. Alford Highland, PharmD, BCPS Clinical Staff Pharmacist [email or web address] Alford Highland, Talani Brazee Stillinger 08/19/2022,7:23 PM

## 2022-08-19 NOTE — TOC Progression Note (Signed)
Transition of Care [facility]) - Progression Note    Patient Details  Name: DAMMON MAKAREWICZ MRN: [id number] Date of Birth: November 22, 1923  Transition of Care [facility]) CM/SW Contact  Henrietta Dine, RN Phone Number: 08/19/2022, 3:50 PM  Clinical Narrative:     Transition of Care [facility]) Screening Note   Patient Details  Name: ATHARV BARRIERE Date of Birth: Mar 27, 1924   Transition of Care [facility]) CM/SW Contact:    Henrietta Dine, RN Phone Number: 08/19/2022, 3:50 PM    Transition of Care Department [facility]) has reviewed patient and no TOC needs have been identified at this time. We will continue to monitor patient advancement through interdisciplinary progression rounds. If new patient transition needs arise, please place a TOC consult.          Expected Discharge Plan and Services                                                 Social Determinants of Health (SDOH) Interventions    Readmission Risk Interventions     No data to display

## 2022-08-19 NOTE — Progress Notes (Signed)
[facility] for heparin Indication: pulmonary embolus & DVT  No Known Allergies  Patient Measurements: Height: 6' (182.9 cm) Weight: 70.7 kg (155 lb 13.8 oz) IBW/kg (Calculated) : 77.6 Heparin Dosing Weight: 70.3kg  Vital Signs: Temp: 98.4 F (36.9 C) (10/06 0021) Temp Source: Oral (10/06 0021) BP: 149/71 (10/06 0021) Pulse Rate: 61 (10/06 0021)  Labs: Recent Labs    08/17/22 2146 08/17/22 2213 08/18/22 0041 08/18/22 0500 08/18/22 1330 08/18/22 2352  HGB 11.5*  --   --   --   --   --   HCT 33.8*  --   --   --   --   --   PLT 149*  --   --   --   --   --   LABPROT  --  14.5  --   --   --   --   INR  --  1.1  --   --   --   --   HEPARINUNFRC  --   --   --   --  0.67 0.76*  CREATININE  --  1.14  --  1.04  --   --   TROPONINIHS  --  9 12  --   --   --      Estimated Creatinine Clearance: 41.5 mL/min (by C-G formula based on SCr of 1.04 mg/dL).  Assessment: 88 year old male presenting to the ED with back pain.  Woke up this morning, pain radiating down left leg.  Soon after developed chest and abdominal pain, diaphoresis, and feeling "unsettled" .  CT reveals left lobar PE with concern for heart strain.  Pt had been on coumadin but taken off for hematuria. Pt on xarelto but ran out ~ week and half ago.  Pharmacy to dose heparin drip  Baseline : INR 1.1, Hgb 11.5, plts 149  08/19/2022 HL 0.76 supra therapeutic on 1100 units/hr No bleeding noted per RN CTA + for PE Doppler + for LLE DVT  Goal of Therapy:  Heparin level 0.3-0.7 units/ml Monitor platelets by anticoagulation protocol: Yes   Plan:  Decrease heparin drip to 1000 units/hr Heparin level in 8 hours Daily CBC & heparin level F/u for transition to DOAC once able  Dolly Rias RPh 08/19/2022, 12:41 AM

## 2022-08-19 NOTE — Progress Notes (Signed)
PROGRESS NOTE Peter Hall  XAJ:[id number] DOB: 04-03-1924 DOA: 08/17/2022 PCP: Anda Kraft, MD   Brief Narrative/Hospital Course: 88 year old male with history of anal warts, anal carcinoma in situ, chronic pain syndrome/DDD of lumbosacral spine/back surgery/postlaminectomy syndrome, hypertension, gout, lower extremity edema, unspecified ulcer, history of hypercoagulable state previously on warfarin, then on Xarelto which was discontinued about a month ago due to hematuria presented with back pain lower extremity pain and also sweating, feeling anxious, has lost about 20 pounds in a month.   Seen in the ED vitals were stable, labs with normal UA hemoglobin 9.5 normal total which count platelet count two-point negative x2 lipase 56 > 49, CMP fairly stable with low magnesium, CT chest abdomen pelvis done showed left lower lobe pulm embolism with moderate clot burden and CT evidence of right heart strain, 5.4X 5.4 cm hypoenhancing mass in the pancreatic tail poorly evaluated, proximal bili primary pancreatic neoplasm, suspected hepatic lesion worrisome for metastasis and patient was admitted .    Subjective: Seen and examined.  Patient reports he feels much better today he is doing well on room air, no chest pain shortness of breath, no leg swelling or pain.   Assessment and Plan: Principal Problem:   Acute pulmonary embolism (HCC) Active Problems:   Essential hypertension   Gout   Normocytic anemia   Mild protein malnutrition (HCC)   Hypomagnesemia   Pancreatic mass   Thrombocytopenia (HCC)   Left popliteal DVT (deep venous thrombosis) (HCC)   Acute left lower lobe PE with right heart strain Acute left posterior tibial vein DVT Hypercoagulable state with history of being on warfarin aggressively on Xarelto: Patient having thromboembolic phenomena in the setting of discontinuation of Xarelto due to hematuria.  Continue heparin drip for now.  Echo shows EF 40%, left ventricle demonstrates  global hypokinesia, RV size normal and normal function.  Systolic dysfunction with EF 40%, unclear if new versus old no previous echo available to compare.  Check BNP.  Requesting cardiology input  History of hematuria for which he was taken off Xarelto.  Will monitor closely while resuming anticoagulation.  Pancreatic mass/ ? liver mets: We will keep him hydrated repeat BMP in the morning to concern for further ischemia  Normocytic anemia anemia of chronic disease hemoglobin 10 to 11 g range.  Monitor Thrombo-cytopenia monitor Mild protein malnutrition dietitian consulted Hypomagnesemia repleted gout continue allopurinol and colchicine Essential hypertension : BP stable I will hold off on AMLODIPINE on due to patient's systolic dysfunction  and PE. On ARBs  DVT prophylaxis:   Heparin drip Code Status:   Code Status: Full Code Family Communication: plan of care discussed with patient at bedside, called and updated his son. Patient status is: Admitted observation but remains hospitalized for IV heparin for significant bleeding  Level of care: Telemetry  Dispo: The patient is from: home            Anticipated disposition: home >2 days  Mobility Assessment (last 72 hours)     Mobility Assessment     Row Name 08/18/22 2045           Does patient have an order for bedrest or is patient medically unstable No - Continue assessment       What is the highest level of mobility based on the progressive mobility assessment? Level 3 (Stands with assist) - Balance while standing  and cannot march in place       Is the above level different from baseline mobility prior  to current illness? Yes - Recommend PT order                 Objective: Vitals last 24 hrs: Vitals:   08/18/22 1915 08/18/22 2038 08/19/22 0021 08/19/22 0518  BP:  138/69 (!) 149/71 137/75  Pulse: 65 64 61 (!) 59  Resp:  '18 17 17  '$ Temp:  98.3 F (36.8 C) 98.4 F (36.9 C) 98 F (36.7 C)  TempSrc:  Oral Oral Oral   SpO2: 98% 100% 98% 100%  Weight:  70.7 kg  72.3 kg  Height:  6' (1.829 m)     Weight change: 0.392 kg  Physical Examination: General exam: alert awake,older than stated age, weak appearing. HEENT:Oral mucosa moist, Ear/Nose WNL grossly, dentition normal. Respiratory system: bilaterally clearBS, no use of accessory muscle Cardiovascular system: S1 & S2 +, No JVD. Gastrointestinal system: Abdomen soft,NT,ND, BS+ Nervous System:Alert, awake, moving extremities and grossly nonfocal Extremities: LE edema neg,distal peripheral pulses palpable.  Skin: No rashes,no icterus. MSK: Normal muscle bulk,tone, power  Medications reviewed:  Scheduled Meds:  allopurinol  100 mg Oral Daily   colchicine  0.6 mg Oral QODAY   irbesartan  300 mg Oral Daily   tamsulosin  0.4 mg Oral Daily   Continuous Infusions:  heparin 1,000 Units/hr (08/19/22 0058)      Diet Order             Diet Heart Room service appropriate? Yes; Fluid consistency: Thin  Diet effective now                  Intake/Output Summary (Last 24 hours) at 08/19/2022 1004 Last data filed at 08/19/2022 0600 Gross per 24 hour  Intake 459.16 ml  Output 400 ml  Net 59.16 ml   Net IO Since Admission: 59.16 mL [08/19/22 1004]  Wt Readings from Last 3 Encounters:  08/19/22 72.3 kg  01/02/12 94.4 kg  09/19/11 91.3 kg     Unresulted Labs (From admission, onward)     Start     Ordered   08/19/22 0900  Heparin level (unfractionated)  ONCE - STAT,   STAT       Question:  Specimen collection method  Answer:  Lab=Lab collect   08/19/22 0857   08/19/22 0500  CBC  Daily at 5am,   R     Question:  Specimen collection method  Answer:  IV Team=IV Team collect   08/18/22 0324          Data Reviewed: I have personally reviewed following labs and imaging studies CBC: Recent Labs  Lab 08/17/22 2146 08/19/22 0000  WBC 9.2 6.5  HGB 11.5* 10.5*  HCT 33.8* 30.3*  MCV 90.6 89.1  PLT 149* 027*   Basic Metabolic  Panel: Recent Labs  Lab 08/17/22 2213 08/18/22 0500  NA 144 142  K 4.4 4.4  CL 108 109  CO2 27 25  GLUCOSE 89 98  BUN 30* 25*  CREATININE 1.14 1.04  CALCIUM 9.5 9.1  MG  --  1.6*   GFR: Estimated Creatinine Clearance: 42.5 mL/min (by C-G formula based on SCr of 1.04 mg/dL). Liver Function Tests: Recent Labs  Lab 08/17/22 2213 08/18/22 0500  AST 18 30  ALT 12 12  ALKPHOS 60 70  BILITOT 0.9 1.1  PROT 6.7 6.3*  ALBUMIN 3.7 3.4*   Recent Labs  Lab 08/17/22 2213 08/18/22 0500  LIPASE 56* 49   No results for input(s): "AMMONIA" in the last 168 hours. Coagulation Profile:  Recent Labs  Lab 08/17/22 2213  INR 1.1   BNP (last 3 results) No results for input(s): "PROBNP" in the last 8760 hours. HbA1C: No results for input(s): "HGBA1C" in the last 72 hours. CBG: Recent Labs  Lab 08/17/22 2228  GLUCAP 79   Lipid Profile: No results for input(s): "CHOL", "HDL", "LDLCALC", "TRIG", "CHOLHDL", "LDLDIRECT" in the last 72 hours. Thyroid Function Tests: No results for input(s): "TSH", "T4TOTAL", "FREET4", "T3FREE", "THYROIDAB" in the last 72 hours. Sepsis Labs: No results for input(s): "PROCALCITON", "LATICACIDVEN" in the last 168 hours.  No results found for this or any previous visit (from the past 240 hour(s)).  Antimicrobials: Anti-infectives (From admission, onward)    None      Culture/Microbiology    Component Value Date/Time   SDES URINE, CLEAN CATCH 07/21/2009 1905   SPECREQUEST NONE 07/21/2009 1905   CULT NO GROWTH 07/21/2009 1905   REPTSTATUS 07/23/2009 FINAL 07/21/2009 1905    Other culture-see note  Radiology Studies: VAS [place] LOWER EXTREMITY VENOUS (DVT)  Result Date: 08/18/2022  Lower Venous DVT Study Patient Name:  COTTRELL GENTLES  Date of Exam:   08/18/2022 Medical Rec #: [id number]       Accession #:    [id number] Date of Birth: 07-Jan-1924        Patient Gender: M Patient Age:   55 years Exam Location:  [facility] Procedure:      VAS [place]  LOWER EXTREMITY VENOUS (DVT) Referring Phys: DAVID ORTIZ --------------------------------------------------------------------------------  Indications: Pulmonary embolism.  Risk Factors: Confirmed PE. Comparison Study: No prior studies. Performing Technologist: Oliver Hum RVT  Examination Guidelines: A complete evaluation includes B-mode imaging, spectral Doppler, color Doppler, and power Doppler as needed of all accessible portions of each vessel. Bilateral testing is considered an integral part of a complete examination. Limited examinations for reoccurring indications may be performed as noted. The reflux portion of the exam is performed with the patient in reverse Trendelenburg.  +---------+---------------+---------+-----------+----------+--------------+ RIGHT    CompressibilityPhasicitySpontaneityPropertiesThrombus Aging +---------+---------------+---------+-----------+----------+--------------+ CFV      Full           Yes      Yes                                 +---------+---------------+---------+-----------+----------+--------------+ SFJ      Full                                                        +---------+---------------+---------+-----------+----------+--------------+ FV Prox  Full                                                        +---------+---------------+---------+-----------+----------+--------------+ FV Mid   Full                                                        +---------+---------------+---------+-----------+----------+--------------+ FV DistalFull                                                        +---------+---------------+---------+-----------+----------+--------------+  PFV      Full                                                        +---------+---------------+---------+-----------+----------+--------------+ POP      Full           Yes      Yes                                  +---------+---------------+---------+-----------+----------+--------------+ PTV      Full                                                        +---------+---------------+---------+-----------+----------+--------------+ PERO     Full                                                        +---------+---------------+---------+-----------+----------+--------------+   +---------+---------------+---------+-----------+----------+--------------+ LEFT     CompressibilityPhasicitySpontaneityPropertiesThrombus Aging +---------+---------------+---------+-----------+----------+--------------+ CFV      Full           Yes      Yes                                 +---------+---------------+---------+-----------+----------+--------------+ SFJ      Full                                                        +---------+---------------+---------+-----------+----------+--------------+ FV Prox  Full                                                        +---------+---------------+---------+-----------+----------+--------------+ FV Mid                  Yes      Yes                                 +---------+---------------+---------+-----------+----------+--------------+ FV Distal               Yes      Yes                                 +---------+---------------+---------+-----------+----------+--------------+ PFV      Full                                                        +---------+---------------+---------+-----------+----------+--------------+  POP      Full           Yes      Yes                                 +---------+---------------+---------+-----------+----------+--------------+ PTV      None                                         Acute          +---------+---------------+---------+-----------+----------+--------------+ PERO     Full                                                         +---------+---------------+---------+-----------+----------+--------------+     Summary: RIGHT: - There is no evidence of deep vein thrombosis in the lower extremity.  - No cystic structure found in the popliteal fossa.  LEFT: - Findings consistent with acute deep vein thrombosis involving the left posterior tibial veins. - No cystic structure found in the popliteal fossa.  *See table(s) above for measurements and observations. Electronically signed by Jamelle Haring on 08/18/2022 at 4:37:41 PM.    Final    ECHOCARDIOGRAM COMPLETE  Result Date: 08/18/2022    ECHOCARDIOGRAM REPORT   Patient Name:   SALIF TAY Date of Exam: 08/18/2022 Medical Rec #:  [id number]      Height:       68.0 in Accession #:    [id number]     Weight:       155.0 lb Date of Birth:  1924/10/04       BSA:          1.834 m Patient Age:    28 years       BP:           148/79 mmHg Patient Gender: M              HR:           70 bpm. Exam Location:  Inpatient Procedure: 2D Echo Indications:    pulmonary embolus  History:        Patient has no prior history of Echocardiogram examinations.  Sonographer:    Harvie Junior Referring Phys: [id number] Rhetta Mura  Sonographer Comments: Image acquisition challenging due to respiratory motion and supine. IMPRESSIONS  1. Left ventricular ejection fraction, by estimation, is 40%. The left ventricle has mildly decreased function. The left ventricle demonstrates global hypokinesis. Left ventricular diastolic parameters are indeterminate.  2. Right ventricular systolic function is normal. The right ventricular size is normal. Tricuspid regurgitation signal is inadequate for assessing PA pressure.  3. The mitral valve is grossly normal. Trivial mitral valve regurgitation. No evidence of mitral stenosis.  4. The aortic valve is abnormal. There is mild calcification of the aortic valve. There is mild thickening of the aortic valve. Aortic valve regurgitation is not visualized. Aortic valve sclerosis is present,  with no evidence of aortic valve stenosis.  5. The inferior vena cava is normal in size with greater than 50% respiratory variability, suggesting right atrial pressure of 3 mmHg. FINDINGS  Left Ventricle: Left ventricular ejection fraction, by estimation, is 40%. The  left ventricle has mildly decreased function. The left ventricle demonstrates global hypokinesis. The left ventricular internal cavity size was normal in size. There is borderline left ventricular hypertrophy. Left ventricular diastolic parameters are indeterminate. Right Ventricle: The right ventricular size is normal. Right vetricular wall thickness was not well visualized. Right ventricular systolic function is normal. Tricuspid regurgitation signal is inadequate for assessing PA pressure. The tricuspid regurgitant velocity is 1.72 m/s, and with an assumed right atrial pressure of 3 mmHg, the estimated right ventricular systolic pressure is 19.4 mmHg. Left Atrium: Left atrial size was normal in size. Right Atrium: Right atrial size was normal in size. Pericardium: There is no evidence of pericardial effusion. Mitral Valve: The mitral valve is grossly normal. Trivial mitral valve regurgitation. No evidence of mitral valve stenosis. Tricuspid Valve: The tricuspid valve is grossly normal. Tricuspid valve regurgitation is trivial. Aortic Valve: The aortic valve is abnormal. There is mild calcification of the aortic valve. There is mild thickening of the aortic valve. Aortic valve regurgitation is not visualized. Aortic valve sclerosis is present, with no evidence of aortic valve stenosis. Aortic valve mean gradient measures 3.0 mmHg. Aortic valve peak gradient measures 6.6 mmHg. Aortic valve area, by VTI measures 2.20 cm. Pulmonic Valve: The pulmonic valve was not well visualized. Pulmonic valve regurgitation is not visualized. Aorta: The aortic root is normal in size and structure. Venous: The inferior vena cava is normal in size with greater than 50%  respiratory variability, suggesting right atrial pressure of 3 mmHg. IAS/Shunts: The atrial septum is grossly normal.  LEFT VENTRICLE PLAX 2D LVIDd:         3.90 cm     Diastology LVIDs:         2.50 cm     LV e' medial:    4.03 cm/s LV PW:         1.00 cm     LV E/e' medial:  9.0 LV IVS:        1.00 cm     LV e' lateral:   6.20 cm/s LVOT diam:     2.00 cm     LV E/e' lateral: 5.9 LV SV:         55 LV SV Index:   30 LVOT Area:     3.14 cm  LV Volumes (MOD) LV vol d, MOD A2C: 73.9 ml LV vol d, MOD A4C: 53.2 ml LV vol s, MOD A2C: 35.0 ml LV vol s, MOD A4C: 31.9 ml LV SV MOD A2C:     38.9 ml LV SV MOD A4C:     53.2 ml LV SV MOD BP:      27.3 ml RIGHT VENTRICLE RV Basal diam:  4.50 cm RV Mid diam:    3.50 cm RV S prime:     13.60 cm/s TAPSE (M-mode): 2.6 cm LEFT ATRIUM           Index        RIGHT ATRIUM          Index LA diam:      2.80 cm 1.53 cm/m   RA Area:     9.45 cm LA Vol (A2C): 26.2 ml 14.29 ml/m  RA Volume:   20.00 ml 10.91 ml/m LA Vol (A4C): 44.6 ml 24.32 ml/m  AORTIC VALVE                    PULMONIC VALVE AV Area (Vmax):    2.06 cm     PV Vmax:  0.96 m/s AV Area (Vmean):   2.06 cm     PV Peak grad:  3.7 mmHg AV Area (VTI):     2.20 cm AV Vmax:           128.00 cm/s AV Vmean:          84.200 cm/s AV VTI:            0.251 m AV Peak Grad:      6.6 mmHg AV Mean Grad:      3.0 mmHg LVOT Vmax:         84.00 cm/s LVOT Vmean:        55.300 cm/s LVOT VTI:          0.176 m LVOT/AV VTI ratio: 0.70  AORTA Ao Root diam: 3.70 cm Ao Asc diam:  3.30 cm MITRAL VALVE               TRICUSPID VALVE MV Area (PHT): 3.27 cm    TR Peak grad:   11.8 mmHg MV Decel Time: 232 msec    TR Vmax:        172.00 cm/s MR Peak grad: 23.6 mmHg MR Vmax:      242.67 cm/s  SHUNTS MV E velocity: 36.40 cm/s  Systemic VTI:  0.18 m MV A velocity: 82.70 cm/s  Systemic Diam: 2.00 cm MV E/A ratio:  0.44 Cherlynn Kaiser MD Electronically signed by Cherlynn Kaiser MD Signature Date/Time: 08/18/2022/2:37:48 PM    Final    CT Angio  Chest/Abd/Pel for Dissection W and/or Wo Contrast  Result Date: 08/18/2022 CLINICAL DATA:  Left back pain radiating to abdomen EXAM: CT ANGIOGRAPHY CHEST, ABDOMEN AND PELVIS TECHNIQUE: Non-contrast CT of the chest was initially obtained. Multidetector CT imaging through the chest, abdomen and pelvis was performed using the standard protocol during bolus administration of intravenous contrast. Multiplanar reconstructed images and MIPs were obtained and reviewed to evaluate the vascular anatomy. RADIATION DOSE REDUCTION: This exam was performed according to the departmental dose-optimization program which includes automated exposure control, adjustment of the mA and/or kV according to patient size and/or use of iterative reconstruction technique. CONTRAST:  131m OMNIPAQUE IOHEXOL 350 MG/ML SOLN COMPARISON:  CTA chest dated 10/28/2010 FINDINGS: CTA CHEST FINDINGS Cardiovascular: Unenhanced CT, there is no evidence of intrarenal hematoma. Following contrast administration, there is no evidence of thoracic aortic aneurysm or dissection. Atherosclerotic calcifications. Although not tailored for evaluation of the pulmonary arteries, there is a lobar pulmonary embolism in the left lower lobe (series 7/image 36). Clot burden is moderate. Elevated RV to LV ratio (1.1), raising the possibility of right heart strain. The heart is normal in size.  No pericardial effusion. Three vessel coronary sclerosis. Mediastinum/Nodes: No suspicious mediastinal lymphadenopathy. Enlarged thyroid with multiple dominant left thyroid nodules measuring up to 2.9 cm (series 7/image 1). These have been previously characterized on multiple thyroid ultrasounds. This has been evaluated on previous imaging. (ref: J Am Coll Radiol. 2015 Feb;12(2): 143-50). Lungs/Pleura: Mild eventration left hemidiaphragm with associated left basilar atelectasis. Additional mild dependent atelectasis in the posterior right upper and lower lobes. No focal  consolidation. No suspicious pulmonary nodules. Calcified pleural plaques. No pleural effusion or pneumothorax. Musculoskeletal: Degenerative changes of the thoracic spine. Review of the MIP images confirms the above findings. CTA ABDOMEN AND PELVIS FINDINGS VASCULAR Aorta: No evidence abdominal aortic aneurysm or dissection. Patent. Atherosclerotic calcifications. Celiac: Patent.  Atherosclerotic calcifications at the origin. SMA: Patent.  Atherosclerotic calcifications at the origin. Renals: Patent. Atherosclerotic calcifications at the  origin bilaterally. IMA: Patent. Inflow: Patent bilaterally.  Atherosclerotic calcifications. Veins: Grossly unremarkable. Review of the MIP images confirms the above findings. NON-VASCULAR Motion degraded images. Hepatobiliary: Liver is poorly evaluated due to motion degradation. However, there are at least 3 hypoenhancing lesions suspected, measuring up to 1.9 cm inferiorly in segment 6 (series 7/image 93), incompletely characterized. Gallbladder is unremarkable. No intrahepatic or extrahepatic duct dilatation. Pancreas: 5.5 x 5.4 cm hypoenhancing mass in the pancreatic tail (series 7/image 78), poorly evaluated. Although benign etiologies (complex cystic lesion or pseudocyst) remain possible, a primary pancreatic neoplasm is considered the diagnosis of exclusion. Spleen: Within normal limits. Adrenals/Urinary Tract: Adrenal glands are within normal limits. Bilateral renal cysts, measuring up to 4.7 cm in the right upper kidney (series 7/image 36), measuring simple fluid density, benign (Bosniak I). No hydronephrosis. Bladder is within normal limits. Stomach/Bowel: Stomach is within normal limits. No evidence of bowel obstruction. Normal appendix (series 7/image 128). Scattered colonic diverticulosis, without evidence of diverticulitis. Lymphatic: No suspicious abdominopelvic lymphadenopathy. Reproductive: Marked prostatomegaly, suggesting BPH. Other: No abdominopelvic ascites.  Tiny fat containing left inguinal hernia. Small right inguinal hernia containing loops of nondilated small bowel. Musculoskeletal: Mild degenerative changes of the lumbar spine. Review of the MIP images confirms the above findings. IMPRESSION: No evidence of thoracoabdominal aortic aneurysm or dissection. Left lower lobe lobar pulmonary embolism. Clot burden is moderate. Elevated RV to LV ratio, raising the possibility of right heart strain. 5.4 x 5.4 cm hypoenhancing mass in the pancreatic tail, poorly evaluated. While benign etiologies remain possible, a primary pancreatic neoplasm is considered the diagnosis of exclusion. Follow-up pancreatic protocol CT or MR (with/without contrast) can be considered for further characterization as an outpatient, if warranted at this patient's age. Given the motion degradation on the current study, CT may be better than MR. Suspected hepatic lesions, poorly visualized/evaluated. If the patient has a primary pancreatic malignancy, these would be worrisome for metastases. These could be assessed at the time of follow-up. Additional ancillary findings as above. These results were called by telephone at the time of interpretation on 08/18/2022 at 2:26 am to provider Kirstie Mirza, who verbally acknowledged these results. Electronically Signed   By: Julian Hy M.D.   On: 08/18/2022 02:27     LOS: 0 days   Antonieta Pert, MD [facility]  08/19/2022, 10:04 AM

## 2022-08-20 ENCOUNTER — Inpatient Hospital Stay (HOSPITAL_COMMUNITY): Payer: Medicare Other

## 2022-08-20 DIAGNOSIS — I5021 Acute systolic (congestive) heart failure: Secondary | ICD-10-CM

## 2022-08-20 DIAGNOSIS — I2699 Other pulmonary embolism without acute cor pulmonale: Secondary | ICD-10-CM | POA: Diagnosis not present

## 2022-08-20 LAB — BASIC METABOLIC PANEL
Anion gap: 5 (ref 5–15)
BUN: 29 mg/dL — ABNORMAL HIGH (ref 8–23)
CO2: 26 mmol/L (ref 22–32)
Calcium: 8.3 mg/dL — ABNORMAL LOW (ref 8.9–10.3)
Chloride: 108 mmol/L (ref 98–111)
Creatinine, Ser: 1.23 mg/dL (ref 0.61–1.24)
GFR, Estimated: 54 mL/min — ABNORMAL LOW (ref >60)
Glucose, Bld: 89 mg/dL (ref 70–99)
Potassium: 4.1 mmol/L (ref 3.5–5.1)
Sodium: 139 mmol/L (ref 135–145)

## 2022-08-20 LAB — CBC
HCT: 29.1 % — ABNORMAL LOW (ref 39.0–52.0)
Hemoglobin: 10.2 g/dL — ABNORMAL LOW (ref 13.0–17.0)
MCH: 31.3 pg (ref 26.0–34.0)
MCHC: 35.1 g/dL (ref 30.0–36.0)
MCV: 89.3 fL (ref 80.0–100.0)
Platelets: 138 10*3/uL — ABNORMAL LOW (ref 150–400)
RBC: 3.26 MIL/uL — ABNORMAL LOW (ref 4.22–5.81)
RDW: 14.7 % (ref 11.5–15.5)
WBC: 7 10*3/uL (ref 4.0–10.5)
nRBC: 0 % (ref 0.0–0.2)

## 2022-08-20 LAB — HEPARIN LEVEL (UNFRACTIONATED): Heparin Unfractionated: 0.49 IU/mL (ref 0.30–0.70)

## 2022-08-20 MED ORDER — IOHEXOL 300 MG/ML  SOLN
100.0000 mL | Freq: Once | INTRAMUSCULAR | Status: AC | PRN
  Administered 2022-08-20: 100 mL via INTRAVENOUS

## 2022-08-20 MED ORDER — METOPROLOL SUCCINATE ER 25 MG PO TB24
25.0000 mg | ORAL_TABLET | Freq: Every day | ORAL | Status: DC
  Filled 2022-08-20: qty 1

## 2022-08-20 MED ORDER — SODIUM CHLORIDE (PF) 0.9 % IJ SOLN
INTRAMUSCULAR | Status: AC
  Filled 2022-08-20: qty 50

## 2022-08-20 NOTE — Progress Notes (Addendum)
Progress Note  Patient Name: Peter Hall Date of Encounter: 08/20/2022  Primary Cardiologist:   None   Subjective   Feeling well and no SOB.   Inpatient Medications    Scheduled Meds:  allopurinol  100 mg Oral Daily   colchicine  0.6 mg Oral QODAY   irbesartan  300 mg Oral Daily   metoprolol succinate  12.5 mg Oral Daily   tamsulosin  0.4 mg Oral Daily   Continuous Infusions:  heparin 850 Units/hr (08/20/22 0342)   PRN Meds: acetaminophen **OR** acetaminophen, fentaNYL (SUBLIMAZE) injection, naLOXone (NARCAN)  injection, ondansetron (ZOFRAN) IV   Vital Signs    Vitals:   08/19/22 1357 08/19/22 2103 08/20/22 0500 08/20/22 0550  BP: 125/70 135/68  (!) 142/75  Pulse: 66 69  64  Resp: 18 (!) 21  20  Temp: 98.6 F (37 C) 98.3 F (36.8 C)  98.8 F (37.1 C)  TempSrc: Oral Oral  Oral  SpO2: 99% 99%  99%  Weight:   72 kg   Height:        Intake/Output Summary (Last 24 hours) at 08/20/2022 0724 Last data filed at 08/20/2022 0700 Gross per 24 hour  Intake 704.39 ml  Output 1200 ml  Net -495.61 ml   Filed Weights   08/18/22 2038 08/19/22 0518 08/20/22 0500  Weight: 70.7 kg 72.3 kg 72 kg    Telemetry    NSR - Personally Reviewed  ECG    NA - Personally Reviewed  Physical Exam   GEN: No acute distress.   Neck: No  JVD Cardiac: RRR, soft apical systolic murmur, no diastolic murmurs, rubs, or gallops.  Respiratory:    Few basilar crackles.  GI: Soft, nontender, non-distended  MS: No  edema; No deformity. Neuro:  Nonfocal  Psych: Normal affect   Labs    Chemistry Recent Labs  Lab 08/17/22 2213 08/18/22 0500 08/19/22 0823 08/20/22 0515  NA 144 142 139 139  K 4.4 4.4 4.4 4.1  CL 108 109 106 108  CO2 '27 25 27 26  '$ GLUCOSE 89 98 86 89  BUN 30* 25* 23 29*  CREATININE 1.14 1.04 1.16 1.23  CALCIUM 9.5 9.1 9.0 8.3*  PROT 6.7 6.3*  --   --   ALBUMIN 3.7 3.4*  --   --   AST 18 30  --   --   ALT 12 12  --   --   ALKPHOS 60 70  --   --    BILITOT 0.9 1.1  --   --   GFRNONAA 59* >60 58* 54*  ANIONGAP '9 8 6 5     '$ Hematology Recent Labs  Lab 08/17/22 2146 08/19/22 0000 08/20/22 0515  WBC 9.2 6.5 7.0  RBC 3.73* 3.40* 3.26*  HGB 11.5* 10.5* 10.2*  HCT 33.8* 30.3* 29.1*  MCV 90.6 89.1 89.3  MCH 30.8 30.9 31.3  MCHC 34.0 34.7 35.1  RDW 15.0 14.9 14.7  PLT 149* 137* 138*    Cardiac EnzymesNo results for input(s): "TROPONINI" in the last 168 hours. No results for input(s): "TROPIPOC" in the last 168 hours.   BNP Recent Labs  Lab 08/19/22 0823  BNP 104.5*     DDimer No results for input(s): "DDIMER" in the last 168 hours.   Radiology    VAS [place] LOWER EXTREMITY VENOUS (DVT)  Result Date: 08/18/2022  Lower Venous DVT Study Patient Name:  CLAUDE WALDMAN  Date of Exam:   08/18/2022 Medical Rec #: [id number]  Accession #:    [id number] Date of Birth: Mar 20, 1924        Patient Gender: M Patient Age:   84 years Exam Location:  [facility] Procedure:      VAS [place] LOWER EXTREMITY VENOUS (DVT) Referring Phys: DAVID ORTIZ --------------------------------------------------------------------------------  Indications: Pulmonary embolism.  Risk Factors: Confirmed PE. Comparison Study: No prior studies. Performing Technologist: Oliver Hum RVT  Examination Guidelines: A complete evaluation includes B-mode imaging, spectral Doppler, color Doppler, and power Doppler as needed of all accessible portions of each vessel. Bilateral testing is considered an integral part of a complete examination. Limited examinations for reoccurring indications may be performed as noted. The reflux portion of the exam is performed with the patient in reverse Trendelenburg.  +---------+---------------+---------+-----------+----------+--------------+ RIGHT    CompressibilityPhasicitySpontaneityPropertiesThrombus Aging +---------+---------------+---------+-----------+----------+--------------+ CFV      Full           Yes      Yes                                  +---------+---------------+---------+-----------+----------+--------------+ SFJ      Full                                                        +---------+---------------+---------+-----------+----------+--------------+ FV Prox  Full                                                        +---------+---------------+---------+-----------+----------+--------------+ FV Mid   Full                                                        +---------+---------------+---------+-----------+----------+--------------+ FV DistalFull                                                        +---------+---------------+---------+-----------+----------+--------------+ PFV      Full                                                        +---------+---------------+---------+-----------+----------+--------------+ POP      Full           Yes      Yes                                 +---------+---------------+---------+-----------+----------+--------------+ PTV      Full                                                        +---------+---------------+---------+-----------+----------+--------------+  PERO     Full                                                        +---------+---------------+---------+-----------+----------+--------------+   +---------+---------------+---------+-----------+----------+--------------+ LEFT     CompressibilityPhasicitySpontaneityPropertiesThrombus Aging +---------+---------------+---------+-----------+----------+--------------+ CFV      Full           Yes      Yes                                 +---------+---------------+---------+-----------+----------+--------------+ SFJ      Full                                                        +---------+---------------+---------+-----------+----------+--------------+ FV Prox  Full                                                         +---------+---------------+---------+-----------+----------+--------------+ FV Mid                  Yes      Yes                                 +---------+---------------+---------+-----------+----------+--------------+ FV Distal               Yes      Yes                                 +---------+---------------+---------+-----------+----------+--------------+ PFV      Full                                                        +---------+---------------+---------+-----------+----------+--------------+ POP      Full           Yes      Yes                                 +---------+---------------+---------+-----------+----------+--------------+ PTV      None                                         Acute          +---------+---------------+---------+-----------+----------+--------------+ PERO     Full                                                        +---------+---------------+---------+-----------+----------+--------------+  Summary: RIGHT: - There is no evidence of deep vein thrombosis in the lower extremity.  - No cystic structure found in the popliteal fossa.  LEFT: - Findings consistent with acute deep vein thrombosis involving the left posterior tibial veins. - No cystic structure found in the popliteal fossa.  *See table(s) above for measurements and observations. Electronically signed by Jamelle Haring on 08/18/2022 at 4:37:41 PM.    Final    ECHOCARDIOGRAM COMPLETE  Result Date: 08/18/2022    ECHOCARDIOGRAM REPORT   Patient Name:   CORDARREL STIEFEL Date of Exam: 08/18/2022 Medical Rec #:  [id number]      Height:       68.0 in Accession #:    [id number]     Weight:       155.0 lb Date of Birth:  [birth year]       BSA:          1.834 m Patient Age:    88 years       BP:           148/79 mmHg Patient Gender: M              HR:           70 bpm. Exam Location:  Inpatient Procedure: 2D Echo Indications:    pulmonary embolus  History:        Patient has no prior  history of Echocardiogram examinations.  Sonographer:    Harvie Junior Referring Phys: [id number] Rhetta Mura  Sonographer Comments: Image acquisition challenging due to respiratory motion and supine. IMPRESSIONS  1. Left ventricular ejection fraction, by estimation, is 40%. The left ventricle has mildly decreased function. The left ventricle demonstrates global hypokinesis. Left ventricular diastolic parameters are indeterminate.  2. Right ventricular systolic function is normal. The right ventricular size is normal. Tricuspid regurgitation signal is inadequate for assessing PA pressure.  3. The mitral valve is grossly normal. Trivial mitral valve regurgitation. No evidence of mitral stenosis.  4. The aortic valve is abnormal. There is mild calcification of the aortic valve. There is mild thickening of the aortic valve. Aortic valve regurgitation is not visualized. Aortic valve sclerosis is present, with no evidence of aortic valve stenosis.  5. The inferior vena cava is normal in size with greater than 50% respiratory variability, suggesting right atrial pressure of 3 mmHg. FINDINGS  Left Ventricle: Left ventricular ejection fraction, by estimation, is 40%. The left ventricle has mildly decreased function. The left ventricle demonstrates global hypokinesis. The left ventricular internal cavity size was normal in size. There is borderline left ventricular hypertrophy. Left ventricular diastolic parameters are indeterminate. Right Ventricle: The right ventricular size is normal. Right vetricular wall thickness was not well visualized. Right ventricular systolic function is normal. Tricuspid regurgitation signal is inadequate for assessing PA pressure. The tricuspid regurgitant velocity is 1.72 m/s, and with an assumed right atrial pressure of 3 mmHg, the estimated right ventricular systolic pressure is 67.2 mmHg. Left Atrium: Left atrial size was normal in size. Right Atrium: Right atrial size was normal in size.  Pericardium: There is no evidence of pericardial effusion. Mitral Valve: The mitral valve is grossly normal. Trivial mitral valve regurgitation. No evidence of mitral valve stenosis. Tricuspid Valve: The tricuspid valve is grossly normal. Tricuspid valve regurgitation is trivial. Aortic Valve: The aortic valve is abnormal. There is mild calcification of the aortic valve. There is mild thickening of the aortic valve. Aortic valve regurgitation is not visualized. Aortic valve sclerosis is  present, with no evidence of aortic valve stenosis. Aortic valve mean gradient measures 3.0 mmHg. Aortic valve peak gradient measures 6.6 mmHg. Aortic valve area, by VTI measures 2.20 cm. Pulmonic Valve: The pulmonic valve was not well visualized. Pulmonic valve regurgitation is not visualized. Aorta: The aortic root is normal in size and structure. Venous: The inferior vena cava is normal in size with greater than 50% respiratory variability, suggesting right atrial pressure of 3 mmHg. IAS/Shunts: The atrial septum is grossly normal.  LEFT VENTRICLE PLAX 2D LVIDd:         3.90 cm     Diastology LVIDs:         2.50 cm     LV e' medial:    4.03 cm/s LV PW:         1.00 cm     LV E/e' medial:  9.0 LV IVS:        1.00 cm     LV e' lateral:   6.20 cm/s LVOT diam:     2.00 cm     LV E/e' lateral: 5.9 LV SV:         55 LV SV Index:   30 LVOT Area:     3.14 cm  LV Volumes (MOD) LV vol d, MOD A2C: 73.9 ml LV vol d, MOD A4C: 53.2 ml LV vol s, MOD A2C: 35.0 ml LV vol s, MOD A4C: 31.9 ml LV SV MOD A2C:     38.9 ml LV SV MOD A4C:     53.2 ml LV SV MOD BP:      27.3 ml RIGHT VENTRICLE RV Basal diam:  4.50 cm RV Mid diam:    3.50 cm RV S prime:     13.60 cm/s TAPSE (M-mode): 2.6 cm LEFT ATRIUM           Index        RIGHT ATRIUM          Index LA diam:      2.80 cm 1.53 cm/m   RA Area:     9.45 cm LA Vol (A2C): 26.2 ml 14.29 ml/m  RA Volume:   20.00 ml 10.91 ml/m LA Vol (A4C): 44.6 ml 24.32 ml/m  AORTIC VALVE                    PULMONIC  VALVE AV Area (Vmax):    2.06 cm     PV Vmax:       0.96 m/s AV Area (Vmean):   2.06 cm     PV Peak grad:  3.7 mmHg AV Area (VTI):     2.20 cm AV Vmax:           128.00 cm/s AV Vmean:          84.200 cm/s AV VTI:            0.251 m AV Peak Grad:      6.6 mmHg AV Mean Grad:      3.0 mmHg LVOT Vmax:         84.00 cm/s LVOT Vmean:        55.300 cm/s LVOT VTI:          0.176 m LVOT/AV VTI ratio: 0.70  AORTA Ao Root diam: 3.70 cm Ao Asc diam:  3.30 cm MITRAL VALVE               TRICUSPID VALVE MV Area (PHT): 3.27 cm    TR Peak grad:   11.8  mmHg MV Decel Time: 232 msec    TR Vmax:        172.00 cm/s MR Peak grad: 23.6 mmHg MR Vmax:      242.67 cm/s  SHUNTS MV E velocity: 36.40 cm/s  Systemic VTI:  0.18 m MV A velocity: 82.70 cm/s  Systemic Diam: 2.00 cm MV E/A ratio:  0.44 Cherlynn Kaiser MD Electronically signed by Cherlynn Kaiser MD Signature Date/Time: 08/18/2022/2:37:48 PM    Final     Cardiac Studies   ECHO   1. Left ventricular ejection fraction, by estimation, is 40%. The left  ventricle has mildly decreased function. The left ventricle demonstrates  global hypokinesis. Left ventricular diastolic parameters are  indeterminate.   2. Right ventricular systolic function is normal. The right ventricular  size is normal. Tricuspid regurgitation signal is inadequate for assessing  PA pressure.   3. The mitral valve is grossly normal. Trivial mitral valve  regurgitation. No evidence of mitral stenosis.   4. The aortic valve is abnormal. There is mild calcification of the  aortic valve. There is mild thickening of the aortic valve. Aortic valve  regurgitation is not visualized. Aortic valve sclerosis is present, with  no evidence of aortic valve stenosis.   5. The inferior vena cava is normal in size with greater than 50%  respiratory variability, suggesting right atrial pressure of 3 mmHg.   Patient Profile     88 y.o. male with a hx of anal carcinoma, chronic pain, HTN, gout, lower extremity  edema, ulcer, ?clotting disorder who is being seen 08/19/2022 for the evaluation of LV dysfunction at the request of Dr. Lupita Leash.    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   Resume DOAC per primary team.    Cardiomyopathy:  EF is 40%.  Intake and out put is incomplete.  Started on low dose beta blocker.  We will increase to 25 mg daily.  Norvasc is off.  Continue ARB.  Medical therapy only.   Follow up is scheduled for 11/2 in our office with Melina Copa PAC.     Essential HTN:  This is being managed in the context of treating his CHF   First degree AV block:  No significant arrhythmias.     For questions or updates, please contact [place] Please consult [email or web address] for contact info under Cardiology/STEMI.   Signed, Minus Breeding, MD  08/20/2022, 7:24 AM

## 2022-08-20 NOTE — Progress Notes (Signed)
Peter Hall  GDJ:[id number] DOB: [birth year] DOA: 08/17/2022 PCP: Anda Kraft, MD   Brief Narrative/Hospital Course: 88 year old male with history of anal warts, anal carcinoma in situ, chronic pain syndrome/DDD of lumbosacral spine/back surgery/postlaminectomy syndrome, hypertension, gout, lower extremity edema, unspecified ulcer, history of hypercoagulable state previously on warfarin, then on Xarelto which was discontinued about a month ago due to hematuria presented with back pain lower extremity pain and also sweating, feeling anxious, has lost about 20 pounds in a month.   Seen in the ED vitals were stable, labs with normal UA hemoglobin 9.5 normal total which count platelet count two-point negative x2 lipase 56 > 49, CMP fairly stable with low magnesium, CT chest abdomen pelvis done showed left lower lobe pulm embolism with moderate clot burden and CT evidence of right heart strain, 5.4X 5.4 cm hypoenhancing mass in the pancreatic tail poorly evaluated, proximal bili primary pancreatic neoplasm, suspected hepatic lesion worrisome for metastasis and patient was admitted .   Subjective: Seen this am Some rt side pain last night No chest pain nausea or vomiting Overnight patient has been afebrile Hemoglobin creatinine stable   Assessment and Plan: Principal Problem:   Acute pulmonary embolism (HCC) Active Problems:   Essential hypertension   Gout   Normocytic anemia   Mild protein malnutrition (HCC)   Hypomagnesemia   Pancreatic mass   Thrombocytopenia (HCC)   Left popliteal DVT (deep venous thrombosis) (HCC)   Acute systolic HF (heart failure) (HCC)   Acute left lower lobe PE with right heart strain Acute left posterior tibial vein DVT Hypercoagulable state with history of being on warfarin and switched to Xarelto: Patient having thromboembolic phenomena in the setting of discontinuation of Xarelto due to hematuria.  Being managed with heparin drip monitor for  hematuria.  Echo done with EF 50% RV size normal with normal function.  Hopefully can transition to Eliquis tomorrow.  Cardiomyopathy/acute systolic dysfunction with EF 40%, cardiology following starting low-dose beta-blocker, amlodipine discontinued, hold ARB, currently compensated at this time plan for outpatient follow-up.  BP being soft this morning holding ARB and metoprolol RN to notify cardiology Essential hypertension : BP control now off amlodipine continue ARB and beta-blocker as above   History of hematuria for which he was taken off Xarelto.  Will monitor closely while on anticoagulation.  Pancreatic mass/ ? liver mets: Gently hydrated overnight> Per radiology recommendation obtain CT pancreas as patient and son would like to get it done while here.  Discussed with the patient's risk of contrast but since creatinine/GFR is stable, should be able to tolerate.  We will monitor renal function  Normocytic anemia anemia of chronic disease hemoglobin evaluated and remains.  Monitor  Thrombo-cytopenia monitor Mild protein malnutrition dietitian consulted-augment nutritional status Hypomagnesemia repleted Gout continue allopurinol and colchicine  DVT prophylaxis:   Heparin drip Code Status:   Code Status: Full Code Family Communication: plan of care discussed with patient at bedside, called and updated his son 10/6 Patient status is: Admitted observation but remains hospitalized for IV heparin for significant bleeding  Level of care: Telemetry  Dispo: The patient is from: home            Anticipated disposition: home >2 days  Mobility Assessment (last 72 hours)     Mobility Assessment     Row Name 08/19/22 0800 08/18/22 2045         Does patient have an order for bedrest or is patient medically unstable No - Continue assessment No -  Continue assessment      What is the highest level of mobility based on the progressive mobility assessment? Level 4 (Walks with assist in room) -  Balance while marching in place and cannot step forward and back - Complete Level 3 (Stands with assist) - Balance while standing  and cannot march in place      Is the above level different from baseline mobility prior to current illness? Yes - Recommend PT order Yes - Recommend PT order                Objective: Vitals last 24 hrs: Vitals:   08/19/22 1357 08/19/22 2103 08/20/22 0500 08/20/22 0550  BP: 125/70 135/68  (!) 142/75  Pulse: 66 69  64  Resp: 18 (!) 21  20  Temp: 98.6 F (37 C) 98.3 F (36.8 C)  98.8 F (37.1 C)  TempSrc: Oral Oral  Oral  SpO2: 99% 99%  99%  Weight:   72 kg   Height:       Weight change: 1.3 kg  Physical Examination: General exam: alert awake, pleasant, elderly HEENT:Oral mucosa moist, Ear/Nose WNL grossly Respiratory system: Bilaterally clear BS,no use of accessory muscle Cardiovascular system: S1 & S2 +, No JVD. Gastrointestinal system: Abdomen soft,NT,ND, BS+ Nervous System: Alert, awake, moving extremities well Extremities: LE edema neg,distal peripheral pulses palpable.  Skin: No rashes,no icterus. MSK: Normal muscle bulk,tone, power   Medications reviewed:  Scheduled Meds:  allopurinol  100 mg Oral Daily   colchicine  0.6 mg Oral QODAY   irbesartan  300 mg Oral Daily   metoprolol succinate  25 mg Oral Daily   tamsulosin  0.4 mg Oral Daily  Continuous Infusions:  heparin 850 Units/hr (08/20/22 0342)    Diet Order             Diet Heart Room service appropriate? Yes; Fluid consistency: Thin  Diet effective now                  Intake/Output Summary (Last 24 hours) at 08/20/2022 0949 Last data filed at 08/20/2022 0700 Gross per 24 hour  Intake 704.39 ml  Output 1200 ml  Net -495.61 ml   Net IO Since Admission: -436.45 mL [08/20/22 0949]  Wt Readings from Last 3 Encounters:  08/20/22 72 kg  01/02/12 94.4 kg  09/19/11 91.3 kg     Unresulted Labs (From admission, onward)     Start     Ordered   08/20/22 1761  Basic  metabolic panel  Daily at 5am,   R     Question:  Specimen collection method  Answer:  Lab=Lab collect   08/19/22 1007   08/20/22 0500  Heparin level (unfractionated)  Daily at 5am,   R     Question:  Specimen collection method  Answer:  Lab=Lab collect   08/19/22 1925   08/19/22 0500  CBC  Daily at 5am,   R     Question:  Specimen collection method  Answer:  IV Team=IV Team collect   08/18/22 0324          Data Reviewed: I have personally reviewed following labs and imaging studies CBC: Recent Labs  Lab 08/17/22 2146 08/19/22 0000 08/20/22 0515  WBC 9.2 6.5 7.0  HGB 11.5* 10.5* 10.2*  HCT 33.8* 30.3* 29.1*  MCV 90.6 89.1 89.3  PLT 149* 137* 607*   Basic Metabolic Panel: Recent Labs  Lab 08/17/22 2213 08/18/22 0500 08/19/22 0823 08/20/22 0515  NA 144 142  139 139  K 4.4 4.4 4.4 4.1  CL 108 109 106 108  CO2 '27 25 27 26  '$ GLUCOSE 89 98 86 89  BUN 30* 25* 23 29*  CREATININE 1.14 1.04 1.16 1.23  CALCIUM 9.5 9.1 9.0 8.3*  MG  --  1.6*  --   --    GFR: Estimated Creatinine Clearance: 35.8 mL/min (by C-G formula based on SCr of 1.23 mg/dL). Liver Function Tests: Recent Labs  Lab 08/17/22 2213 08/18/22 0500  AST 18 30  ALT 12 12  ALKPHOS 60 70  BILITOT 0.9 1.1  PROT 6.7 6.3*  ALBUMIN 3.7 3.4*   Recent Labs  Lab 08/17/22 2213 08/18/22 0500  LIPASE 56* 49  No results found for this or any previous visit (from the past 240 hour(s)).  Antimicrobials: Anti-infectives (From admission, onward)    None      Culture/Microbiology    Component Value Date/Time   SDES URINE, CLEAN CATCH 07/21/2009 1905   SPECREQUEST NONE 07/21/2009 1905   CULT NO GROWTH 07/21/2009 1905   REPTSTATUS 07/23/2009 FINAL 07/21/2009 1905    Other culture-see note  Radiology Studies: ECHOCARDIOGRAM COMPLETE  Result Date: 08/18/2022    ECHOCARDIOGRAM REPORT   Patient Name:   WOODFORD STREGE Date of Exam: 08/18/2022 Medical Rec #:  [id number]      Height:       68.0 in Accession #:     [id number]     Weight:       155.0 lb Date of Birth:  15-Jul-1924       BSA:          1.834 m Patient Age:    89 years       BP:           148/79 mmHg Patient Gender: M              HR:           70 bpm. Exam Location:  Inpatient Procedure: 2D Echo Indications:    pulmonary embolus  History:        Patient has no prior history of Echocardiogram examinations.  Sonographer:    Harvie Junior Referring Phys: [id number] Rhetta Mura  Sonographer Comments: Image acquisition challenging due to respiratory motion and supine. IMPRESSIONS  1. Left ventricular ejection fraction, by estimation, is 40%. The left ventricle has mildly decreased function. The left ventricle demonstrates global hypokinesis. Left ventricular diastolic parameters are indeterminate.  2. Right ventricular systolic function is normal. The right ventricular size is normal. Tricuspid regurgitation signal is inadequate for assessing PA pressure.  3. The mitral valve is grossly normal. Trivial mitral valve regurgitation. No evidence of mitral stenosis.  4. The aortic valve is abnormal. There is mild calcification of the aortic valve. There is mild thickening of the aortic valve. Aortic valve regurgitation is not visualized. Aortic valve sclerosis is present, with no evidence of aortic valve stenosis.  5. The inferior vena cava is normal in size with greater than 50% respiratory variability, suggesting right atrial pressure of 3 mmHg. FINDINGS  Left Ventricle: Left ventricular ejection fraction, by estimation, is 40%. The left ventricle has mildly decreased function. The left ventricle demonstrates global hypokinesis. The left ventricular internal cavity size was normal in size. There is borderline left ventricular hypertrophy. Left ventricular diastolic parameters are indeterminate. Right Ventricle: The right ventricular size is normal. Right vetricular wall thickness was not well visualized. Right ventricular systolic function is normal. Tricuspid  regurgitation signal is  inadequate for assessing PA pressure. The tricuspid regurgitant velocity is 1.72 m/s, and with an assumed right atrial pressure of 3 mmHg, the estimated right ventricular systolic pressure is 10.2 mmHg. Left Atrium: Left atrial size was normal in size. Right Atrium: Right atrial size was normal in size. Pericardium: There is no evidence of pericardial effusion. Mitral Valve: The mitral valve is grossly normal. Trivial mitral valve regurgitation. No evidence of mitral valve stenosis. Tricuspid Valve: The tricuspid valve is grossly normal. Tricuspid valve regurgitation is trivial. Aortic Valve: The aortic valve is abnormal. There is mild calcification of the aortic valve. There is mild thickening of the aortic valve. Aortic valve regurgitation is not visualized. Aortic valve sclerosis is present, with no evidence of aortic valve stenosis. Aortic valve mean gradient measures 3.0 mmHg. Aortic valve peak gradient measures 6.6 mmHg. Aortic valve area, by VTI measures 2.20 cm. Pulmonic Valve: The pulmonic valve was not well visualized. Pulmonic valve regurgitation is not visualized. Aorta: The aortic root is normal in size and structure. Venous: The inferior vena cava is normal in size with greater than 50% respiratory variability, suggesting right atrial pressure of 3 mmHg. IAS/Shunts: The atrial septum is grossly normal.  LEFT VENTRICLE PLAX 2D LVIDd:         3.90 cm     Diastology LVIDs:         2.50 cm     LV e' medial:    4.03 cm/s LV PW:         1.00 cm     LV E/e' medial:  9.0 LV IVS:        1.00 cm     LV e' lateral:   6.20 cm/s LVOT diam:     2.00 cm     LV E/e' lateral: 5.9 LV SV:         55 LV SV Index:   30 LVOT Area:     3.14 cm  LV Volumes (MOD) LV vol d, MOD A2C: 73.9 ml LV vol d, MOD A4C: 53.2 ml LV vol s, MOD A2C: 35.0 ml LV vol s, MOD A4C: 31.9 ml LV SV MOD A2C:     38.9 ml LV SV MOD A4C:     53.2 ml LV SV MOD BP:      27.3 ml RIGHT VENTRICLE RV Basal diam:  4.50 cm RV Mid diam:     3.50 cm RV S prime:     13.60 cm/s TAPSE (M-mode): 2.6 cm LEFT ATRIUM           Index        RIGHT ATRIUM          Index LA diam:      2.80 cm 1.53 cm/m   RA Area:     9.45 cm LA Vol (A2C): 26.2 ml 14.29 ml/m  RA Volume:   20.00 ml 10.91 ml/m LA Vol (A4C): 44.6 ml 24.32 ml/m  AORTIC VALVE                    PULMONIC VALVE AV Area (Vmax):    2.06 cm     PV Vmax:       0.96 m/s AV Area (Vmean):   2.06 cm     PV Peak grad:  3.7 mmHg AV Area (VTI):     2.20 cm AV Vmax:           128.00 cm/s AV Vmean:          84.200  cm/s AV VTI:            0.251 m AV Peak Grad:      6.6 mmHg AV Mean Grad:      3.0 mmHg LVOT Vmax:         84.00 cm/s LVOT Vmean:        55.300 cm/s LVOT VTI:          0.176 m LVOT/AV VTI ratio: 0.70  AORTA Ao Root diam: 3.70 cm Ao Asc diam:  3.30 cm MITRAL VALVE               TRICUSPID VALVE MV Area (PHT): 3.27 cm    TR Peak grad:   11.8 mmHg MV Decel Time: 232 msec    TR Vmax:        172.00 cm/s MR Peak grad: 23.6 mmHg MR Vmax:      242.67 cm/s  SHUNTS MV E velocity: 36.40 cm/s  Systemic VTI:  0.18 m MV A velocity: 82.70 cm/s  Systemic Diam: 2.00 cm MV E/A ratio:  0.44 Cherlynn Kaiser MD Electronically signed by Cherlynn Kaiser MD Signature Date/Time: 08/18/2022/2:37:48 PM    Final      LOS: 1 day   Antonieta Pert, MD [facility]  08/20/2022, 9:49 AM

## 2022-08-20 NOTE — Plan of Care (Signed)
  Problem: Education: Goal: Knowledge of General Education information will improve Description: Including pain rating scale, medication(s)/side effects and non-pharmacologic comfort measures Outcome: Progressing   Problem: Clinical Measurements: Goal: Ability to maintain clinical measurements within normal limits will improve Outcome: Progressing Goal: Respiratory complications will improve Outcome: Progressing   Problem: Pain Managment: Goal: General experience of comfort will improve Outcome: Progressing

## 2022-08-20 NOTE — Progress Notes (Signed)
[place] for IV heparin Indication: PE/DVT  No Known Allergies  Patient Measurements: Height: 6' (182.9 cm) Weight: 72 kg (158 lb 11.7 oz) IBW/kg (Calculated) : 77.6 Heparin Dosing Weight: total body weight   Vital Signs: Temp: 98.8 F (37.1 C) (10/07 0550) Temp Source: Oral (10/07 0550) BP: 142/75 (10/07 0550) Pulse Rate: 64 (10/07 0550)  Labs: Recent Labs    08/17/22 2146 08/17/22 2213 08/17/22 2213 08/18/22 0041 08/18/22 0500 08/18/22 1330 08/19/22 0000 08/19/22 0537 08/19/22 0823 08/19/22 0910 08/19/22 1834 08/20/22 0515  HGB 11.5*  --   --   --   --   --  10.5*  --   --   --   --  10.2*  HCT 33.8*  --   --   --   --   --  30.3*  --   --   --   --  29.1*  PLT 149*  --   --   --   --   --  137*  --   --   --   --  138*  LABPROT  --  14.5  --   --   --   --   --   --   --   --   --   --   INR  --  1.1  --   --   --   --   --   --   --   --   --   --   HEPARINUNFRC  --   --   --   --   --    < >  --    < >  --  0.90* 0.47 0.49  CREATININE  --  1.14   < >  --  1.04  --   --   --  1.16  --   --  1.23  TROPONINIHS  --  9  --  12  --   --   --   --   --   --   --   --    < > = values in this interval not displayed.     Estimated Creatinine Clearance: 35.8 mL/min (by C-G formula based on SCr of 1.23 mg/dL).  Assessment: 88 year old male presenting to the ED with back pain. Woke up this morning, pain radiating down left leg. Soon after developed chest and abdominal pain, diaphoresis, and feeling "unsettled". CT reveals left lobar PE with concern for heart strain. Bilateral lower extremity venous duplex + for LLE DVT. Patient had been on warfarin previously, then on Xarelto which was discontinued about a month ago due to hematuria. Pharmacy consulted for IV heparin dosing.   Baseline labs: INR 1.1, Hgb 11.5, Pltc 149K  Today, 08/20/22: Heparin level = 0.49 units/mL, remains therapeutic on heparin infusion at 850 units/hr CBC:  Hgb 10.2, low but relatively stable. Pltc low but stable at 138K No bleeding or infusion issues noted per RN  Goal of Therapy:  Heparin level 0.3-0.7 units/ml Monitor platelets by anticoagulation protocol: Yes   Plan:  Continue heparin infusion at 850 units/hr Daily CBC (monitor Pltc closely), heparin level Monitor closely for s/sx of bleeding F/u for transition to oral anticoagulation   Lindell Spar, PharmD, BCPS Clinical Pharmacist  08/20/2022, 9:26 AM

## 2022-08-21 DIAGNOSIS — I2699 Other pulmonary embolism without acute cor pulmonale: Secondary | ICD-10-CM | POA: Diagnosis not present

## 2022-08-21 DIAGNOSIS — I5021 Acute systolic (congestive) heart failure: Secondary | ICD-10-CM | POA: Diagnosis not present

## 2022-08-21 LAB — CBC
HCT: 29.2 % — ABNORMAL LOW (ref 39.0–52.0)
Hemoglobin: 10.2 g/dL — ABNORMAL LOW (ref 13.0–17.0)
MCH: 31.6 pg (ref 26.0–34.0)
MCHC: 34.9 g/dL (ref 30.0–36.0)
MCV: 90.4 fL (ref 80.0–100.0)
Platelets: 140 10*3/uL — ABNORMAL LOW (ref 150–400)
RBC: 3.23 MIL/uL — ABNORMAL LOW (ref 4.22–5.81)
RDW: 15.3 % (ref 11.5–15.5)
WBC: 6.6 10*3/uL (ref 4.0–10.5)
nRBC: 0 % (ref 0.0–0.2)

## 2022-08-21 LAB — BASIC METABOLIC PANEL
Anion gap: 5 (ref 5–15)
BUN: 34 mg/dL — ABNORMAL HIGH (ref 8–23)
CO2: 26 mmol/L (ref 22–32)
Calcium: 8.6 mg/dL — ABNORMAL LOW (ref 8.9–10.3)
Chloride: 109 mmol/L (ref 98–111)
Creatinine, Ser: 1.36 mg/dL — ABNORMAL HIGH (ref 0.61–1.24)
GFR, Estimated: 48 mL/min — ABNORMAL LOW (ref >60)
Glucose, Bld: 84 mg/dL (ref 70–99)
Potassium: 4.7 mmol/L (ref 3.5–5.1)
Sodium: 140 mmol/L (ref 135–145)

## 2022-08-21 LAB — HEPARIN LEVEL (UNFRACTIONATED): Heparin Unfractionated: 0.4 IU/mL (ref 0.30–0.70)

## 2022-08-21 MED ORDER — APIXABAN 5 MG PO TABS
10.0000 mg | ORAL_TABLET | Freq: Two times a day (BID) | ORAL | Status: DC
  Administered 2022-08-21 – 2022-08-22 (×2): 10 mg via ORAL
  Filled 2022-08-21 (×2): qty 2

## 2022-08-21 MED ORDER — CARVEDILOL 3.125 MG PO TABS
3.1250 mg | ORAL_TABLET | Freq: Two times a day (BID) | ORAL | Status: DC
  Administered 2022-08-21 – 2022-08-22 (×2): 3.125 mg via ORAL
  Filled 2022-08-21 (×2): qty 1

## 2022-08-21 MED ORDER — COLCHICINE 0.3 MG HALF TABLET
0.3000 mg | ORAL_TABLET | ORAL | Status: DC
  Administered 2022-08-21: 0.3 mg via ORAL
  Filled 2022-08-21: qty 1

## 2022-08-21 MED ORDER — APIXABAN 5 MG PO TABS: 5.0000 mg | ORAL_TABLET | Freq: Two times a day (BID) | ORAL | Status: DC

## 2022-08-21 MED ORDER — SODIUM CHLORIDE 0.9 % IV SOLN: INTRAVENOUS | Status: AC

## 2022-08-21 NOTE — Progress Notes (Signed)
Pt care assumed at 1500 this shift. I have reviewed the previous RN's assessment and agree with their findings. Pt currently sitting up in the chair comfortably in no acute distress. Will continue plan of care.

## 2022-08-21 NOTE — Consult Note (Addendum)
[place]  [phone] Fax:([phone] [place]    Referral MD  Reason for Referral: pancreatic mass and liver lesions  Chief Complaint  Patient presents with   Back Pain   Leg Pain   HPI:   This is a very pleasant 88 year old male patient with history of chronic pain syndrome, back surgery, degenerative disc disease of lumbosacral spine, history of hypercoagulable state previously on warfarin then on Xarelto currently admitted with chief complaint of back pain, lower extremity pain.  He also admitted to weight loss.  He denied any abdominal pain or change in bowel habits.  He had CT chest abdomen pelvis which showed left lower lobe pulmonary embolism with moderate clot burden, possible right heart strain as well as a hypoenhancing mass in the pancreatic tail measuring 5.5 x 5.4 cm.  Pancreatic protocol imaging recommended. He had CT pancreatic protocol yesterday which once again showed 5.5 cm enhancing mass in the pancreatic tail, multifocal hepatic metastasis measuring up to 2.2 cm in the posterior right hepatic dome. Medical oncology was consulted for recommendations.  Past Medical History:  Diagnosis Date   Anal warts    Carcinoma in situ of anal margin    Chills    Chronic pain syndrome 10/16/2012   Clotting disorder ([place])    DDD (degenerative disc disease), lumbosacral 04/30/2013   Essential hypertension 08/18/2022   Gout 08/18/2022   Hypertension    Leg swelling    Lumbar post-laminectomy syndrome 10/16/2012   Ulcer   :   Past Surgical History:  Procedure Laterality Date   ANUS SURGERY  08/25/11   fulguration anal warts    BACK SURGERY  06/23/09   HERNIA REPAIR     LAPAROSCOPIC GASTROTOMY W/ REPAIR OF ULCER    :   Current Facility-Administered Medications  Medication Dose Route Frequency Provider Last Rate Last Admin   0.9 %  sodium chloride infusion   Intravenous Continuous Kc, Ramesh, MD 75  mL/hr at 08/21/22 1000 New Bag at 08/21/22 1000   acetaminophen (TYLENOL) tablet 650 mg  650 mg Oral Q6H PRN Howerter, Justin B, DO   650 mg at 08/19/22 1225   Or   acetaminophen (TYLENOL) suppository 650 mg  650 mg Rectal Q6H PRN Howerter, Justin B, DO       allopurinol (ZYLOPRIM) tablet 100 mg  100 mg Oral Daily Reubin Milan, MD   100 mg at 08/21/22 0918   carvedilol (COREG) tablet 3.125 mg  3.125 mg Oral BID WC Minus Breeding, MD       colchicine tablet 0.3 mg  0.3 mg Oral Jeffie Pollock, Ramesh, MD   0.3 mg at 08/21/22 1117   heparin ADULT infusion 100 units/mL (25000 units/297m)  850 Units/hr Intravenous Continuous GLuiz Ochoa RPH 8.5 mL/hr at 08/21/22 0957 850 Units/hr at 08/21/22 0957   naloxone (NARCAN) injection 0.4 mg  0.4 mg Intravenous PRN Howerter, Justin B, DO       ondansetron (ZOFRAN) injection 4 mg  4 mg Intravenous Q6H PRN Howerter, Justin B, DO       tamsulosin (FLOMAX) capsule 0.4 mg  0.4 mg Oral Daily OReubin Milan MD   0.4 mg at 08/21/22 0919     No Known Allergies:   Family History  Problem Relation Age of Onset   Cancer Father        lung  :   Social History   Socioeconomic History   Marital status:  Married    Spouse name: Not on file   Number of children: Not on file   Years of education: Not on file   Highest education level: Not on file  Occupational History   Not on file  Tobacco Use   Smoking status: Former    Types: Cigarettes    Quit date: 01/01/1982    Years since quitting: 40.6   Smokeless tobacco: Never  Substance and Sexual Activity   Alcohol use: No   Drug use: No   Sexual activity: Not on file  Other Topics Concern   Not on file  Social History Narrative   Not on file   Social Determinants of Health   Financial Resource Strain: Not on file  Food Insecurity: No Food Insecurity (08/18/2022)   Hunger Vital Sign    Worried About Running Out of Food in the Last Year: Never true    Ran Out of Food in the Last Year:  Never true  Transportation Needs: No Transportation Needs (08/18/2022)   PRAPARE - Hydrologist (Medical): No    Lack of Transportation (Non-Medical): No  Physical Activity: Not on file  Stress: Not on file  Social Connections: Not on file  Intimate Partner Violence: Not At Risk (08/18/2022)   Humiliation, Afraid, Rape, and Kick questionnaire    Fear of Current or Ex-Partner: No    Emotionally Abused: No    Physically Abused: No    Sexually Abused: No  :    Exam: Patient Vitals for the past 24 hrs:  BP Temp Temp src Pulse Resp SpO2  08/21/22 0421 135/74 97.9 F (36.6 C) -- 65 17 99 %  08/20/22 2111 (!) 107/52 98.3 F (36.8 C) -- 69 18 100 %  08/20/22 1306 111/60 98.5 F (36.9 C) Oral 65 18 100 %    Physical Exam Constitutional:      Appearance: Normal appearance.  Cardiovascular:     Pulses: Normal pulses.     Heart sounds: Normal heart sounds.  Pulmonary:     Effort: Pulmonary effort is normal.     Breath sounds: Normal breath sounds.  Abdominal:     General: Abdomen is flat.     Palpations: Abdomen is soft.  Musculoskeletal:     Cervical back: Normal range of motion and neck supple. No rigidity.  Lymphadenopathy:     Cervical: No cervical adenopathy.  Skin:    General: Skin is warm and dry.  Neurological:     General: No focal deficit present.     Mental Status: He is alert.  Psychiatric:        Mood and Affect: Mood normal.       Lab Results  Component Value Date   WBC 6.6 08/21/2022   HGB 10.2 (L) 08/21/2022   HCT 29.2 (L) 08/21/2022   PLT 140 (L) 08/21/2022   GLUCOSE 84 08/21/2022   ALT 12 08/18/2022   AST 30 08/18/2022   NA 140 08/21/2022   K 4.7 08/21/2022   CL 109 08/21/2022   CREATININE 1.36 (H) 08/21/2022   BUN 34 (H) 08/21/2022   CO2 26 08/21/2022    CT PANCREAS ABD W/WO  Result Date: 08/20/2022 CLINICAL DATA:  Pancreatic mass EXAM: CT ABDOMEN WITHOUT AND WITH CONTRAST TECHNIQUE: Multidetector CT imaging  of the abdomen was performed following the standard protocol before and following the bolus administration of intravenous contrast. RADIATION DOSE REDUCTION: This exam was performed according to the departmental dose-optimization program which  includes automated exposure control, adjustment of the mA and/or kV according to patient size and/or use of iterative reconstruction technique. CONTRAST:  113m OMNIPAQUE IOHEXOL 300 MG/ML  SOLN COMPARISON:  CT abdomen/pelvis dated 08/18/2022 FINDINGS: Lower chest: Left lower lobe opacity and trace bilateral pleural effusions, unchanged. Hepatobiliary: Multifocal hepatic metastases, approximately 10 in number, measuring up to 2.2 cm in the posterior right hepatic dome (series 9/image 22). Gallbladder is unremarkable. No intrahepatic or extrahepatic duct dilatation. Pancreas: 5.5 x 4.6 cm enhancing mass along the pancreatic tail (series 9/image 39), compatible with primary pancreatic neoplasm. Given the location, there is no associated vascular invasion. Spleen: Within normal limits. Adrenals/Urinary Tract: Adrenal glands are within normal limits. Multiple bilateral renal cysts, some of which are hyperdense/hemorrhagic, unchanged from recent CT, benign (Bosniak I-II). No follow-up is recommended. No hydronephrosis. Stomach/Bowel: Stomach is within normal limits. Visualized bowel is unremarkable, including a normal appendix (series 9/image 86). Vascular/Lymphatic: No evidence of abdominal aortic aneurysm. Atherosclerotic calcifications of the abdominal aorta and branch vessels. No suspicious abdominal lymphadenopathy. Other: No abdominal ascites. Musculoskeletal: Degenerative changes of the visualized thoracolumbar spine. IMPRESSION: 5.5 cm enhancing mass along the pancreatic tail, compatible with primary pancreatic neoplasm. No associated vascular invasion. Multifocal hepatic metastases, measuring up to 2.2 cm in the posterior right hepatic dome. Otherwise unchanged from recent  study. Electronically Signed   By: SJulian HyM.D.   On: 08/20/2022 20:12   VAS [place]OWER EXTREMITY VENOUS (DVT)  Result Date: 08/18/2022  Lower Venous DVT Study Patient Name:  Peter Hall Date of Exam:   08/18/2022 Medical Rec #: [id number]      Accession #:    [id number] of Birth: 226-Mar-1925       Patient Gender: M Patient Age:   89years [facility]:      VAS [place] EXTREMITY VENOUS (DVT) Referring Phys: DAVID ORTIZ --------------------------------------------------------------------------------  Indications: Pulmonary embolism.  Risk Factors: Confirmed PE. Comparison Study: No prior studies. Performing Technologist: GOliver HumRVT  Examination Guidelines: A complete evaluation includes B-mode imaging, spectral Doppler, color Doppler, and power Doppler as needed of all accessible portions of each vessel. Bilateral testing is considered an integral part of a complete examination. Limited examinations for reoccurring indications may be performed as noted. The reflux portion of the exam is performed with the patient in reverse Trendelenburg.  +---------+---------------+---------+-----------+----------+--------------+ RIGHT    CompressibilityPhasicitySpontaneityPropertiesThrombus Aging +---------+---------------+---------+-----------+----------+--------------+ CFV      Full           Yes      Yes                                 +---------+---------------+---------+-----------+----------+--------------+ SFJ      Full                                                        +---------+---------------+---------+-----------+----------+--------------+ FV Prox  Full                                                        +---------+---------------+---------+-----------+----------+--------------+ FV  Mid   Full                                                         +---------+---------------+---------+-----------+----------+--------------+ FV DistalFull                                                        +---------+---------------+---------+-----------+----------+--------------+ PFV      Full                                                        +---------+---------------+---------+-----------+----------+--------------+ POP      Full           Yes      Yes                                 +---------+---------------+---------+-----------+----------+--------------+ PTV      Full                                                        +---------+---------------+---------+-----------+----------+--------------+ PERO     Full                                                        +---------+---------------+---------+-----------+----------+--------------+   +---------+---------------+---------+-----------+----------+--------------+ LEFT     CompressibilityPhasicitySpontaneityPropertiesThrombus Aging +---------+---------------+---------+-----------+----------+--------------+ CFV      Full           Yes      Yes                                 +---------+---------------+---------+-----------+----------+--------------+ SFJ      Full                                                        +---------+---------------+---------+-----------+----------+--------------+ FV Prox  Full                                                        +---------+---------------+---------+-----------+----------+--------------+ FV Mid                  Yes      Yes                                 +---------+---------------+---------+-----------+----------+--------------+  FV Distal               Yes      Yes                                 +---------+---------------+---------+-----------+----------+--------------+ PFV      Full                                                         +---------+---------------+---------+-----------+----------+--------------+ POP      Full           Yes      Yes                                 +---------+---------------+---------+-----------+----------+--------------+ PTV      None                                         Acute          +---------+---------------+---------+-----------+----------+--------------+ PERO     Full                                                        +---------+---------------+---------+-----------+----------+--------------+     Summary: RIGHT: - There is no evidence of deep vein thrombosis in the lower extremity.  - No cystic structure found in the popliteal fossa.  LEFT: - Findings consistent with acute deep vein thrombosis involving the left posterior tibial veins. - No cystic structure found in the popliteal fossa.  *See table(s) above for measurements and observations. Electronically signed by Jamelle Haring on 08/18/2022 at 4:37:41 PM.    Final    ECHOCARDIOGRAM COMPLETE  Result Date: 08/18/2022    ECHOCARDIOGRAM REPORT   Patient Name:   Peter Hall Date of Exam: 08/18/2022 Medical Rec #:  [id number]      Height:       68.0 in Accession #:    [id number]     Weight:       155.0 lb Date of Birth:  29-Nov-1923       BSA:          1.834 m Patient Age:    60 years       BP:           148/79 mmHg Patient Gender: M              HR:           70 bpm. Exam Location:  Inpatient Procedure: 2D Echo Indications:    pulmonary embolus  History:        Patient has no prior history of Echocardiogram examinations.  Sonographer:    Harvie Junior Referring Phys: [id number] Rhetta Mura  Sonographer Comments: Image acquisition challenging due to respiratory motion and supine. IMPRESSIONS  1. Left ventricular ejection fraction, by estimation, is 40%. The left ventricle has mildly decreased function. The left ventricle demonstrates global hypokinesis. Left ventricular diastolic parameters are  indeterminate.  2. Right ventricular  systolic function is normal. The right ventricular size is normal. Tricuspid regurgitation signal is inadequate for assessing PA pressure.  3. The mitral valve is grossly normal. Trivial mitral valve regurgitation. No evidence of mitral stenosis.  4. The aortic valve is abnormal. There is mild calcification of the aortic valve. There is mild thickening of the aortic valve. Aortic valve regurgitation is not visualized. Aortic valve sclerosis is present, with no evidence of aortic valve stenosis.  5. The inferior vena cava is normal in size with greater than 50% respiratory variability, suggesting right atrial pressure of 3 mmHg. FINDINGS  Left Ventricle: Left ventricular ejection fraction, by estimation, is 40%. The left ventricle has mildly decreased function. The left ventricle demonstrates global hypokinesis. The left ventricular internal cavity size was normal in size. There is borderline left ventricular hypertrophy. Left ventricular diastolic parameters are indeterminate. Right Ventricle: The right ventricular size is normal. Right vetricular wall thickness was not well visualized. Right ventricular systolic function is normal. Tricuspid regurgitation signal is inadequate for assessing PA pressure. The tricuspid regurgitant velocity is 1.72 m/s, and with an assumed right atrial pressure of 3 mmHg, the estimated right ventricular systolic pressure is 02.5 mmHg. Left Atrium: Left atrial size was normal in size. Right Atrium: Right atrial size was normal in size. Pericardium: There is no evidence of pericardial effusion. Mitral Valve: The mitral valve is grossly normal. Trivial mitral valve regurgitation. No evidence of mitral valve stenosis. Tricuspid Valve: The tricuspid valve is grossly normal. Tricuspid valve regurgitation is trivial. Aortic Valve: The aortic valve is abnormal. There is mild calcification of the aortic valve. There is mild thickening of the aortic valve. Aortic valve regurgitation is not  visualized. Aortic valve sclerosis is present, with no evidence of aortic valve stenosis. Aortic valve mean gradient measures 3.0 mmHg. Aortic valve peak gradient measures 6.6 mmHg. Aortic valve area, by VTI measures 2.20 cm. Pulmonic Valve: The pulmonic valve was not well visualized. Pulmonic valve regurgitation is not visualized. Aorta: The aortic root is normal in size and structure. Venous: The inferior vena cava is normal in size with greater than 50% respiratory variability, suggesting right atrial pressure of 3 mmHg. IAS/Shunts: The atrial septum is grossly normal.  LEFT VENTRICLE PLAX 2D LVIDd:         3.90 cm     Diastology LVIDs:         2.50 cm     LV e' medial:    4.03 cm/s LV PW:         1.00 cm     LV E/e' medial:  9.0 LV IVS:        1.00 cm     LV e' lateral:   6.20 cm/s LVOT diam:     2.00 cm     LV E/e' lateral: 5.9 LV SV:         55 LV SV Index:   30 LVOT Area:     3.14 cm  LV Volumes (MOD) LV vol d, MOD A2C: 73.9 ml LV vol d, MOD A4C: 53.2 ml LV vol s, MOD A2C: 35.0 ml LV vol s, MOD A4C: 31.9 ml LV SV MOD A2C:     38.9 ml LV SV MOD A4C:     53.2 ml LV SV MOD BP:      27.3 ml RIGHT VENTRICLE RV Basal diam:  4.50 cm RV Mid diam:    3.50 cm RV S prime:     13.60  cm/s TAPSE (M-mode): 2.6 cm LEFT ATRIUM           Index        RIGHT ATRIUM          Index LA diam:      2.80 cm 1.53 cm/m   RA Area:     9.45 cm LA Vol (A2C): 26.2 ml 14.29 ml/m  RA Volume:   20.00 ml 10.91 ml/m LA Vol (A4C): 44.6 ml 24.32 ml/m  AORTIC VALVE                    PULMONIC VALVE AV Area (Vmax):    2.06 cm     PV Vmax:       0.96 m/s AV Area (Vmean):   2.06 cm     PV Peak grad:  3.7 mmHg AV Area (VTI):     2.20 cm AV Vmax:           128.00 cm/s AV Vmean:          84.200 cm/s AV VTI:            0.251 m AV Peak Grad:      6.6 mmHg AV Mean Grad:      3.0 mmHg LVOT Vmax:         84.00 cm/s LVOT Vmean:        55.300 cm/s LVOT VTI:          0.176 m LVOT/AV VTI ratio: 0.70  AORTA Ao Root diam: 3.70 cm Ao Asc diam:  3.30 cm  MITRAL VALVE               TRICUSPID VALVE MV Area (PHT): 3.27 cm    TR Peak grad:   11.8 mmHg MV Decel Time: 232 msec    TR Vmax:        172.00 cm/s MR Peak grad: 23.6 mmHg MR Vmax:      242.67 cm/s  SHUNTS MV E velocity: 36.40 cm/s  Systemic VTI:  0.18 m MV A velocity: 82.70 cm/s  Systemic Diam: 2.00 cm MV E/A ratio:  0.44 Cherlynn Kaiser MD Electronically signed by Cherlynn Kaiser MD Signature Date/Time: 08/18/2022/2:37:48 PM    Final    CT Angio Chest/Abd/Pel for Dissection W and/or Wo Contrast  Result Date: 08/18/2022 CLINICAL DATA:  Left back pain radiating to abdomen EXAM: CT ANGIOGRAPHY CHEST, ABDOMEN AND PELVIS TECHNIQUE: Non-contrast CT of the chest was initially obtained. Multidetector CT imaging through the chest, abdomen and pelvis was performed using the standard protocol during bolus administration of intravenous contrast. Multiplanar reconstructed images and MIPs were obtained and reviewed to evaluate the vascular anatomy. RADIATION DOSE REDUCTION: This exam was performed according to the departmental dose-optimization program which includes automated exposure control, adjustment of the mA and/or kV according to patient size and/or use of iterative reconstruction technique. CONTRAST:  140m OMNIPAQUE IOHEXOL 350 MG/ML SOLN COMPARISON:  CTA chest dated 10/28/2010 FINDINGS: CTA CHEST FINDINGS Cardiovascular: Unenhanced CT, there is no evidence of intrarenal hematoma. Following contrast administration, there is no evidence of thoracic aortic aneurysm or dissection. Atherosclerotic calcifications. Although not tailored for evaluation of the pulmonary arteries, there is a lobar pulmonary embolism in the left lower lobe (series 7/image 36). Clot burden is moderate. Elevated RV to LV ratio (1.1), raising the possibility of right heart strain. The heart is normal in size.  No pericardial effusion. Three vessel coronary sclerosis. Mediastinum/Nodes: No suspicious mediastinal lymphadenopathy. Enlarged  thyroid with multiple dominant left thyroid  nodules measuring up to 2.9 cm (series 7/image 1). These have been previously characterized on multiple thyroid ultrasounds. This has been evaluated on previous imaging. (ref: J Am Coll Radiol. 2015 Feb;12(2): 143-50). Lungs/Pleura: Mild eventration left hemidiaphragm with associated left basilar atelectasis. Additional mild dependent atelectasis in the posterior right upper and lower lobes. No focal consolidation. No suspicious pulmonary nodules. Calcified pleural plaques. No pleural effusion or pneumothorax. Musculoskeletal: Degenerative changes of the thoracic spine. Review of the MIP images confirms the above findings. CTA ABDOMEN AND PELVIS FINDINGS VASCULAR Aorta: No evidence abdominal aortic aneurysm or dissection. Patent. Atherosclerotic calcifications. Celiac: Patent.  Atherosclerotic calcifications at the origin. SMA: Patent.  Atherosclerotic calcifications at the origin. Renals: Patent. Atherosclerotic calcifications at the origin bilaterally. IMA: Patent. Inflow: Patent bilaterally.  Atherosclerotic calcifications. Veins: Grossly unremarkable. Review of the MIP images confirms the above findings. NON-VASCULAR Motion degraded images. Hepatobiliary: Liver is poorly evaluated due to motion degradation. However, there are at least 3 hypoenhancing lesions suspected, measuring up to 1.9 cm inferiorly in segment 6 (series 7/image 93), incompletely characterized. Gallbladder is unremarkable. No intrahepatic or extrahepatic duct dilatation. Pancreas: 5.5 x 5.4 cm hypoenhancing mass in the pancreatic tail (series 7/image 78), poorly evaluated. Although benign etiologies (complex cystic lesion or pseudocyst) remain possible, a primary pancreatic neoplasm is considered the diagnosis of exclusion. Spleen: Within normal limits. Adrenals/Urinary Tract: Adrenal glands are within normal limits. Bilateral renal cysts, measuring up to 4.7 cm in the right upper kidney (series  7/image 36), measuring simple fluid density, benign (Bosniak I). No hydronephrosis. Bladder is within normal limits. Stomach/Bowel: Stomach is within normal limits. No evidence of bowel obstruction. Normal appendix (series 7/image 128). Scattered colonic diverticulosis, without evidence of diverticulitis. Lymphatic: No suspicious abdominopelvic lymphadenopathy. Reproductive: Marked prostatomegaly, suggesting BPH. Other: No abdominopelvic ascites. Tiny fat containing left inguinal hernia. Small right inguinal hernia containing loops of nondilated small bowel. Musculoskeletal: Mild degenerative changes of the lumbar spine. Review of the MIP images confirms the above findings. IMPRESSION: No evidence of thoracoabdominal aortic aneurysm or dissection. Left lower lobe lobar pulmonary embolism. Clot burden is moderate. Elevated RV to LV ratio, raising the possibility of right heart strain. 5.4 x 5.4 cm hypoenhancing mass in the pancreatic tail, poorly evaluated. While benign etiologies remain possible, a primary pancreatic neoplasm is considered the diagnosis of exclusion. Follow-up pancreatic protocol CT or MR (with/without contrast) can be considered for further characterization as an outpatient, if warranted at this patient's age. Given the motion degradation on the current study, CT may be better than MR. Suspected hepatic lesions, poorly visualized/evaluated. If the patient has a primary pancreatic malignancy, these would be worrisome for metastases. These could be assessed at the time of follow-up. Additional ancillary findings as above. These results were called by telephone at the time of interpretation on 08/18/2022 at 2:26 am to provider Kirstie Mirza, who verbally acknowledged these results. Electronically Signed   By: Julian Hy M.D.   On: 08/18/2022 02:27    Pathology:None  CT PANCREAS ABD W/WO  Result Date: 08/20/2022 CLINICAL DATA:  Pancreatic mass EXAM: CT ABDOMEN WITHOUT AND WITH CONTRAST  TECHNIQUE: Multidetector CT imaging of the abdomen was performed following the standard protocol before and following the bolus administration of intravenous contrast. RADIATION DOSE REDUCTION: This exam was performed according to the departmental dose-optimization program which includes automated exposure control, adjustment of the mA and/or kV according to patient size and/or use of iterative reconstruction technique. CONTRAST:  181m OMNIPAQUE IOHEXOL 300 MG/ML  SOLN COMPARISON:  CT abdomen/pelvis dated 08/18/2022 FINDINGS: Lower chest: Left lower lobe opacity and trace bilateral pleural effusions, unchanged. Hepatobiliary: Multifocal hepatic metastases, approximately 10 in number, measuring up to 2.2 cm in the posterior right hepatic dome (series 9/image 22). Gallbladder is unremarkable. No intrahepatic or extrahepatic duct dilatation. Pancreas: 5.5 x 4.6 cm enhancing mass along the pancreatic tail (series 9/image 39), compatible with primary pancreatic neoplasm. Given the location, there is no associated vascular invasion. Spleen: Within normal limits. Adrenals/Urinary Tract: Adrenal glands are within normal limits. Multiple bilateral renal cysts, some of which are hyperdense/hemorrhagic, unchanged from recent CT, benign (Bosniak I-II). No follow-up is recommended. No hydronephrosis. Stomach/Bowel: Stomach is within normal limits. Visualized bowel is unremarkable, including a normal appendix (series 9/image 86). Vascular/Lymphatic: No evidence of abdominal aortic aneurysm. Atherosclerotic calcifications of the abdominal aorta and branch vessels. No suspicious abdominal lymphadenopathy. Other: No abdominal ascites. Musculoskeletal: Degenerative changes of the visualized thoracolumbar spine. IMPRESSION: 5.5 cm enhancing mass along the pancreatic tail, compatible with primary pancreatic neoplasm. No associated vascular invasion. Multifocal hepatic metastases, measuring up to 2.2 cm in the posterior right hepatic  dome. Otherwise unchanged from recent study. Electronically Signed   By: Julian Hy M.D.   On: 08/20/2022 20:12   VAS [place] LOWER EXTREMITY VENOUS (DVT)  Result Date: 08/18/2022  Lower Venous DVT Study Patient Name:  Peter Hall  Date of Exam:   08/18/2022 Medical Rec #: [id number]       Accession #:    [id number] Date of Birth: [birth year]        Patient Gender: M Patient Age:   44 years Exam Location:  [facility] Procedure:      VAS [place] LOWER EXTREMITY VENOUS (DVT) Referring Phys: DAVID ORTIZ --------------------------------------------------------------------------------  Indications: Pulmonary embolism.  Risk Factors: Confirmed PE. Comparison Study: No prior studies. Performing Technologist: Oliver Hum RVT  Examination Guidelines: A complete evaluation includes B-mode imaging, spectral Doppler, color Doppler, and power Doppler as needed of all accessible portions of each vessel. Bilateral testing is considered an integral part of a complete examination. Limited examinations for reoccurring indications may be performed as noted. The reflux portion of the exam is performed with the patient in reverse Trendelenburg.  +---------+---------------+---------+-----------+----------+--------------+ RIGHT    CompressibilityPhasicitySpontaneityPropertiesThrombus Aging +---------+---------------+---------+-----------+----------+--------------+ CFV      Full           Yes      Yes                                 +---------+---------------+---------+-----------+----------+--------------+ SFJ      Full                                                        +---------+---------------+---------+-----------+----------+--------------+ FV Prox  Full                                                        +---------+---------------+---------+-----------+----------+--------------+ FV Mid   Full                                                         +---------+---------------+---------+-----------+----------+--------------+  FV DistalFull                                                        +---------+---------------+---------+-----------+----------+--------------+ PFV      Full                                                        +---------+---------------+---------+-----------+----------+--------------+ POP      Full           Yes      Yes                                 +---------+---------------+---------+-----------+----------+--------------+ PTV      Full                                                        +---------+---------------+---------+-----------+----------+--------------+ PERO     Full                                                        +---------+---------------+---------+-----------+----------+--------------+   +---------+---------------+---------+-----------+----------+--------------+ LEFT     CompressibilityPhasicitySpontaneityPropertiesThrombus Aging +---------+---------------+---------+-----------+----------+--------------+ CFV      Full           Yes      Yes                                 +---------+---------------+---------+-----------+----------+--------------+ SFJ      Full                                                        +---------+---------------+---------+-----------+----------+--------------+ FV Prox  Full                                                        +---------+---------------+---------+-----------+----------+--------------+ FV Mid                  Yes      Yes                                 +---------+---------------+---------+-----------+----------+--------------+ FV Distal               Yes      Yes                                 +---------+---------------+---------+-----------+----------+--------------+  PFV      Full                                                         +---------+---------------+---------+-----------+----------+--------------+ POP      Full           Yes      Yes                                 +---------+---------------+---------+-----------+----------+--------------+ PTV      None                                         Acute          +---------+---------------+---------+-----------+----------+--------------+ PERO     Full                                                        +---------+---------------+---------+-----------+----------+--------------+     Summary: RIGHT: - There is no evidence of deep vein thrombosis in the lower extremity.  - No cystic structure found in the popliteal fossa.  LEFT: - Findings consistent with acute deep vein thrombosis involving the left posterior tibial veins. - No cystic structure found in the popliteal fossa.  *See table(s) above for measurements and observations. Electronically signed by Jamelle Haring on 08/18/2022 at 4:37:41 PM.    Final    ECHOCARDIOGRAM COMPLETE  Result Date: 08/18/2022    ECHOCARDIOGRAM REPORT   Patient Name:   Peter Hall Date of Exam: 08/18/2022 Medical Rec #:  [id number]      Height:       68.0 in Accession #:    [id number]     Weight:       155.0 lb Date of Birth:  [birth year]       BSA:          1.834 m Patient Age:    42 years       BP:           148/79 mmHg Patient Gender: M              HR:           70 bpm. Exam Location:  Inpatient Procedure: 2D Echo Indications:    pulmonary embolus  History:        Patient has no prior history of Echocardiogram examinations.  Sonographer:    Harvie Junior Referring Phys: [id number] Rhetta Mura  Sonographer Comments: Image acquisition challenging due to respiratory motion and supine. IMPRESSIONS  1. Left ventricular ejection fraction, by estimation, is 40%. The left ventricle has mildly decreased function. The left ventricle demonstrates global hypokinesis. Left ventricular diastolic parameters are indeterminate.  2. Right ventricular  systolic function is normal. The right ventricular size is normal. Tricuspid regurgitation signal is inadequate for assessing PA pressure.  3. The mitral valve is grossly normal. Trivial mitral valve regurgitation. No evidence of mitral stenosis.  4. The aortic valve is abnormal. There is mild calcification of the aortic  valve. There is mild thickening of the aortic valve. Aortic valve regurgitation is not visualized. Aortic valve sclerosis is present, with no evidence of aortic valve stenosis.  5. The inferior vena cava is normal in size with greater than 50% respiratory variability, suggesting right atrial pressure of 3 mmHg. FINDINGS  Left Ventricle: Left ventricular ejection fraction, by estimation, is 40%. The left ventricle has mildly decreased function. The left ventricle demonstrates global hypokinesis. The left ventricular internal cavity size was normal in size. There is borderline left ventricular hypertrophy. Left ventricular diastolic parameters are indeterminate. Right Ventricle: The right ventricular size is normal. Right vetricular wall thickness was not well visualized. Right ventricular systolic function is normal. Tricuspid regurgitation signal is inadequate for assessing PA pressure. The tricuspid regurgitant velocity is 1.72 m/s, and with an assumed right atrial pressure of 3 mmHg, the estimated right ventricular systolic pressure is 09.3 mmHg. Left Atrium: Left atrial size was normal in size. Right Atrium: Right atrial size was normal in size. Pericardium: There is no evidence of pericardial effusion. Mitral Valve: The mitral valve is grossly normal. Trivial mitral valve regurgitation. No evidence of mitral valve stenosis. Tricuspid Valve: The tricuspid valve is grossly normal. Tricuspid valve regurgitation is trivial. Aortic Valve: The aortic valve is abnormal. There is mild calcification of the aortic valve. There is mild thickening of the aortic valve. Aortic valve regurgitation is not  visualized. Aortic valve sclerosis is present, with no evidence of aortic valve stenosis. Aortic valve mean gradient measures 3.0 mmHg. Aortic valve peak gradient measures 6.6 mmHg. Aortic valve area, by VTI measures 2.20 cm. Pulmonic Valve: The pulmonic valve was not well visualized. Pulmonic valve regurgitation is not visualized. Aorta: The aortic root is normal in size and structure. Venous: The inferior vena cava is normal in size with greater than 50% respiratory variability, suggesting right atrial pressure of 3 mmHg. IAS/Shunts: The atrial septum is grossly normal.  LEFT VENTRICLE PLAX 2D LVIDd:         3.90 cm     Diastology LVIDs:         2.50 cm     LV e' medial:    4.03 cm/s LV PW:         1.00 cm     LV E/e' medial:  9.0 LV IVS:        1.00 cm     LV e' lateral:   6.20 cm/s LVOT diam:     2.00 cm     LV E/e' lateral: 5.9 LV SV:         55 LV SV Index:   30 LVOT Area:     3.14 cm  LV Volumes (MOD) LV vol d, MOD A2C: 73.9 ml LV vol d, MOD A4C: 53.2 ml LV vol s, MOD A2C: 35.0 ml LV vol s, MOD A4C: 31.9 ml LV SV MOD A2C:     38.9 ml LV SV MOD A4C:     53.2 ml LV SV MOD BP:      27.3 ml RIGHT VENTRICLE RV Basal diam:  4.50 cm RV Mid diam:    3.50 cm RV S prime:     13.60 cm/s TAPSE (M-mode): 2.6 cm LEFT ATRIUM           Index        RIGHT ATRIUM          Index LA diam:      2.80 cm 1.53 cm/m   RA Area:  9.45 cm LA Vol (A2C): 26.2 ml 14.29 ml/m  RA Volume:   20.00 ml 10.91 ml/m LA Vol (A4C): 44.6 ml 24.32 ml/m  AORTIC VALVE                    PULMONIC VALVE AV Area (Vmax):    2.06 cm     PV Vmax:       0.96 m/s AV Area (Vmean):   2.06 cm     PV Peak grad:  3.7 mmHg AV Area (VTI):     2.20 cm AV Vmax:           128.00 cm/s AV Vmean:          84.200 cm/s AV VTI:            0.251 m AV Peak Grad:      6.6 mmHg AV Mean Grad:      3.0 mmHg LVOT Vmax:         84.00 cm/s LVOT Vmean:        55.300 cm/s LVOT VTI:          0.176 m LVOT/AV VTI ratio: 0.70  AORTA Ao Root diam: 3.70 cm Ao Asc diam:  3.30 cm  MITRAL VALVE               TRICUSPID VALVE MV Area (PHT): 3.27 cm    TR Peak grad:   11.8 mmHg MV Decel Time: 232 msec    TR Vmax:        172.00 cm/s MR Peak grad: 23.6 mmHg MR Vmax:      242.67 cm/s  SHUNTS MV E velocity: 36.40 cm/s  Systemic VTI:  0.18 m MV A velocity: 82.70 cm/s  Systemic Diam: 2.00 cm MV E/A ratio:  0.44 Cherlynn Kaiser MD Electronically signed by Cherlynn Kaiser MD Signature Date/Time: 08/18/2022/2:37:48 PM    Final    CT Angio Chest/Abd/Pel for Dissection W and/or Wo Contrast  Result Date: 08/18/2022 CLINICAL DATA:  Left back pain radiating to abdomen EXAM: CT ANGIOGRAPHY CHEST, ABDOMEN AND PELVIS TECHNIQUE: Non-contrast CT of the chest was initially obtained. Multidetector CT imaging through the chest, abdomen and pelvis was performed using the standard protocol during bolus administration of intravenous contrast. Multiplanar reconstructed images and MIPs were obtained and reviewed to evaluate the vascular anatomy. RADIATION DOSE REDUCTION: This exam was performed according to the departmental dose-optimization program which includes automated exposure control, adjustment of the mA and/or kV according to patient size and/or use of iterative reconstruction technique. CONTRAST:  19m OMNIPAQUE IOHEXOL 350 MG/ML SOLN COMPARISON:  CTA chest dated 10/28/2010 FINDINGS: CTA CHEST FINDINGS Cardiovascular: Unenhanced CT, there is no evidence of intrarenal hematoma. Following contrast administration, there is no evidence of thoracic aortic aneurysm or dissection. Atherosclerotic calcifications. Although not tailored for evaluation of the pulmonary arteries, there is a lobar pulmonary embolism in the left lower lobe (series 7/image 36). Clot burden is moderate. Elevated RV to LV ratio (1.1), raising the possibility of right heart strain. The heart is normal in size.  No pericardial effusion. Three vessel coronary sclerosis. Mediastinum/Nodes: No suspicious mediastinal lymphadenopathy. Enlarged  thyroid with multiple dominant left thyroid nodules measuring up to 2.9 cm (series 7/image 1). These have been previously characterized on multiple thyroid ultrasounds. This has been evaluated on previous imaging. (ref: J Am Coll Radiol. 2015 Feb;12(2): 143-50). Lungs/Pleura: Mild eventration left hemidiaphragm with associated left basilar atelectasis. Additional mild dependent atelectasis in the posterior right upper and lower lobes. No  focal consolidation. No suspicious pulmonary nodules. Calcified pleural plaques. No pleural effusion or pneumothorax. Musculoskeletal: Degenerative changes of the thoracic spine. Review of the MIP images confirms the above findings. CTA ABDOMEN AND PELVIS FINDINGS VASCULAR Aorta: No evidence abdominal aortic aneurysm or dissection. Patent. Atherosclerotic calcifications. Celiac: Patent.  Atherosclerotic calcifications at the origin. SMA: Patent.  Atherosclerotic calcifications at the origin. Renals: Patent. Atherosclerotic calcifications at the origin bilaterally. IMA: Patent. Inflow: Patent bilaterally.  Atherosclerotic calcifications. Veins: Grossly unremarkable. Review of the MIP images confirms the above findings. NON-VASCULAR Motion degraded images. Hepatobiliary: Liver is poorly evaluated due to motion degradation. However, there are at least 3 hypoenhancing lesions suspected, measuring up to 1.9 cm inferiorly in segment 6 (series 7/image 93), incompletely characterized. Gallbladder is unremarkable. No intrahepatic or extrahepatic duct dilatation. Pancreas: 5.5 x 5.4 cm hypoenhancing mass in the pancreatic tail (series 7/image 78), poorly evaluated. Although benign etiologies (complex cystic lesion or pseudocyst) remain possible, a primary pancreatic neoplasm is considered the diagnosis of exclusion. Spleen: Within normal limits. Adrenals/Urinary Tract: Adrenal glands are within normal limits. Bilateral renal cysts, measuring up to 4.7 cm in the right upper kidney (series  7/image 36), measuring simple fluid density, benign (Bosniak I). No hydronephrosis. Bladder is within normal limits. Stomach/Bowel: Stomach is within normal limits. No evidence of bowel obstruction. Normal appendix (series 7/image 128). Scattered colonic diverticulosis, without evidence of diverticulitis. Lymphatic: No suspicious abdominopelvic lymphadenopathy. Reproductive: Marked prostatomegaly, suggesting BPH. Other: No abdominopelvic ascites. Tiny fat containing left inguinal hernia. Small right inguinal hernia containing loops of nondilated small bowel. Musculoskeletal: Mild degenerative changes of the lumbar spine. Review of the MIP images confirms the above findings. IMPRESSION: No evidence of thoracoabdominal aortic aneurysm or dissection. Left lower lobe lobar pulmonary embolism. Clot burden is moderate. Elevated RV to LV ratio, raising the possibility of right heart strain. 5.4 x 5.4 cm hypoenhancing mass in the pancreatic tail, poorly evaluated. While benign etiologies remain possible, a primary pancreatic neoplasm is considered the diagnosis of exclusion. Follow-up pancreatic protocol CT or MR (with/without contrast) can be considered for further characterization as an outpatient, if warranted at this patient's age. Given the motion degradation on the current study, CT may be better than MR. Suspected hepatic lesions, poorly visualized/evaluated. If the patient has a primary pancreatic malignancy, these would be worrisome for metastases. These could be assessed at the time of follow-up. Additional ancillary findings as above. These results were called by telephone at the time of interpretation on 08/18/2022 at 2:26 am to provider Kirstie Mirza, who verbally acknowledged these results. Electronically Signed   By: Julian Hy M.D.   On: 08/18/2022 02:27    Assessment and Plan:   This is a 88 year old male patient who presented to the ER with chief complaint of back pain lower extremity pain, weight  loss found to have acute PE as well as a pancreatic tail mass with possibly hepatic metastatic disease, medical oncology consulted for recommendations. Patient also has underlying cardiomyopathy, congestive heart failure with ejection fraction of 40%, cardiology following. Today, I discussed with the patient regarding imaging findings including pancreatic mass as well as liver lesions We discussed that this is highly concerning for pancreatic cancer and liver metastases. He understands that treatment is likely palliative in intent if this was confirmed. He said he is 25, not in his best shape, is mostly sedentary and he doesn't want to pursue any treatment. We then discussed about palliative care and hospice. He is familiar with this and told me  several times that he wants to be comfortable and not suffer during this. Patient didn't even think biopsy is needed since we may not treat it I then called the son who is in agreement Ok to continue Grenelefe for anticoagulation, if he does have episodes of hematuria or any other bleeding, he may want to stop.  I conveyed this message to Dr Tawanna Solo  The length of time of the face-to-face encounter was 75 minutes. More than 50% of time was spent counseling and coordination of care.  Thank you for this referral.

## 2022-08-21 NOTE — Plan of Care (Signed)
?  Problem: Education: ?Goal: Knowledge of General Education information will improve ?Description: Including pain rating scale, medication(s)/side effects and non-pharmacologic comfort measures ?Outcome: Progressing ?  ?Problem: Health Behavior/Discharge Planning: ?Goal: Ability to manage health-related needs will improve ?Outcome: Progressing ?  ?Problem: Coping: ?Goal: Level of anxiety will decrease ?Outcome: Progressing ?  ?

## 2022-08-21 NOTE — Progress Notes (Addendum)
ANTICOAGULATION CONSULT NOTE   Pharmacy Consult for IV heparin Indication: PE/DVT  No Known Allergies  Patient Measurements: Height: 6' (182.9 cm) Weight: 72 kg (158 lb 11.7 oz) IBW/kg (Calculated) : 77.6 Heparin Dosing Weight: total body weight   Vital Signs: Temp: 97.9 F (36.6 C) (10/08 0421) BP: 135/74 (10/08 0421) Pulse Rate: 65 (10/08 0421)  Labs: Recent Labs    08/19/22 0000 08/19/22 0537 08/19/22 0823 08/19/22 0910 08/19/22 1834 08/20/22 0515 08/21/22 0525  HGB 10.5*  --   --   --   --  10.2* 10.2*  HCT 30.3*  --   --   --   --  29.1* 29.2*  PLT 137*  --   --   --   --  138* 140*  HEPARINUNFRC  --    < >  --    < > 0.47 0.49 0.40  CREATININE  --   --  1.16  --   --  1.23 1.36*   < > = values in this interval not displayed.     Estimated Creatinine Clearance: 32.4 mL/min (A) (by C-G formula based on SCr of 1.36 mg/dL (H)).  Assessment: 88 year old male presenting to the ED with back pain. Woke up this morning, pain radiating down left leg. Soon after developed chest and abdominal pain, diaphoresis, and feeling "unsettled". CT reveals left lobar PE with concern for heart strain. Bilateral lower extremity venous duplex + for LLE DVT. Patient had been on warfarin previously, then on Xarelto which was discontinued about a month ago due to hematuria. Pharmacy consulted for IV heparin dosing.   Baseline labs: INR 1.1, Hgb 11.5, Pltc 149K  Today, 08/21/22: Heparin level = 0.4 units/mL, remains therapeutic on heparin infusion at 850 units/hr CBC: Hgb 10.2, low but relatively stable. Pltc low but stable at 140K No active bleeding or infusion issues noted per RN Per RN, scant amount of dried blood at IV site  Goal of Therapy:  Heparin level 0.3-0.7 units/ml Monitor platelets by anticoagulation protocol: Yes   Plan:  Continue heparin infusion at 850 units/hr Daily CBC (monitor Pltc closely), heparin level Monitor closely for s/sx of bleeding F/u for transition  to oral anticoagulation   Lindell Spar, PharmD, BCPS Clinical Pharmacist  08/21/2022, 8:22 AM   Addendum: Asked to transition to Apixaban  Plan: At 2000, stop heparin infusion At the same time, start Apixaban '10mg'$  PO BID x 7 days, then '5mg'$  PO BID Discussed with nursing   Lindell Spar, PharmD, BCPS Clinical Pharmacist 08/21/2022 3:54 PM

## 2022-08-21 NOTE — Plan of Care (Signed)
  Problem: Health Behavior/Discharge Planning: Goal: Ability to manage health-related needs will improve Outcome: Progressing   Problem: Coping: Goal: Level of anxiety will decrease Outcome: Progressing   

## 2022-08-21 NOTE — Progress Notes (Signed)
PROGRESS NOTE Peter Hall  FWY:[id number] DOB: 09/22/1924 DOA: 08/17/2022 PCP: Anda Kraft, MD   Brief Narrative/Hospital Course: 88 year old male with history of anal warts, anal carcinoma in situ, chronic pain syndrome/DDD of lumbosacral spine/back surgery/postlaminectomy syndrome, hypertension, gout, lower extremity edema, unspecified ulcer, history of hypercoagulable state previously on warfarin, then on Xarelto which was discontinued about a month ago due to hematuria presented with back pain lower extremity pain and also sweating, feeling anxious, has lost about 20 pounds in a month.   Seen in the ED vitals were stable, labs with normal UA hemoglobin 9.5 normal total which count platelet count two-point negative x2 lipase 56 > 49, CMP fairly stable with low magnesium, CT chest abdomen pelvis done showed left lower lobe pulm embolism with moderate clot burden and CT evidence of right heart strain, 5.4X 5.4 cm hypoenhancing mass in the pancreatic tail poorly evaluated, proximal bili primary pancreatic neoplasm, suspected hepatic lesion worrisome for metastasis and patient was admitted .   Subjective: Seen and examined this morning.  Resting comfortably at the bedside chair and having his meal Overnight afebrile Creatinine trending up 1.3 hemoglobin stable 10.2   Assessment and Plan: Principal Problem:   Acute pulmonary embolism (HCC) Active Problems:   Essential hypertension   Gout   Normocytic anemia   Mild protein malnutrition (HCC)   Hypomagnesemia   Pancreatic mass   Thrombocytopenia (HCC)   Left popliteal DVT (deep venous thrombosis) (HCC)   Acute systolic HF (heart failure) (HCC)   Acute left lower lobe PE with right heart strain Acute left posterior tibial vein DVT Hypercoagulable state with history of being on warfarin and switched to Xarelto: Patient having thromboembolic phenomena in the setting of discontinuation of Xarelto due to hematuria.  Echo done with EF 50% RV  size normal with normal function. Dr Chryl Heck spoke to him and no plans for biopsy no further workup-and he wants to enroll in hospice.  I discussed with him he agrees with hospice TOC consulted, also discussed CODE STATUS with him-changed to DNR  Cardiomyopathy/acute systolic dysfunction with EF 40%, cardiology following, compensated unable to titrate medication due to low heart rate/BP, continue low-dose Coreg with holding parameters, discontinued ARB cardiology managing input Essential hypertension : BP labile soft monitor discontinue amlodipine/ARB. On low dose coreg  Primary pancreatic neoplasm w/ liver metastasis per CT: CT pancreas shows 5.5 cm enhancing mass, multifocal hepatic metastasis up to 2.2 cm.  Consulted Dr. Chryl Heck for further inputs.  History of hematuria for which he was taken off Xarelto.  Continue to monitor closely while on anticoagulation.  AKI creatinine trending up 1.3 start gentle therapy with hydration minimize hypotension Recent Labs  Lab 08/17/22 2213 08/18/22 0500 08/19/22 0823 08/20/22 0515 08/21/22 0525  BUN 30* 25* 23 29* 34*  CREATININE 1.14 1.04 1.16 1.23 1.36*    Normocytic anemia anemia of chronic disease hemoglobin evaluated and remains.  Monitor  Thrombo-cytopenia monitor Hypomagnesemia repleted Gout continue allopurinol and colchicine Mild protein malnutrition dietitian consulted-augment nutritional status Nutrition Status:         DVT prophylaxis:   Heparin drip Code Status:   Code Status: Full Code Family Communication: plan of care discussed with patient at bedside, called and updated his son 10/6 Discussed oncology and they have updated patient's son. Patient status is: Inpatient due to PE Level of care: Telemetry  Dispo: The patient is from: home            Anticipated disposition: home [place]  Mobility Assessment (last 72 hours)     Mobility Assessment     Row Name 08/21/22 0800 08/20/22 0830 08/19/22 0800 08/18/22 2045      Does patient have an order for bedrest or is patient medically unstable No - Continue assessment No - Continue assessment No - Continue assessment No - Continue assessment    What is the highest level of mobility based on the progressive mobility assessment? Level 5 (Walks with assist in room/hall) - Balance while stepping forward/back and can walk in room with assist - Complete Level 4 (Walks with assist in room) - Balance while marching in place and cannot step forward and back - Complete Level 4 (Walks with assist in room) - Balance while marching in place and cannot step forward and back - Complete Level 3 (Stands with assist) - Balance while standing  and cannot march in place    Is the above level different from baseline mobility prior to current illness? Yes - Recommend PT order Yes - Recommend PT order Yes - Recommend PT order Yes - Recommend PT order              Objective: Vitals last 24 hrs: Vitals:   08/20/22 1306 08/20/22 2111 08/21/22 0421 08/21/22 1253  BP: 111/60 (!) 107/52 135/74 116/72  Pulse: 65 69 65 80  Resp: '18 18 17 18  '$ Temp: 98.5 F (36.9 C) 98.3 F (36.8 C) 97.9 F (36.6 C) 97.9 F (36.6 C)  TempSrc: Oral   Oral  SpO2: 100% 100% 99% 100%  Weight:      Height:       Weight change:   Physical Examination: General exam: alert awake, elderly, pleasant older than stated age HEENT:Oral mucosa moist, Ear/Nose WNL grossly Respiratory system: Bilaterally clear BS, no use of accessory muscle Cardiovascular system: S1 & S2 +, No JVD. Gastrointestinal system: Abdomen soft,NT,ND, BS+ Nervous System: Alert, awake, moving extremities, follows commands. Extremities: LE edema neg,distal peripheral pulses palpable.  Skin: No rashes,no icterus. MSK: Normal muscle bulk,tone, power   Medications reviewed:  Scheduled Meds:  allopurinol  100 mg Oral Daily   carvedilol  3.125 mg Oral BID WC   colchicine  0.3 mg Oral QODAY   tamsulosin  0.4 mg Oral Daily   Continuous Infusions:  sodium chloride 75 mL/hr at 08/21/22 1000   heparin 850 Units/hr (08/21/22 0957)    Diet Order             Diet Heart Room service appropriate? Yes; Fluid consistency: Thin  Diet effective now                  Intake/Output Summary (Last 24 hours) at 08/21/2022 1434 Last data filed at 08/21/2022 1254 Gross per 24 hour  Intake 624.06 ml  Output 1350 ml  Net -725.94 ml   Net IO Since Admission: -922.39 mL [08/21/22 1434]  Wt Readings from Last 3 Encounters:  08/20/22 72 kg  01/02/12 94.4 kg  09/19/11 91.3 kg     Unresulted Labs (From admission, onward)     Start     Ordered   08/20/22 5102  Basic metabolic panel  Daily at 5am,   R     Question:  Specimen collection method  Answer:  Lab=Lab collect   08/19/22 1007   08/20/22 0500  Heparin level (unfractionated)  Daily at 5am,   R     Question:  Specimen collection method  Answer:  Lab=Lab collect   08/19/22 1925  08/19/22 0500  CBC  Daily at 5am,   R     Question:  Specimen collection method  Answer:  IV Team=IV Team collect   08/18/22 0324          Data Reviewed: I have personally reviewed following labs and imaging studies CBC: Recent Labs  Lab 08/17/22 2146 08/19/22 0000 08/20/22 0515 08/21/22 0525  WBC 9.2 6.5 7.0 6.6  HGB 11.5* 10.5* 10.2* 10.2*  HCT 33.8* 30.3* 29.1* 29.2*  MCV 90.6 89.1 89.3 90.4  PLT 149* 137* 138* 660*   Basic Metabolic Panel: Recent Labs  Lab 08/17/22 2213 08/18/22 0500 08/19/22 0823 08/20/22 0515 08/21/22 0525  NA 144 142 139 139 140  K 4.4 4.4 4.4 4.1 4.7  CL 108 109 106 108 109  CO2 '27 25 27 26 26  '$ GLUCOSE 89 98 86 89 84  BUN 30* 25* 23 29* 34*  CREATININE 1.14 1.04 1.16 1.23 1.36*  CALCIUM 9.5 9.1 9.0 8.3* 8.6*  MG  --  1.6*  --   --   --    GFR: Estimated Creatinine Clearance: 32.4 mL/min (A) (by C-G formula based on SCr of 1.36 mg/dL (H)). Liver Function Tests: Recent Labs  Lab 08/17/22 2213 08/18/22 0500  AST 18 30  ALT 12 12   ALKPHOS 60 70  BILITOT 0.9 1.1  PROT 6.7 6.3*  ALBUMIN 3.7 3.4*   Recent Labs  Lab 08/17/22 2213 08/18/22 0500  LIPASE 56* 49  No results found for this or any previous visit (from the past 240 hour(s)).  Antimicrobials: Anti-infectives (From admission, onward)    None      Culture/Microbiology    Component Value Date/Time   SDES URINE, CLEAN CATCH 07/21/2009 1905   SPECREQUEST NONE 07/21/2009 1905   CULT NO GROWTH 07/21/2009 1905   REPTSTATUS 07/23/2009 FINAL 07/21/2009 1905    Other culture-see note  Radiology Studies: CT PANCREAS ABD W/WO  Result Date: 08/20/2022 CLINICAL DATA:  Pancreatic mass EXAM: CT ABDOMEN WITHOUT AND WITH CONTRAST TECHNIQUE: Multidetector CT imaging of the abdomen was performed following the standard protocol before and following the bolus administration of intravenous contrast. RADIATION DOSE REDUCTION: This exam was performed according to the departmental dose-optimization program which includes automated exposure control, adjustment of the mA and/or kV according to patient size and/or use of iterative reconstruction technique. CONTRAST:  180m OMNIPAQUE IOHEXOL 300 MG/ML  SOLN COMPARISON:  CT abdomen/pelvis dated 08/18/2022 FINDINGS: Lower chest: Left lower lobe opacity and trace bilateral pleural effusions, unchanged. Hepatobiliary: Multifocal hepatic metastases, approximately 10 in number, measuring up to 2.2 cm in the posterior right hepatic dome (series 9/image 22). Gallbladder is unremarkable. No intrahepatic or extrahepatic duct dilatation. Pancreas: 5.5 x 4.6 cm enhancing mass along the pancreatic tail (series 9/image 39), compatible with primary pancreatic neoplasm. Given the location, there is no associated vascular invasion. Spleen: Within normal limits. Adrenals/Urinary Tract: Adrenal glands are within normal limits. Multiple bilateral renal cysts, some of which are hyperdense/hemorrhagic, unchanged from recent CT, benign (Bosniak I-II). No  follow-up is recommended. No hydronephrosis. Stomach/Bowel: Stomach is within normal limits. Visualized bowel is unremarkable, including a normal appendix (series 9/image 86). Vascular/Lymphatic: No evidence of abdominal aortic aneurysm. Atherosclerotic calcifications of the abdominal aorta and branch vessels. No suspicious abdominal lymphadenopathy. Other: No abdominal ascites. Musculoskeletal: Degenerative changes of the visualized thoracolumbar spine. IMPRESSION: 5.5 cm enhancing mass along the pancreatic tail, compatible with primary pancreatic neoplasm. No associated vascular invasion. Multifocal hepatic metastases, measuring up to 2.2 cm  in the posterior right hepatic dome. Otherwise unchanged from recent study. Electronically Signed   By: Julian Hy M.D.   On: 08/20/2022 20:12     LOS: 2 days   Antonieta Pert, MD [facility]  08/21/2022, 2:34 PM

## 2022-08-21 NOTE — Progress Notes (Signed)
Progress Note  Patient Name: Peter Hall Date of Encounter: 08/21/2022  Primary Cardiologist:   None   Subjective   No chest pain.   Denies any SOB.  Reports ambulated in the hallway.   Inpatient Medications    Scheduled Meds:  allopurinol  100 mg Oral Daily   colchicine  0.6 mg Oral QODAY   irbesartan  300 mg Oral Daily   metoprolol succinate  25 mg Oral Daily   tamsulosin  0.4 mg Oral Daily   Continuous Infusions:  heparin 850 Units/hr (08/20/22 0342)   PRN Meds: acetaminophen **OR** acetaminophen, fentaNYL (SUBLIMAZE) injection, naLOXone (NARCAN)  injection, ondansetron (ZOFRAN) IV   Vital Signs    Vitals:   08/20/22 1112 08/20/22 1306 08/20/22 2111 08/21/22 0421  BP: (!) 98/51 111/60 (!) 107/52 135/74  Pulse: 67 65 69 65  Resp:  '18 18 17  '$ Temp:  98.5 F (36.9 C) 98.3 F (36.8 C) 97.9 F (36.6 C)  TempSrc:  Oral    SpO2:  100% 100% 99%  Weight:      Height:        Intake/Output Summary (Last 24 hours) at 08/21/2022 0805 Last data filed at 08/21/2022 0700 Gross per 24 hour  Intake 864.06 ml  Output 1300 ml  Net -435.94 ml   Filed Weights   08/18/22 2038 08/19/22 0518 08/20/22 0500  Weight: 70.7 kg 72.3 kg 72 kg    Telemetry    NSR with ectopy - Personally Reviewed  ECG    NA - Personally Reviewed  Physical Exam   GEN: No  acute distress.   Neck: No  JVD Cardiac: RRR, 2/6 apical brief systolic murmur, no diastolic murmurs, rubs, or gallops.  Respiratory: Clear   to auscultation bilaterally. GI: Soft, nontender, non-distended, normal bowel sounds  MS:  No edema; No deformity. Neuro:   Nonfocal  Psych: Oriented and appropriate    Labs    Chemistry Recent Labs  Lab 08/17/22 2213 08/18/22 0500 08/19/22 0823 08/20/22 0515 08/21/22 0525  NA 144 142 139 139 140  K 4.4 4.4 4.4 4.1 4.7  CL 108 109 106 108 109  CO2 '27 25 27 26 26  '$ GLUCOSE 89 98 86 89 84  BUN 30* 25* 23 29* 34*  CREATININE 1.14 1.04 1.16 1.23 1.36*  CALCIUM 9.5  9.1 9.0 8.3* 8.6*  PROT 6.7 6.3*  --   --   --   ALBUMIN 3.7 3.4*  --   --   --   AST 18 30  --   --   --   ALT 12 12  --   --   --   ALKPHOS 60 70  --   --   --   BILITOT 0.9 1.1  --   --   --   GFRNONAA 59* >60 58* 54* 48*  ANIONGAP '9 8 6 5 5     '$ Hematology Recent Labs  Lab 08/19/22 0000 08/20/22 0515 08/21/22 0525  WBC 6.5 7.0 6.6  RBC 3.40* 3.26* 3.23*  HGB 10.5* 10.2* 10.2*  HCT 30.3* 29.1* 29.2*  MCV 89.1 89.3 90.4  MCH 30.9 31.3 31.6  MCHC 34.7 35.1 34.9  RDW 14.9 14.7 15.3  PLT 137* 138* 140*    Cardiac EnzymesNo results for input(s): "TROPONINI" in the last 168 hours. No results for input(s): "TROPIPOC" in the last 168 hours.   BNP Recent Labs  Lab 08/19/22 0823  BNP 104.5*     DDimer No  results for input(s): "DDIMER" in the last 168 hours.   Radiology    CT PANCREAS ABD W/WO  Result Date: 08/20/2022 CLINICAL DATA:  Pancreatic mass EXAM: CT ABDOMEN WITHOUT AND WITH CONTRAST TECHNIQUE: Multidetector CT imaging of the abdomen was performed following the standard protocol before and following the bolus administration of intravenous contrast. RADIATION DOSE REDUCTION: This exam was performed according to the departmental dose-optimization program which includes automated exposure control, adjustment of the mA and/or kV according to patient size and/or use of iterative reconstruction technique. CONTRAST:  141m OMNIPAQUE IOHEXOL 300 MG/ML  SOLN COMPARISON:  CT abdomen/pelvis dated 08/18/2022 FINDINGS: Lower chest: Left lower lobe opacity and trace bilateral pleural effusions, unchanged. Hepatobiliary: Multifocal hepatic metastases, approximately 10 in number, measuring up to 2.2 cm in the posterior right hepatic dome (series 9/image 22). Gallbladder is unremarkable. No intrahepatic or extrahepatic duct dilatation. Pancreas: 5.5 x 4.6 cm enhancing mass along the pancreatic tail (series 9/image 39), compatible with primary pancreatic neoplasm. Given the location, there is  no associated vascular invasion. Spleen: Within normal limits. Adrenals/Urinary Tract: Adrenal glands are within normal limits. Multiple bilateral renal cysts, some of which are hyperdense/hemorrhagic, unchanged from recent CT, benign (Bosniak I-II). No follow-up is recommended. No hydronephrosis. Stomach/Bowel: Stomach is within normal limits. Visualized bowel is unremarkable, including a normal appendix (series 9/image 86). Vascular/Lymphatic: No evidence of abdominal aortic aneurysm. Atherosclerotic calcifications of the abdominal aorta and branch vessels. No suspicious abdominal lymphadenopathy. Other: No abdominal ascites. Musculoskeletal: Degenerative changes of the visualized thoracolumbar spine. IMPRESSION: 5.5 cm enhancing mass along the pancreatic tail, compatible with primary pancreatic neoplasm. No associated vascular invasion. Multifocal hepatic metastases, measuring up to 2.2 cm in the posterior right hepatic dome. Otherwise unchanged from recent study. Electronically Signed   By: SJulian HyM.D.   On: 08/20/2022 20:12    Cardiac Studies   ECHO   1. Left ventricular ejection fraction, by estimation, is 40%. The left  ventricle has mildly decreased function. The left ventricle demonstrates  global hypokinesis. Left ventricular diastolic parameters are  indeterminate.   2. Right ventricular systolic function is normal. The right ventricular  size is normal. Tricuspid regurgitation signal is inadequate for assessing  PA pressure.   3. The mitral valve is grossly normal. Trivial mitral valve  regurgitation. No evidence of mitral stenosis.   4. The aortic valve is abnormal. There is mild calcification of the  aortic valve. There is mild thickening of the aortic valve. Aortic valve  regurgitation is not visualized. Aortic valve sclerosis is present, with  no evidence of aortic valve stenosis.   5. The inferior vena cava is normal in size with greater than 50%  respiratory  variability, suggesting right atrial pressure of 3 mmHg.   Patient Profile     88y.o. male with a hx of anal carcinoma, chronic pain, HTN, gout, lower extremity edema, ulcer, ?clotting disorder who is being seen 08/19/2022 for the evaluation of LV dysfunction at the request of Dr. 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   Resume DOAC per primary team.   Likely transition to today.    Cardiomyopathy:  EF is 40%.  Intake and out put is incomplete.    Unable to titrate any meds secondary to low HR and BP.  Avapro and beta blocker held yesterday.   BP is very labile.  I might  suggest we sta off of the ARB/ARNi and only use a low dose of Coreg if he tolerates.  Seems to be euvolemic.  No other change in meds.    Essential HTN:  Running low as noted.   Meds held as above.   CKD IIIA:   Creat is creeping up.  Not on any diuretic.  Follow after discharge.   First degree AV block:  No significant arrhythmias.     For questions or updates, please contact [facility] Please consult [email or web address] for contact info under Cardiology/STEMI.   Signed, Minus Breeding, MD  08/21/2022, 8:05 AM

## 2022-08-21 NOTE — Progress Notes (Signed)
Pt had 10 beat run of v-tach at 1745 this shift. Pt asymptomatic throughout qand in no acute distress. MD notified

## 2022-08-21 NOTE — Discharge Instructions (Signed)
Information on my medicine - ELIQUIS (apixaban)  Why was Eliquis prescribed for you? Eliquis was prescribed to treat blood clots that may have been found in the veins of your legs (deep vein thrombosis) or in your lungs (pulmonary embolism) and to reduce the risk of them occurring again.  What do You need to know about Eliquis ? The starting dose is 10 mg (two 5 mg tablets) taken TWICE daily for the FIRST SEVEN (7) DAYS, then on 08/28/22 the dose is reduced to ONE 5 mg tablet taken TWICE daily.  Eliquis may be taken with or without food.   Try to take the dose about the same time in the morning and in the evening. If you have difficulty swallowing the tablet whole please discuss with your pharmacist how to take the medication safely.  Take Eliquis exactly as prescribed and DO NOT stop taking Eliquis without talking to the doctor who prescribed the medication.  Stopping may increase your risk of developing a new blood clot.  Refill your prescription before you run out.  After discharge, you should have regular check-up appointments with your healthcare provider that is prescribing your Eliquis.    What do you do if you miss a dose? If a dose of ELIQUIS is not taken at the scheduled time, take it as soon as possible on the same day and twice-daily administration should be resumed. The dose should not be doubled to make up for a missed dose.  Important Safety Information A possible side effect of Eliquis is bleeding. You should call your healthcare provider right away if you experience any of the following: Bleeding from an injury or your nose that does not stop. Unusual colored urine (red or dark brown) or unusual colored stools (red or black). Unusual bruising for unknown reasons. A serious fall or if you hit your head (even if there is no bleeding).  Some medicines may interact with Eliquis and might increase your risk of bleeding or clotting while on Eliquis. To help avoid this,  consult your healthcare provider or pharmacist prior to using any new prescription or non-prescription medications, including herbals, vitamins, non-steroidal anti-inflammatory drugs (NSAIDs) and supplements.  This website has more information on Eliquis (apixaban): http://www.eliquis.com/eliquis/home

## 2022-08-22 ENCOUNTER — Other Ambulatory Visit (HOSPITAL_COMMUNITY): Payer: Self-pay

## 2022-08-22 DIAGNOSIS — I5021 Acute systolic (congestive) heart failure: Secondary | ICD-10-CM | POA: Diagnosis not present

## 2022-08-22 DIAGNOSIS — I2699 Other pulmonary embolism without acute cor pulmonale: Secondary | ICD-10-CM | POA: Diagnosis not present

## 2022-08-22 LAB — CBC
HCT: 28.5 % — ABNORMAL LOW (ref 39.0–52.0)
Hemoglobin: 9.9 g/dL — ABNORMAL LOW (ref 13.0–17.0)
MCH: 31.3 pg (ref 26.0–34.0)
MCHC: 34.7 g/dL (ref 30.0–36.0)
MCV: 90.2 fL (ref 80.0–100.0)
Platelets: 142 10*3/uL — ABNORMAL LOW (ref 150–400)
RBC: 3.16 MIL/uL — ABNORMAL LOW (ref 4.22–5.81)
RDW: 15.2 % (ref 11.5–15.5)
WBC: 6.5 10*3/uL (ref 4.0–10.5)
nRBC: 0 % (ref 0.0–0.2)

## 2022-08-22 LAB — BASIC METABOLIC PANEL
Anion gap: 3 — ABNORMAL LOW (ref 5–15)
BUN: 35 mg/dL — ABNORMAL HIGH (ref 8–23)
CO2: 25 mmol/L (ref 22–32)
Calcium: 8.3 mg/dL — ABNORMAL LOW (ref 8.9–10.3)
Chloride: 112 mmol/L — ABNORMAL HIGH (ref 98–111)
Creatinine, Ser: 1.43 mg/dL — ABNORMAL HIGH (ref 0.61–1.24)
GFR, Estimated: 45 mL/min — ABNORMAL LOW (ref >60)
Glucose, Bld: 88 mg/dL (ref 70–99)
Potassium: 4.8 mmol/L (ref 3.5–5.1)
Sodium: 140 mmol/L (ref 135–145)

## 2022-08-22 MED ORDER — COLCHICINE 0.6 MG PO TABS
0.3000 mg | ORAL_TABLET | ORAL | 0 refills | Status: AC
  Filled 2022-08-22: qty 8, 30d supply, fill #0

## 2022-08-22 MED ORDER — APIXABAN (ELIQUIS) VTE STARTER PACK (10MG AND 5MG)
ORAL_TABLET | ORAL | 0 refills | Status: AC
  Filled 2022-08-22: qty 74, 30d supply, fill #0

## 2022-08-22 MED ORDER — CARVEDILOL 3.125 MG PO TABS
3.1250 mg | ORAL_TABLET | Freq: Two times a day (BID) | ORAL | 0 refills | Status: AC
  Filled 2022-08-22: qty 60, 30d supply, fill #0

## 2022-08-22 NOTE — Progress Notes (Signed)
Mobility Specialist - Progress Note   08/22/22 1649  Mobility  Activity Ambulated with assistance to bathroom  Level of Assistance Standby assist, set-up cues, supervision of patient - no hands on  Assistive Device Front wheel walker  Distance Ambulated (ft) 20 ft  Range of Motion/Exercises Active  Activity Response Tolerated well  $Mobility charge 1 Mobility   Pt was found on recliner chair and agreeable to ambulate to bathroom. At EOS returned to recliner chair with all necessities in reach.  Ferd Hibbs Mobility Specialist

## 2022-08-22 NOTE — Discharge Summary (Signed)
Physician Discharge Summary  VINCEN BEJAR QBV:[id number] DOB: 02-15-1924 DOA: 08/17/2022  PCP: Anda Kraft, MD  Admit date: 08/17/2022 Discharge date: 08/22/2022 Recommendations for Outpatient Follow-up:  Follow up with PCP/hospice team  Discharge Dispo: home w/ hospice Discharge Condition: Stable Code Status:   Code Status: DNR Diet recommendation:  Diet Order             Diet Heart Room service appropriate? Yes; Fluid consistency: Thin  Diet effective now                  Brief/Interim Summary:88 year old male with history of anal warts, anal carcinoma in situ, chronic pain syndrome/DDD of lumbosacral spine/back surgery/postlaminectomy syndrome, hypertension, gout, lower extremity edema, unspecified ulcer, history of hypercoagulable state previously on warfarin, then on Xarelto which was discontinued about a month ago due to hematuria presented with back pain lower extremity pain and also sweating, feeling anxious, has lost about 20 pounds in a month.   Seen in the ED vitals were stable, labs with normal UA hemoglobin 9.5 normal total which count platelet count two-point negative x2 lipase 56 > 49, CMP fairly stable with low magnesium, CT chest abdomen pelvis done showed left lower lobe pulm embolism with moderate clot burden and CT evidence of right heart strain, 5.4X 5.4 cm hypoenhancing mass in the pancreatic tail poorly evaluated, proximal bili primary pancreatic neoplasm, suspected hepatic lesion worrisome for metastasis and patient was admitted . Managed with anticoagulation heparin, echo showed cardiomyopathy with EF 40% seen by cardiology May-Suggested.  Underwent CT [facility] that showed finding concerning for primary pancreatic neoplasm with liver metastasis, seen by oncology and after further discussion patient did not opt to go for biopsy and wants to go home with hospice.  TOC consulted. I discussed POC with pat's son and family and agree with home with hospice  Discharge  Diagnoses:  Principal Problem:   Acute pulmonary embolism ([place]) Active Problems:   Essential hypertension   Gout   Normocytic anemia   Mild protein malnutrition (HCC)   Hypomagnesemia   Pancreatic mass   Thrombocytopenia (HCC)   Left popliteal DVT (deep venous thrombosis) (HCC)   Acute systolic HF (heart failure) (HCC)  Acute left lower lobe PE with right heart strain Acute left posterior tibial vein DVT Hypercoagulable state with history of being on warfarin and switched to Xarelto: Patient having thromboembolic phenomena in the setting of discontinuation of Xarelto due to hematuria. Echo done with EF 50% RV size normal with normal function.  Heparin converted to Eliquis and tolerating so far.  Goals of Care:Dr Iruku spoke to him and no plans for biopsy no further workup-and he wants to enroll in hospice.  I discussed with him he agrees with hospice TOC consulted, also discussed CODE STATUS with him-changed to DNR   Cardiomyopathy/acute systolic dysfunction with EF 40%, cardiology following, compensated unable to titrate medication due to low heart rate/BP, continue low-dose Coreg with holding parameters, discontinued ARB cardiology managing input Essential hypertension : BP labile soft monitor discontinued amlodipine/ARB. On low dose coreg   Primary pancreatic neoplasm w/ liver metastasis per CT: CT pancreas shows 5.5 cm enhancing mass, multifocal hepatic metastasis up to 2.2 cm.  Consulted Dr. Chryl Heck for further inputs-Dr Iruku spoke to him and no plans for biopsy no further workup-and he wants to enroll in hospice.   History of hematuria for which he was taken off Xarelto.  Continue to monitor closely while on anticoagulation.  If he has ongoing hematuria that cannot be  managed, he can discuss with the hospice team to discontinue his anticoagulation.   AKI creatinine holding at 1.2-1.4 range.  Encourage oral intake Normocytic anemia anemia of chronic disease hemoglobin stable   Thrombo-cytopenia monitor Hypomagnesemia repleted Gout continue allopurinol and colchicine Mild protein malnutrition dietitian consulted-augment nutritional status  Consults: Cardio, hemonc Subjective: Aaox3, feels ready to go home with hospice, no bleeding or hematuria  Discharge Exam: Vitals:   08/22/22 0920 08/22/22 1251  BP: (!) 148/74 (!) 116/57  Pulse: 64 63  Resp: 18 18  Temp: 98.3 F (36.8 C) 98 F (36.7 C)  SpO2: 98% 100%   General: Pt is alert, awake, not in acute distress Cardiovascular: RRR, S1/S2 +, no rubs, no gallops Respiratory: CTA bilaterally, no wheezing, no rhonchi Abdominal: Soft, NT, ND, bowel sounds + Extremities: no edema, no cyanosis  Discharge Instructions  Discharge Instructions     Discharge instructions   Complete by: As directed    Follow up with hospice team   Increase activity slowly   Complete by: As directed       Allergies as of 08/22/2022   No Known Allergies      Medication List     STOP taking these medications    amLODipine-valsartan 5-320 MG tablet Commonly known as: EXFORGE   warfarin 5 MG tablet Commonly known as: COUMADIN   Xarelto 20 MG Tabs tablet Generic drug: rivaroxaban       TAKE these medications    allopurinol 100 MG tablet Commonly known as: ZYLOPRIM Take 100 mg by mouth daily.   Apixaban Starter Pack ('10mg'$  and '5mg'$ ) Commonly known as: ELIQUIS STARTER PACK Take as directed on package: start with two-'5mg'$  tablets twice daily for 6 days, started first dose on 08/21/22. After 6 days change to '5mg'$  tablet twice daily.   carvedilol 3.125 MG tablet Commonly known as: COREG Take 1 tablet (3.125 mg total) by mouth 2 (two) times daily with a meal.   colchicine 0.6 MG tablet Take 0.5 tablets (0.3 mg total) by mouth every other day. Start taking on: August 23, 2022 What changed: how much to take   tamsulosin 0.4 MG Caps capsule Commonly known as: FLOMAX Take 0.4 mg by mouth daily.   VITAMIN D  PO Take 1 tablet by mouth daily.        Follow-up Information     Charlie Pitter, PA-C Follow up.   Specialties: Cardiology, Radiology Why: [place] office - a cardiology follow-up has been arranged for you on Thursday September 15, 2022 at 10:55 AM (Arrive by 10:40 AM). Contact information: [address] [place] [address] [phone]         Anda Kraft, MD Follow up in 1 week(s).   Specialty: Endocrinology Contact information: [address] [place] [address] [phone]                No Known Allergies  The results of significant diagnostics from this hospitalization (including imaging, microbiology, ancillary and laboratory) are listed below for reference.    Microbiology: No results found for this or any previous visit (from the past 240 hour(s)).  Procedures/Studies: CT PANCREAS ABD W/WO  Result Date: 08/20/2022 CLINICAL DATA:  Pancreatic mass EXAM: CT ABDOMEN WITHOUT AND WITH CONTRAST TECHNIQUE: Multidetector CT imaging of the abdomen was performed following the standard protocol before and following the bolus administration of intravenous contrast. RADIATION DOSE REDUCTION: This exam was performed according to the departmental dose-optimization program  which includes automated exposure control, adjustment of the mA and/or kV according to patient size and/or use of iterative reconstruction technique. CONTRAST:  126m OMNIPAQUE IOHEXOL 300 MG/ML  SOLN COMPARISON:  CT abdomen/pelvis dated 08/18/2022 FINDINGS: Lower chest: Left lower lobe opacity and trace bilateral pleural effusions, unchanged. Hepatobiliary: Multifocal hepatic metastases, approximately 10 in number, measuring up to 2.2 cm in the posterior right hepatic dome (series 9/image 22). Gallbladder is unremarkable. No intrahepatic or extrahepatic duct dilatation. Pancreas: 5.5 x 4.6 cm enhancing mass along the pancreatic tail (series 9/image  39), compatible with primary pancreatic neoplasm. Given the location, there is no associated vascular invasion. Spleen: Within normal limits. Adrenals/Urinary Tract: Adrenal glands are within normal limits. Multiple bilateral renal cysts, some of which are hyperdense/hemorrhagic, unchanged from recent CT, benign (Bosniak I-II). No follow-up is recommended. No hydronephrosis. Stomach/Bowel: Stomach is within normal limits. Visualized bowel is unremarkable, including a normal appendix (series 9/image 86). Vascular/Lymphatic: No evidence of abdominal aortic aneurysm. Atherosclerotic calcifications of the abdominal aorta and branch vessels. No suspicious abdominal lymphadenopathy. Other: No abdominal ascites. Musculoskeletal: Degenerative changes of the visualized thoracolumbar spine. IMPRESSION: 5.5 cm enhancing mass along the pancreatic tail, compatible with primary pancreatic neoplasm. No associated vascular invasion. Multifocal hepatic metastases, measuring up to 2.2 cm in the posterior right hepatic dome. Otherwise unchanged from recent study. Electronically Signed   By: SJulian HyM.D.   On: 08/20/2022 20:12   VAS [place]OWER EXTREMITY VENOUS (DVT)  Result Date: 08/18/2022  Lower Venous DVT Study Patient Name:  FARAVIND CHRISMER Date of Exam:   08/18/2022 Medical Rec #: [id number]      Accession #:    [id number] of Birth: [birth year]       Patient Gender: M Patient Age:   89years [facility]      VAS [place]OWER EXTREMITY VENOUS (DVT) Referring Phys: DAVID ORTIZ --------------------------------------------------------------------------------  Indications: Pulmonary embolism.  Risk Factors: Confirmed PE. Comparison Study: No prior studies. Performing Technologist: GOliver HumRVT  Examination Guidelines: A complete evaluation includes B-mode imaging, spectral Doppler, color Doppler, and power Doppler as needed of all accessible portions of each vessel. Bilateral testing  is considered an integral part of a complete examination. Limited examinations for reoccurring indications may be performed as noted. The reflux portion of the exam is performed with the patient in reverse Trendelenburg.  +---------+---------------+---------+-----------+----------+--------------+ RIGHT    CompressibilityPhasicitySpontaneityPropertiesThrombus Aging +---------+---------------+---------+-----------+----------+--------------+ CFV      Full           Yes      Yes                                 +---------+---------------+---------+-----------+----------+--------------+ SFJ      Full                                                        +---------+---------------+---------+-----------+----------+--------------+ FV Prox  Full                                                        +---------+---------------+---------+-----------+----------+--------------+  FV Mid   Full                                                        +---------+---------------+---------+-----------+----------+--------------+ FV DistalFull                                                        +---------+---------------+---------+-----------+----------+--------------+ PFV      Full                                                        +---------+---------------+---------+-----------+----------+--------------+ POP      Full           Yes      Yes                                 +---------+---------------+---------+-----------+----------+--------------+ PTV      Full                                                        +---------+---------------+---------+-----------+----------+--------------+ PERO     Full                                                        +---------+---------------+---------+-----------+----------+--------------+   +---------+---------------+---------+-----------+----------+--------------+ LEFT      CompressibilityPhasicitySpontaneityPropertiesThrombus Aging +---------+---------------+---------+-----------+----------+--------------+ CFV      Full           Yes      Yes                                 +---------+---------------+---------+-----------+----------+--------------+ SFJ      Full                                                        +---------+---------------+---------+-----------+----------+--------------+ FV Prox  Full                                                        +---------+---------------+---------+-----------+----------+--------------+ FV Mid                  Yes      Yes                                 +---------+---------------+---------+-----------+----------+--------------+  FV Distal               Yes      Yes                                 +---------+---------------+---------+-----------+----------+--------------+ PFV      Full                                                        +---------+---------------+---------+-----------+----------+--------------+ POP      Full           Yes      Yes                                 +---------+---------------+---------+-----------+----------+--------------+ PTV      None                                         Acute          +---------+---------------+---------+-----------+----------+--------------+ PERO     Full                                                        +---------+---------------+---------+-----------+----------+--------------+     Summary: RIGHT: - There is no evidence of deep vein thrombosis in the lower extremity.  - No cystic structure found in the popliteal fossa.  LEFT: - Findings consistent with acute deep vein thrombosis involving the left posterior tibial veins. - No cystic structure found in the popliteal fossa.  *See table(s) above for measurements and observations. Electronically signed by Jamelle Haring on 08/18/2022 at 4:37:41 PM.    Final     ECHOCARDIOGRAM COMPLETE  Result Date: 08/18/2022    ECHOCARDIOGRAM REPORT   Patient Name:   BHAVIN MONJARAZ Date of Exam: 08/18/2022 Medical Rec #:  [id number]      Height:       68.0 in Accession #:    [id number]     Weight:       155.0 lb Date of Birth:  [birth year]       BSA:          1.834 m Patient Age:    54 years       BP:           148/79 mmHg Patient Gender: M              HR:           70 bpm. Exam Location:  Inpatient Procedure: 2D Echo Indications:    pulmonary embolus  History:        Patient has no prior history of Echocardiogram examinations.  Sonographer:    Harvie Junior Referring Phys: [id number] Rhetta Mura  Sonographer Comments: Image acquisition challenging due to respiratory motion and supine. IMPRESSIONS  1. Left ventricular ejection fraction, by estimation, is 40%. The left ventricle has mildly decreased function. The left ventricle demonstrates global hypokinesis. Left ventricular diastolic parameters are  indeterminate.  2. Right ventricular systolic function is normal. The right ventricular size is normal. Tricuspid regurgitation signal is inadequate for assessing PA pressure.  3. The mitral valve is grossly normal. Trivial mitral valve regurgitation. No evidence of mitral stenosis.  4. The aortic valve is abnormal. There is mild calcification of the aortic valve. There is mild thickening of the aortic valve. Aortic valve regurgitation is not visualized. Aortic valve sclerosis is present, with no evidence of aortic valve stenosis.  5. The inferior vena cava is normal in size with greater than 50% respiratory variability, suggesting right atrial pressure of 3 mmHg. FINDINGS  Left Ventricle: Left ventricular ejection fraction, by estimation, is 40%. The left ventricle has mildly decreased function. The left ventricle demonstrates global hypokinesis. The left ventricular internal cavity size was normal in size. There is borderline left ventricular hypertrophy. Left ventricular diastolic  parameters are indeterminate. Right Ventricle: The right ventricular size is normal. Right vetricular wall thickness was not well visualized. Right ventricular systolic function is normal. Tricuspid regurgitation signal is inadequate for assessing PA pressure. The tricuspid regurgitant velocity is 1.72 m/s, and with an assumed right atrial pressure of 3 mmHg, the estimated right ventricular systolic pressure is 78.4 mmHg. Left Atrium: Left atrial size was normal in size. Right Atrium: Right atrial size was normal in size. Pericardium: There is no evidence of pericardial effusion. Mitral Valve: The mitral valve is grossly normal. Trivial mitral valve regurgitation. No evidence of mitral valve stenosis. Tricuspid Valve: The tricuspid valve is grossly normal. Tricuspid valve regurgitation is trivial. Aortic Valve: The aortic valve is abnormal. There is mild calcification of the aortic valve. There is mild thickening of the aortic valve. Aortic valve regurgitation is not visualized. Aortic valve sclerosis is present, with no evidence of aortic valve stenosis. Aortic valve mean gradient measures 3.0 mmHg. Aortic valve peak gradient measures 6.6 mmHg. Aortic valve area, by VTI measures 2.20 cm. Pulmonic Valve: The pulmonic valve was not well visualized. Pulmonic valve regurgitation is not visualized. Aorta: The aortic root is normal in size and structure. Venous: The inferior vena cava is normal in size with greater than 50% respiratory variability, suggesting right atrial pressure of 3 mmHg. IAS/Shunts: The atrial septum is grossly normal.  LEFT VENTRICLE PLAX 2D LVIDd:         3.90 cm     Diastology LVIDs:         2.50 cm     LV e' medial:    4.03 cm/s LV PW:         1.00 cm     LV E/e' medial:  9.0 LV IVS:        1.00 cm     LV e' lateral:   6.20 cm/s LVOT diam:     2.00 cm     LV E/e' lateral: 5.9 LV SV:         55 LV SV Index:   30 LVOT Area:     3.14 cm  LV Volumes (MOD) LV vol d, MOD A2C: 73.9 ml LV vol d, MOD  A4C: 53.2 ml LV vol s, MOD A2C: 35.0 ml LV vol s, MOD A4C: 31.9 ml LV SV MOD A2C:     38.9 ml LV SV MOD A4C:     53.2 ml LV SV MOD BP:      27.3 ml RIGHT VENTRICLE RV Basal diam:  4.50 cm RV Mid diam:    3.50 cm RV S prime:     13.60  cm/s TAPSE (M-mode): 2.6 cm LEFT ATRIUM           Index        RIGHT ATRIUM          Index LA diam:      2.80 cm 1.53 cm/m   RA Area:     9.45 cm LA Vol (A2C): 26.2 ml 14.29 ml/m  RA Volume:   20.00 ml 10.91 ml/m LA Vol (A4C): 44.6 ml 24.32 ml/m  AORTIC VALVE                    PULMONIC VALVE AV Area (Vmax):    2.06 cm     PV Vmax:       0.96 m/s AV Area (Vmean):   2.06 cm     PV Peak grad:  3.7 mmHg AV Area (VTI):     2.20 cm AV Vmax:           128.00 cm/s AV Vmean:          84.200 cm/s AV VTI:            0.251 m AV Peak Grad:      6.6 mmHg AV Mean Grad:      3.0 mmHg LVOT Vmax:         84.00 cm/s LVOT Vmean:        55.300 cm/s LVOT VTI:          0.176 m LVOT/AV VTI ratio: 0.70  AORTA Ao Root diam: 3.70 cm Ao Asc diam:  3.30 cm MITRAL VALVE               TRICUSPID VALVE MV Area (PHT): 3.27 cm    TR Peak grad:   11.8 mmHg MV Decel Time: 232 msec    TR Vmax:        172.00 cm/s MR Peak grad: 23.6 mmHg MR Vmax:      242.67 cm/s  SHUNTS MV E velocity: 36.40 cm/s  Systemic VTI:  0.18 m MV A velocity: 82.70 cm/s  Systemic Diam: 2.00 cm MV E/A ratio:  0.44 Cherlynn Kaiser MD Electronically signed by Cherlynn Kaiser MD Signature Date/Time: 08/18/2022/2:37:48 PM    Final    CT Angio Chest/Abd/Pel for Dissection W and/or Wo Contrast  Result Date: 08/18/2022 CLINICAL DATA:  Left back pain radiating to abdomen EXAM: CT ANGIOGRAPHY CHEST, ABDOMEN AND PELVIS TECHNIQUE: Non-contrast CT of the chest was initially obtained. Multidetector CT imaging through the chest, abdomen and pelvis was performed using the standard protocol during bolus administration of intravenous contrast. Multiplanar reconstructed images and MIPs were obtained and reviewed to evaluate the vascular anatomy. RADIATION  DOSE REDUCTION: This exam was performed according to the departmental dose-optimization program which includes automated exposure control, adjustment of the mA and/or kV according to patient size and/or use of iterative reconstruction technique. CONTRAST:  163m OMNIPAQUE IOHEXOL 350 MG/ML SOLN COMPARISON:  CTA chest dated 10/28/2010 FINDINGS: CTA CHEST FINDINGS Cardiovascular: Unenhanced CT, there is no evidence of intrarenal hematoma. Following contrast administration, there is no evidence of thoracic aortic aneurysm or dissection. Atherosclerotic calcifications. Although not tailored for evaluation of the pulmonary arteries, there is a lobar pulmonary embolism in the left lower lobe (series 7/image 36). Clot burden is moderate. Elevated RV to LV ratio (1.1), raising the possibility of right heart strain. The heart is normal in size.  No pericardial effusion. Three vessel coronary sclerosis. Mediastinum/Nodes: No suspicious mediastinal lymphadenopathy. Enlarged thyroid with multiple dominant left thyroid nodules  measuring up to 2.9 cm (series 7/image 1). These have been previously characterized on multiple thyroid ultrasounds. This has been evaluated on previous imaging. (ref: J Am Coll Radiol. 2015 Feb;12(2): 143-50). Lungs/Pleura: Mild eventration left hemidiaphragm with associated left basilar atelectasis. Additional mild dependent atelectasis in the posterior right upper and lower lobes. No focal consolidation. No suspicious pulmonary nodules. Calcified pleural plaques. No pleural effusion or pneumothorax. Musculoskeletal: Degenerative changes of the thoracic spine. Review of the MIP images confirms the above findings. CTA ABDOMEN AND PELVIS FINDINGS VASCULAR Aorta: No evidence abdominal aortic aneurysm or dissection. Patent. Atherosclerotic calcifications. Celiac: Patent.  Atherosclerotic calcifications at the origin. SMA: Patent.  Atherosclerotic calcifications at the origin. Renals: Patent. Atherosclerotic  calcifications at the origin bilaterally. IMA: Patent. Inflow: Patent bilaterally.  Atherosclerotic calcifications. Veins: Grossly unremarkable. Review of the MIP images confirms the above findings. NON-VASCULAR Motion degraded images. Hepatobiliary: Liver is poorly evaluated due to motion degradation. However, there are at least 3 hypoenhancing lesions suspected, measuring up to 1.9 cm inferiorly in segment 6 (series 7/image 93), incompletely characterized. Gallbladder is unremarkable. No intrahepatic or extrahepatic duct dilatation. Pancreas: 5.5 x 5.4 cm hypoenhancing mass in the pancreatic tail (series 7/image 78), poorly evaluated. Although benign etiologies (complex cystic lesion or pseudocyst) remain possible, a primary pancreatic neoplasm is considered the diagnosis of exclusion. Spleen: Within normal limits. Adrenals/Urinary Tract: Adrenal glands are within normal limits. Bilateral renal cysts, measuring up to 4.7 cm in the right upper kidney (series 7/image 36), measuring simple fluid density, benign (Bosniak I). No hydronephrosis. Bladder is within normal limits. Stomach/Bowel: Stomach is within normal limits. No evidence of bowel obstruction. Normal appendix (series 7/image 128). Scattered colonic diverticulosis, without evidence of diverticulitis. Lymphatic: No suspicious abdominopelvic lymphadenopathy. Reproductive: Marked prostatomegaly, suggesting BPH. Other: No abdominopelvic ascites. Tiny fat containing left inguinal hernia. Small right inguinal hernia containing loops of nondilated small bowel. Musculoskeletal: Mild degenerative changes of the lumbar spine. Review of the MIP images confirms the above findings. IMPRESSION: No evidence of thoracoabdominal aortic aneurysm or dissection. Left lower lobe lobar pulmonary embolism. Clot burden is moderate. Elevated RV to LV ratio, raising the possibility of right heart strain. 5.4 x 5.4 cm hypoenhancing mass in the pancreatic tail, poorly evaluated.  While benign etiologies remain possible, a primary pancreatic neoplasm is considered the diagnosis of exclusion. Follow-up pancreatic protocol CT or MR (with/without contrast) can be considered for further characterization as an outpatient, if warranted at this patient's age. Given the motion degradation on the current study, CT may be better than MR. Suspected hepatic lesions, poorly visualized/evaluated. If the patient has a primary pancreatic malignancy, these would be worrisome for metastases. These could be assessed at the time of follow-up. Additional ancillary findings as above. These results were called by telephone at the time of interpretation on 08/18/2022 at 2:26 am to provider Kirstie Mirza, who verbally acknowledged these results. Electronically Signed   By: Julian Hy M.D.   On: 08/18/2022 02:27    Labs: BNP (last 3 results) Recent Labs    08/19/22 0823  BNP 914.7*   Basic Metabolic Panel: Recent Labs  Lab 08/18/22 0500 08/19/22 0823 08/20/22 0515 08/21/22 0525 08/22/22 0424  NA 142 139 139 140 140  K 4.4 4.4 4.1 4.7 4.8  CL 109 106 108 109 112*  CO2 '25 27 26 26 25  '$ GLUCOSE 98 86 89 84 88  BUN 25* 23 29* 34* 35*  CREATININE 1.04 1.16 1.23 1.36* 1.43*  CALCIUM 9.1 9.0 8.3* 8.6*  8.3*  MG 1.6*  --   --   --   --    Liver Function Tests: Recent Labs  Lab 08/17/22 2213 08/18/22 0500  AST 18 30  ALT 12 12  ALKPHOS 60 70  BILITOT 0.9 1.1  PROT 6.7 6.3*  ALBUMIN 3.7 3.4*   Recent Labs  Lab 08/17/22 2213 08/18/22 0500  LIPASE 56* 49   No results for input(s): "AMMONIA" in the last 168 hours. CBC: Recent Labs  Lab 08/17/22 2146 08/19/22 0000 08/20/22 0515 08/21/22 0525 08/22/22 0424  WBC 9.2 6.5 7.0 6.6 6.5  HGB 11.5* 10.5* 10.2* 10.2* 9.9*  HCT 33.8* 30.3* 29.1* 29.2* 28.5*  MCV 90.6 89.1 89.3 90.4 90.2  PLT 149* 137* 138* 140* 142*   Cardiac Enzymes: No results for input(s): "CKTOTAL", "CKMB", "CKMBINDEX", "TROPONINI" in the last 168  hours. BNP: Invalid input(s): "POCBNP" CBG: Recent Labs  Lab 08/17/22 2228  GLUCAP 79   D-Dimer No results for input(s): "DDIMER" in the last 72 hours. Hgb A1c No results for input(s): "HGBA1C" in the last 72 hours. Lipid Profile No results for input(s): "CHOL", "HDL", "LDLCALC", "TRIG", "CHOLHDL", "LDLDIRECT" in the last 72 hours. Thyroid function studies No results for input(s): "TSH", "T4TOTAL", "T3FREE", "THYROIDAB" in the last 72 hours.  Invalid input(s): "FREET3" Anemia work up No results for input(s): "VITAMINB12", "FOLATE", "FERRITIN", "TIBC", "IRON", "RETICCTPCT" in the last 72 hours. Urinalysis    Component Value Date/Time   COLORURINE YELLOW 08/17/2022 2307   APPEARANCEUR CLEAR 08/17/2022 2307   LABSPEC 1.019 08/17/2022 2307   PHURINE 5.0 08/17/2022 2307   GLUCOSEU NEGATIVE 08/17/2022 2307   HGBUR NEGATIVE 08/17/2022 2307   BILIRUBINUR NEGATIVE 08/17/2022 2307   KETONESUR NEGATIVE 08/17/2022 2307   PROTEINUR NEGATIVE 08/17/2022 2307   UROBILINOGEN 0.2 07/21/2009 1906   NITRITE NEGATIVE 08/17/2022 2307   LEUKOCYTESUR NEGATIVE 08/17/2022 2307   Sepsis Labs Recent Labs  Lab 08/19/22 0000 08/20/22 0515 08/21/22 0525 08/22/22 0424  WBC 6.5 7.0 6.6 6.5   Microbiology No results found for this or any previous visit (from the past 240 hour(s)).   Time coordinating discharge: 35 minutes  SIGNED: Antonieta Pert, MD  [facility] 08/22/2022, 1:48 PM  If 7PM-7AM, please contact night-coverage [email or web address]

## 2022-08-22 NOTE — Progress Notes (Addendum)
Peter Hall [address] [facility])  Hospital Liaison RN note  Received request from [facility] for hospice services at home after discharge. Chart and patient information under review by Hospice physician.   Spoke with son Peter Hall and DIL Peter Hall to initiate education related to hospice philosophy, services, and team approach to care. Patient/family verbalized understanding of information given.  Per discussion, the plan is for discharge home by car likely today or tomorrow.  DME needs discussed. Patient has the following equipment in the home. Walker and elevated commode seat. Patient/family requests the following equipment for deliver: N/A.   Please send signed and completed DNR with patient/family. Please provide symptoms at discharge as needed for ongoing symptom management.   [facility] information and contact numbers given to son Peter Hall. Above information shared with Peter Hall.   Please call with any hospice related questions or concerns.  Thank you for the opportunity to participate in this patient's care.  Jhonnie Garner, Therapist, sports, BSN [employer] [phone]

## 2022-08-22 NOTE — Progress Notes (Signed)
Rounding Note    Patient Name: Peter Hall Date of Encounter: 08/22/2022  [place] Cardiologist: None   Subjective   Denies any CP or SOB.   Inpatient Medications    Scheduled Meds:  allopurinol  100 mg Oral Daily   apixaban  10 mg Oral BID   Followed by   Derrill Memo ON 08/28/2022] apixaban  5 mg Oral BID   carvedilol  3.125 mg Oral BID WC   colchicine  0.3 mg Oral QODAY   tamsulosin  0.4 mg Oral Daily   Continuous Infusions:  sodium chloride 75 mL/hr at 08/22/22 0050   PRN Meds: acetaminophen **OR** acetaminophen, naLOXone (NARCAN)  injection, ondansetron (ZOFRAN) IV   Vital Signs    Vitals:   08/21/22 1253 08/21/22 2003 08/22/22 0430 08/22/22 0600  BP: 116/72 (!) 149/75 124/66   Pulse: 80 (!) 59 62   Resp: '18 18 18   '$ Temp: 97.9 F (36.6 C) 97.7 F (36.5 C) 98.4 F (36.9 C)   TempSrc: Oral Oral Oral   SpO2: 100% 100% 99%   Weight:    71.5 kg  Height:        Intake/Output Summary (Last 24 hours) at 08/22/2022 0819 Last data filed at 08/22/2022 0600 Gross per 24 hour  Intake 1503.44 ml  Output 950 ml  Net 553.44 ml      08/22/2022    6:00 AM 08/20/2022    5:00 AM 08/19/2022    5:18 AM  Last 3 Weights  Weight (lbs) 157 lb 10.1 oz 158 lb 11.7 oz 159 lb 6.3 oz  Weight (kg) 71.5 kg 72 kg 72.3 kg      Telemetry    NSR with occasional wenckebach AV block overnight. Singel 9 beats run of NSVT around 5:44PM on 10/8 - Personally Reviewed  ECG    N/A - Personally Reviewed  Physical Exam   GEN: No acute distress.   Neck: No JVD Cardiac: RRR, no murmurs, rubs, or gallops.  Respiratory: Clear to auscultation bilaterally. GI: Soft, nontender, non-distended  MS: No edema; No deformity. Neuro:  Nonfocal  Psych: Normal affect   Labs    High Sensitivity Troponin:   Recent Labs  Lab 08/17/22 2213 08/18/22 0041  TROPONINIHS 9 12     Chemistry Recent Labs  Lab 08/17/22 2213 08/18/22 0500 08/19/22 0823 08/20/22 0515 08/21/22 0525  08/22/22 0424  NA 144 142   < > 139 140 140  K 4.4 4.4   < > 4.1 4.7 4.8  CL 108 109   < > 108 109 112*  CO2 27 25   < > '26 26 25  '$ GLUCOSE 89 98   < > 89 84 88  BUN 30* 25*   < > 29* 34* 35*  CREATININE 1.14 1.04   < > 1.23 1.36* 1.43*  CALCIUM 9.5 9.1   < > 8.3* 8.6* 8.3*  MG  --  1.6*  --   --   --   --   PROT 6.7 6.3*  --   --   --   --   ALBUMIN 3.7 3.4*  --   --   --   --   AST 18 30  --   --   --   --   ALT 12 12  --   --   --   --   ALKPHOS 60 70  --   --   --   --   BILITOT 0.9 1.1  --   --   --   --  GFRNONAA 59* >60   < > 54* 48* 45*  ANIONGAP 9 8   < > 5 5 3*   < > = values in this interval not displayed.    Lipids No results for input(s): "CHOL", "TRIG", "HDL", "LABVLDL", "LDLCALC", "CHOLHDL" in the last 168 hours.  Hematology Recent Labs  Lab 08/20/22 0515 08/21/22 0525 08/22/22 0424  WBC 7.0 6.6 6.5  RBC 3.26* 3.23* 3.16*  HGB 10.2* 10.2* 9.9*  HCT 29.1* 29.2* 28.5*  MCV 89.3 90.4 90.2  MCH 31.3 31.6 31.3  MCHC 35.1 34.9 34.7  RDW 14.7 15.3 15.2  PLT 138* 140* 142*   Thyroid No results for input(s): "TSH", "FREET4" in the last 168 hours.  BNP Recent Labs  Lab 08/19/22 0823  BNP 104.5*    DDimer No results for input(s): "DDIMER" in the last 168 hours.   Radiology    CT PANCREAS ABD W/WO  Result Date: 08/20/2022 CLINICAL DATA:  Pancreatic mass EXAM: CT ABDOMEN WITHOUT AND WITH CONTRAST TECHNIQUE: Multidetector CT imaging of the abdomen was performed following the standard protocol before and following the bolus administration of intravenous contrast. RADIATION DOSE REDUCTION: This exam was performed according to the departmental dose-optimization program which includes automated exposure control, adjustment of the mA and/or kV according to patient size and/or use of iterative reconstruction technique. CONTRAST:  172m OMNIPAQUE IOHEXOL 300 MG/ML  SOLN COMPARISON:  CT abdomen/pelvis dated 08/18/2022 FINDINGS: Lower chest: Left lower lobe opacity and  trace bilateral pleural effusions, unchanged. Hepatobiliary: Multifocal hepatic metastases, approximately 10 in number, measuring up to 2.2 cm in the posterior right hepatic dome (series 9/image 22). Gallbladder is unremarkable. No intrahepatic or extrahepatic duct dilatation. Pancreas: 5.5 x 4.6 cm enhancing mass along the pancreatic tail (series 9/image 39), compatible with primary pancreatic neoplasm. Given the location, there is no associated vascular invasion. Spleen: Within normal limits. Adrenals/Urinary Tract: Adrenal glands are within normal limits. Multiple bilateral renal cysts, some of which are hyperdense/hemorrhagic, unchanged from recent CT, benign (Bosniak I-II). No follow-up is recommended. No hydronephrosis. Stomach/Bowel: Stomach is within normal limits. Visualized bowel is unremarkable, including a normal appendix (series 9/image 86). Vascular/Lymphatic: No evidence of abdominal aortic aneurysm. Atherosclerotic calcifications of the abdominal aorta and branch vessels. No suspicious abdominal lymphadenopathy. Other: No abdominal ascites. Musculoskeletal: Degenerative changes of the visualized thoracolumbar spine. IMPRESSION: 5.5 cm enhancing mass along the pancreatic tail, compatible with primary pancreatic neoplasm. No associated vascular invasion. Multifocal hepatic metastases, measuring up to 2.2 cm in the posterior right hepatic dome. Otherwise unchanged from recent study. Electronically Signed   By: SJulian HyM.D.   On: 08/20/2022 20:12    Cardiac Studies   ECHO    1. Left ventricular ejection fraction, by estimation, is 40%. The left  ventricle has mildly decreased function. The left ventricle demonstrates  global hypokinesis. Left ventricular diastolic parameters are  indeterminate.   2. Right ventricular systolic function is normal. The right ventricular  size is normal. Tricuspid regurgitation signal is inadequate for assessing  PA pressure.   3. The mitral valve is  grossly normal. Trivial mitral valve  regurgitation. No evidence of mitral stenosis.   4. The aortic valve is abnormal. There is mild calcification of the  aortic valve. There is mild thickening of the aortic valve. Aortic valve  regurgitation is not visualized. Aortic valve sclerosis is present, with  no evidence of aortic valve stenosis.   5. The inferior vena cava is normal in size with greater than 50%  respiratory variability, suggesting right atrial pressure of 3 mmHg.     Patient Profile     88 y.o. male with PMH of anal carcinoma, chronic pain, HTN, gout, LE edema, and history of hypercoagulable stable on Xarelto who presented with chest pain. Work up revealed PE and LLE DVT and pancreatic mass with question of liver mets. Echo showed EF 40%, global hypokinesis, normal RV function. 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   - managed by primary team. Previously had hematuria in the past for which he was taken off of Xarelto. Started on eliquis 10/8.   Chest pain: normal troponin, no further work up  Cardiomyopathy  - Echo EF 40%, global hypokinesis  - unclear etiology, no aggressive work up  - amlodipine stopped, started on low dose metoprolol succinate with irbesartan, both held on 10/7 due to slow HR and low BP. Started coreg at 3.125 mg BID on 10/8  - tolerating low dose coreg, occasional Wenckebach heart block during sleep, asymptomatic. Single 9 beats run of NSVT around 5:44PM on 10/8. Would not increase coreg further. BP and HR stable. Patient has decided not to pursue further evaluation for pancreatic mass. Per oncology note, ok to continue DOAC, may need to stop in the future if develop hematuria again. No further cardiac recommendation.   Pancreatic mass: CT showed 5.4 x 5.4 hypoenhancing mass in pancreatic tail, hepatic lesion worrisome for metastasis.  Oncology consulted on 10/8. Patient understand treatment is likely palliative and opted not to pursue any treatment or biopsy.   HTN  Wenckebach heart block  Single 9 beats run of NSVT  AKI: Cr 1.43, higher than baseline of 1.04, likely seeing the residual effect of hypotension from 2 days ago. Patient is not on any medication that can affect the renal function. Expect Cr plateau and trend down in the next few days.       For questions or updates, please contact [place] Please consult [email or web address] for contact info under        Signed, Almyra Deforest, [place]  08/22/2022, 8:19 AM

## 2022-08-22 NOTE — Care Management Important Message (Signed)
Important Message  Patient Details Hospice Name: Peter Hall MRN: [id number] Date of Birth: 1923-12-24   Medicare Important Message Given:  No     [name] 08/22/2022, 2:18 PM

## 2022-08-22 NOTE — Progress Notes (Signed)
Mobility Specialist - Progress Note   08/22/22 1320  Mobility  Activity Ambulated with assistance in hallway  Level of Assistance Standby assist, set-up cues, supervision of patient - no hands on  Assistive Device Front wheel walker  Distance Ambulated (ft) 350 ft  Range of Motion/Exercises Active  Activity Response Tolerated well   Pt was found in bed and agreeable to ambulate. Had no complaints and at EOS returned to recliner chair with all necessities in reach. RN notified.   Ferd Hibbs Mobility Specialist

## 2022-08-22 NOTE — TOC Progression Note (Signed)
Transition of Care [facility]) - Progression Note    Patient Details  Name: Peter Hall MRN: [id number] Date of Birth: [birth year]  [facility] [place] CM/SW Contact  Peter Mouton, RN Phone Number: 08/22/2022, 12:20 PM  Clinical Narrative:    Spoke with pt concerning Hospice, who agreed with calling his son Peter Hall. Spoke with Peter Hall and his wife concerning hospice. [facility] was selected. Referral was given to Peter Hall. RN with Peter Hall.        Expected Discharge Plan and Services                                                 Social Determinants of Health (SDOH) Interventions    Readmission Risk Interventions     No data to display

## 2022-08-24 DIAGNOSIS — C259 Malignant neoplasm of pancreas, unspecified: Secondary | ICD-10-CM | POA: Diagnosis not present

## 2022-08-24 DIAGNOSIS — G4733 Obstructive sleep apnea (adult) (pediatric): Secondary | ICD-10-CM | POA: Diagnosis not present

## 2022-08-24 DIAGNOSIS — M109 Gout, unspecified: Secondary | ICD-10-CM | POA: Diagnosis not present

## 2022-08-24 DIAGNOSIS — I429 Cardiomyopathy, unspecified: Secondary | ICD-10-CM | POA: Diagnosis not present

## 2022-08-24 DIAGNOSIS — B079 Viral wart, unspecified: Secondary | ICD-10-CM | POA: Diagnosis not present

## 2022-08-24 DIAGNOSIS — C787 Secondary malignant neoplasm of liver and intrahepatic bile duct: Secondary | ICD-10-CM | POA: Diagnosis not present

## 2022-08-24 DIAGNOSIS — I5020 Unspecified systolic (congestive) heart failure: Secondary | ICD-10-CM | POA: Diagnosis not present

## 2022-08-24 DIAGNOSIS — E039 Hypothyroidism, unspecified: Secondary | ICD-10-CM | POA: Diagnosis not present

## 2022-08-24 DIAGNOSIS — I82402 Acute embolism and thrombosis of unspecified deep veins of left lower extremity: Secondary | ICD-10-CM | POA: Diagnosis not present

## 2022-08-24 DIAGNOSIS — N400 Enlarged prostate without lower urinary tract symptoms: Secondary | ICD-10-CM | POA: Diagnosis not present

## 2022-08-24 DIAGNOSIS — D630 Anemia in neoplastic disease: Secondary | ICD-10-CM | POA: Diagnosis not present

## 2022-08-24 DIAGNOSIS — Z6821 Body mass index (BMI) 21.0-21.9, adult: Secondary | ICD-10-CM | POA: Diagnosis not present

## 2022-08-24 DIAGNOSIS — M519 Unspecified thoracic, thoracolumbar and lumbosacral intervertebral disc disorder: Secondary | ICD-10-CM | POA: Diagnosis not present

## 2022-08-24 DIAGNOSIS — I110 Hypertensive heart disease with heart failure: Secondary | ICD-10-CM | POA: Diagnosis not present

## 2022-08-24 DIAGNOSIS — D696 Thrombocytopenia, unspecified: Secondary | ICD-10-CM | POA: Diagnosis not present

## 2022-08-25 DIAGNOSIS — I429 Cardiomyopathy, unspecified: Secondary | ICD-10-CM | POA: Diagnosis not present

## 2022-08-25 DIAGNOSIS — I110 Hypertensive heart disease with heart failure: Secondary | ICD-10-CM | POA: Diagnosis not present

## 2022-08-25 DIAGNOSIS — C787 Secondary malignant neoplasm of liver and intrahepatic bile duct: Secondary | ICD-10-CM | POA: Diagnosis not present

## 2022-08-25 DIAGNOSIS — G4733 Obstructive sleep apnea (adult) (pediatric): Secondary | ICD-10-CM | POA: Diagnosis not present

## 2022-08-25 DIAGNOSIS — C259 Malignant neoplasm of pancreas, unspecified: Secondary | ICD-10-CM | POA: Diagnosis not present

## 2022-08-25 DIAGNOSIS — I5020 Unspecified systolic (congestive) heart failure: Secondary | ICD-10-CM | POA: Diagnosis not present

## 2022-08-30 DIAGNOSIS — C259 Malignant neoplasm of pancreas, unspecified: Secondary | ICD-10-CM | POA: Diagnosis not present

## 2022-08-30 DIAGNOSIS — C787 Secondary malignant neoplasm of liver and intrahepatic bile duct: Secondary | ICD-10-CM | POA: Diagnosis not present

## 2022-08-30 DIAGNOSIS — I429 Cardiomyopathy, unspecified: Secondary | ICD-10-CM | POA: Diagnosis not present

## 2022-08-30 DIAGNOSIS — G4733 Obstructive sleep apnea (adult) (pediatric): Secondary | ICD-10-CM | POA: Diagnosis not present

## 2022-08-30 DIAGNOSIS — I5020 Unspecified systolic (congestive) heart failure: Secondary | ICD-10-CM | POA: Diagnosis not present

## 2022-08-30 DIAGNOSIS — I110 Hypertensive heart disease with heart failure: Secondary | ICD-10-CM | POA: Diagnosis not present

## 2022-09-01 DIAGNOSIS — I5020 Unspecified systolic (congestive) heart failure: Secondary | ICD-10-CM | POA: Diagnosis not present

## 2022-09-01 DIAGNOSIS — C787 Secondary malignant neoplasm of liver and intrahepatic bile duct: Secondary | ICD-10-CM | POA: Diagnosis not present

## 2022-09-01 DIAGNOSIS — C259 Malignant neoplasm of pancreas, unspecified: Secondary | ICD-10-CM | POA: Diagnosis not present

## 2022-09-01 DIAGNOSIS — I429 Cardiomyopathy, unspecified: Secondary | ICD-10-CM | POA: Diagnosis not present

## 2022-09-01 DIAGNOSIS — I110 Hypertensive heart disease with heart failure: Secondary | ICD-10-CM | POA: Diagnosis not present

## 2022-09-01 DIAGNOSIS — G4733 Obstructive sleep apnea (adult) (pediatric): Secondary | ICD-10-CM | POA: Diagnosis not present

## 2022-09-03 DIAGNOSIS — G4733 Obstructive sleep apnea (adult) (pediatric): Secondary | ICD-10-CM | POA: Diagnosis not present

## 2022-09-03 DIAGNOSIS — I429 Cardiomyopathy, unspecified: Secondary | ICD-10-CM | POA: Diagnosis not present

## 2022-09-03 DIAGNOSIS — C259 Malignant neoplasm of pancreas, unspecified: Secondary | ICD-10-CM | POA: Diagnosis not present

## 2022-09-03 DIAGNOSIS — C787 Secondary malignant neoplasm of liver and intrahepatic bile duct: Secondary | ICD-10-CM | POA: Diagnosis not present

## 2022-09-03 DIAGNOSIS — I5020 Unspecified systolic (congestive) heart failure: Secondary | ICD-10-CM | POA: Diagnosis not present

## 2022-09-03 DIAGNOSIS — I110 Hypertensive heart disease with heart failure: Secondary | ICD-10-CM | POA: Diagnosis not present

## 2022-09-05 DIAGNOSIS — I110 Hypertensive heart disease with heart failure: Secondary | ICD-10-CM | POA: Diagnosis not present

## 2022-09-05 DIAGNOSIS — I429 Cardiomyopathy, unspecified: Secondary | ICD-10-CM | POA: Diagnosis not present

## 2022-09-05 DIAGNOSIS — C259 Malignant neoplasm of pancreas, unspecified: Secondary | ICD-10-CM | POA: Diagnosis not present

## 2022-09-05 DIAGNOSIS — I5020 Unspecified systolic (congestive) heart failure: Secondary | ICD-10-CM | POA: Diagnosis not present

## 2022-09-05 DIAGNOSIS — G4733 Obstructive sleep apnea (adult) (pediatric): Secondary | ICD-10-CM | POA: Diagnosis not present

## 2022-09-05 DIAGNOSIS — C787 Secondary malignant neoplasm of liver and intrahepatic bile duct: Secondary | ICD-10-CM | POA: Diagnosis not present

## 2022-09-07 DIAGNOSIS — I429 Cardiomyopathy, unspecified: Secondary | ICD-10-CM | POA: Diagnosis not present

## 2022-09-07 DIAGNOSIS — I5020 Unspecified systolic (congestive) heart failure: Secondary | ICD-10-CM | POA: Diagnosis not present

## 2022-09-07 DIAGNOSIS — I110 Hypertensive heart disease with heart failure: Secondary | ICD-10-CM | POA: Diagnosis not present

## 2022-09-07 DIAGNOSIS — C259 Malignant neoplasm of pancreas, unspecified: Secondary | ICD-10-CM | POA: Diagnosis not present

## 2022-09-07 DIAGNOSIS — G4733 Obstructive sleep apnea (adult) (pediatric): Secondary | ICD-10-CM | POA: Diagnosis not present

## 2022-09-07 DIAGNOSIS — C787 Secondary malignant neoplasm of liver and intrahepatic bile duct: Secondary | ICD-10-CM | POA: Diagnosis not present

## 2022-09-14 ENCOUNTER — Encounter: Payer: Self-pay | Admitting: Physician Assistant

## 2022-09-14 DIAGNOSIS — I110 Hypertensive heart disease with heart failure: Secondary | ICD-10-CM | POA: Diagnosis not present

## 2022-09-14 DIAGNOSIS — I82402 Acute embolism and thrombosis of unspecified deep veins of left lower extremity: Secondary | ICD-10-CM | POA: Diagnosis not present

## 2022-09-14 DIAGNOSIS — M109 Gout, unspecified: Secondary | ICD-10-CM | POA: Diagnosis not present

## 2022-09-14 DIAGNOSIS — Z6821 Body mass index (BMI) 21.0-21.9, adult: Secondary | ICD-10-CM | POA: Diagnosis not present

## 2022-09-14 DIAGNOSIS — C787 Secondary malignant neoplasm of liver and intrahepatic bile duct: Secondary | ICD-10-CM | POA: Diagnosis not present

## 2022-09-14 DIAGNOSIS — E039 Hypothyroidism, unspecified: Secondary | ICD-10-CM | POA: Diagnosis not present

## 2022-09-14 DIAGNOSIS — D630 Anemia in neoplastic disease: Secondary | ICD-10-CM | POA: Diagnosis not present

## 2022-09-14 DIAGNOSIS — I5020 Unspecified systolic (congestive) heart failure: Secondary | ICD-10-CM | POA: Diagnosis not present

## 2022-09-14 DIAGNOSIS — M519 Unspecified thoracic, thoracolumbar and lumbosacral intervertebral disc disorder: Secondary | ICD-10-CM | POA: Diagnosis not present

## 2022-09-14 DIAGNOSIS — I429 Cardiomyopathy, unspecified: Secondary | ICD-10-CM | POA: Diagnosis not present

## 2022-09-14 DIAGNOSIS — N400 Enlarged prostate without lower urinary tract symptoms: Secondary | ICD-10-CM | POA: Diagnosis not present

## 2022-09-14 DIAGNOSIS — C259 Malignant neoplasm of pancreas, unspecified: Secondary | ICD-10-CM | POA: Diagnosis not present

## 2022-09-14 DIAGNOSIS — B079 Viral wart, unspecified: Secondary | ICD-10-CM | POA: Diagnosis not present

## 2022-09-14 DIAGNOSIS — D696 Thrombocytopenia, unspecified: Secondary | ICD-10-CM | POA: Diagnosis not present

## 2022-09-14 DIAGNOSIS — G4733 Obstructive sleep apnea (adult) (pediatric): Secondary | ICD-10-CM | POA: Diagnosis not present

## 2022-09-14 NOTE — Progress Notes (Unsigned)
Cardiology Office Note    Date:  09/15/2022   ID:  MANI CELESTIN, DOB 03/24/1924, MRN [id number]  PCP:  Anda Kraft, MD  Cardiologist:  Kirk Ruths, MD  Electrophysiologist:  None   Chief Complaint: f/u CHF  History of Present Illness:   Peter Hall is a 88 y.o. male with history of anal carcinoma, chronic pain, HTN, gout, lower extremity edema, ulcer, prior hypercoagulable state with prior VTE, then recent admission with recurrent DVT/PE, pancreatic neoplasm with liver mets, hypomagnesemia, LV dysfunction seen for follow-up.  Per notes he has a history of hypercoagulable state previously on warfarin then Xarelto. He recalled having blood clots around the time of an ulcer diagnosis many years ago, otherwise details not totally clear. Blood thinners were recently stopped due to hematuria. However, in 08/2022 he was admitted with worsening back pain, abdominal pain, sweating, anxiousness, poor appetite, and weight loss. Workup has revealed acute LLE PE with possible right heart strain, LLE DVT, anemia (Hgb 11, plt 149), hypomagnesemia, and pancreatic neoplasm with question of liver mets. As part of his workup he had an echo showing EF 40%, global HK, no significant valve abnormality, normal RV function, no prior to compare to. During admission also had Mobitz 1 AVB, asymptomatic. Conservative approach recommended. Patient elected no further workup of his suspected malignancy and wished to enroll in hospice.  He is here for follow-up today accompanied by his son [name]. He denies any new, acute cardiac symptoms since last visit. Appetite remains poor. No recent CP, dyspnea, orthopnea. He has had some issues in the past with incontinence. He has had some hoarseness. He has also has had some various aches and pains relieved with Tylenol. No dizziness or syncope. He is under the care of hospice at home who are regularly coming out.  Labwork independently reviewed: 08/2022 Hgb 9.9,  plt 142, K 4.8, CR 1.43, LFTs ok except albumin 3.4, Mg 1.6   Cardiology Studies:   Studies reviewed are outlined and summarized above. Reports included below if pertinent.   2d echo 08/18/22    1. Left ventricular ejection fraction, by estimation, is 40%. The left  ventricle has mildly decreased function. The left ventricle demonstrates  global hypokinesis. Left ventricular diastolic parameters are  indeterminate.   2. Right ventricular systolic function is normal. The right ventricular  size is normal. Tricuspid regurgitation signal is inadequate for assessing  PA pressure.   3. The mitral valve is grossly normal. Trivial mitral valve  regurgitation. No evidence of mitral stenosis.   4. The aortic valve is abnormal. There is mild calcification of the  aortic valve. There is mild thickening of the aortic valve. Aortic valve  regurgitation is not visualized. Aortic valve sclerosis is present, with  no evidence of aortic valve stenosis.   5. The inferior vena cava is normal in size with greater than 50%  respiratory variability, suggesting right atrial pressure of 3 mmHg.     Past Medical History:  Diagnosis Date   Anal warts    Carcinoma in situ of anal margin    Cardiomyopathy ([place])    Chills    Chronic pain syndrome 10/16/2012   Clotting disorder ([place])    DDD (degenerative disc disease), lumbosacral 04/30/2013   Essential hypertension 08/18/2022   Gout 08/18/2022   Hypertension    Leg swelling    Lumbar post-laminectomy syndrome 10/16/2012   Mobitz type 1 second degree AV block    Pancreatic cancer ([place])  Recurrent deep venous thrombosis ([facility])    Ulcer     Past Surgical History:  Procedure Laterality Date   ANUS SURGERY  08/25/11   fulguration anal warts    BACK SURGERY  06/23/09   HERNIA REPAIR     LAPAROSCOPIC GASTROTOMY W/ REPAIR OF ULCER      Current Medications: Current Meds  Medication Sig   allopurinol (ZYLOPRIM) 100 MG tablet Take 100 mg by mouth  daily.     APIXABAN (ELIQUIS) VTE STARTER PACK ('10MG'$  AND '5MG'$ ) Take as directed on package: start with two-'5mg'$  tablets twice daily for 6 days, started first dose on 08/21/22. After 6 days change to '5mg'$  tablet twice daily.   carvedilol (COREG) 3.125 MG tablet Take 1 tablet (3.125 mg total) by mouth 2 (two) times daily with a meal.   colchicine 0.6 MG tablet Take 1/2 tablet (0.3 mg total) by mouth every other day.   Tamsulosin HCl (FLOMAX) 0.4 MG CAPS Take 0.4 mg by mouth daily.   VITAMIN D PO Take 1 tablet by mouth daily.     Allergies:   Patient has no known allergies.   Social History   Socioeconomic History   Marital status: Married    Spouse name: Not on file   Number of children: Not on file   Years of education: Not on file   Highest education level: Not on file  Occupational History   Not on file  Tobacco Use   Smoking status: Former    Types: Cigarettes    Quit date: 01/01/1982    Years since quitting: 40.7   Smokeless tobacco: Never  Substance and Sexual Activity   Alcohol use: No   Drug use: No   Sexual activity: Not on file  Other Topics Concern   Not on file  Social History Narrative   Not on file   Social Determinants of Health   Financial Resource Strain: Not on file  Food Insecurity: No Food Insecurity (08/18/2022)   Hunger Vital Sign    Worried About Running Out of Food in the Last Year: Never true    Ran Out of Food in the Last Year: Never true  Transportation Needs: No Transportation Needs (08/18/2022)   PRAPARE - Hydrologist (Medical): No    Lack of Transportation (Non-Medical): No  Physical Activity: Not on file  Stress: Not on file  Social Connections: Not on file     Family History:  The patient's family history includes Cancer in his father.  ROS:   Please see the history of present illness.  All other systems are reviewed and otherwise negative.    EKG(s)/Additional Labs   EKG:  EKG is not ordered  today  Recent Labs: 08/18/2022: ALT 12; Magnesium 1.6 08/19/2022: B Natriuretic Peptide 104.5 08/22/2022: BUN 35; Creatinine, Ser 1.43; Hemoglobin 9.9; Platelets 142; Potassium 4.8; Sodium 140  Recent Lipid Panel No results found for: "CHOL", "TRIG", "HDL", "CHOLHDL", "VLDL", "LDLCALC", "LDLDIRECT"  PHYSICAL EXAM:    VS:  BP (!) 140/70 (BP Location: Left Arm, Patient Position: Sitting, Cuff Size: Normal)   Pulse 62   Ht 6' (1.829 m)   Wt 152 lb (68.9 kg)   SpO2 96%   BMI 20.61 kg/m   BMI: Body mass index is 20.61 kg/m.  GEN: Well nourished, well developed male in no acute distress HEENT: normocephalic, atraumatic Neck: no JVD, carotid bruits, or masses Cardiac: RRR; no murmurs, rubs, or gallops, no edema  Respiratory:  clear  to auscultation bilaterally, normal work of breathing GI: soft, nontender, nondistended, + BS MS: no deformity or atrophy Skin: warm and dry, no rash Neuro:  Alert and Oriented x 3, Strength and sensation are intact, follows commands Psych: euthymic mood, full affect  Wt Readings from Last 3 Encounters:  09/15/22 152 lb (68.9 kg)  08/22/22 157 lb 10.1 oz (71.5 kg)  01/02/12 208 lb 3.2 oz (94.4 kg)     ASSESSMENT & PLAN:   1. Cardiomyopathy - etiology/chronicity not clear at time of diagnosis. Ischemic workup deferred given advanced age and newly diagnosed malignancy. Fortunately he does not really have significant symptoms related to this. Volume status looks OK. Continue carvedilol for now but if he develops any symptoms of dizziness or fatigue, it can be stopped for comfort. He does report he urinates very easily, sometimes with incontinence, so I will hold off on prescribing any Lasix to use at home at this time. His volume status can be followed by hospice going forward. Hold off on additional GDMT at this time given recent renal dysfunction and concomitant GOC.  2. Essential HTN - blood pressure acceptable given age, comorbidities. The HTN metric is not  applicable here.  3. Mobitz 1 AVB - asymptomatic. If he develops any symptoms of dizziness, near-syncope or syncope, can stop carvedilol. They will notify for any concerns.   4. Recurrent DVT/PE - management per primary care/hospice.  5. Hypomagnesemia - suspected nutritional. Given renal dysfunction and GOC, would hold off on adding another medicine/supplement to his list at present time.     Disposition: F/u with cardiology PRN. I told them to please reach out if we can be of any assistance.   Medication Adjustments/Labs and Tests Ordered: Current medicines are reviewed at length with the patient today.  Concerns regarding medicines are outlined above. Medication changes, Labs and Tests ordered today are summarized above and listed in the Patient Instructions accessible in Encounters.   Signed, Charlie Pitter, PA-C  09/15/2022 11:21 AM    [place] Phone: [phone]; Fax: [phone]

## 2022-09-15 ENCOUNTER — Ambulatory Visit: Attending: Physician Assistant | Admitting: Physician Assistant

## 2022-09-15 ENCOUNTER — Encounter: Payer: Self-pay | Admitting: Physician Assistant

## 2022-09-15 VITALS — BP 140/70 | HR 62 | Ht 72.0 in | Wt 152.0 lb

## 2022-09-15 DIAGNOSIS — I110 Hypertensive heart disease with heart failure: Secondary | ICD-10-CM | POA: Diagnosis not present

## 2022-09-15 DIAGNOSIS — I10 Essential (primary) hypertension: Secondary | ICD-10-CM | POA: Insufficient documentation

## 2022-09-15 DIAGNOSIS — I441 Atrioventricular block, second degree: Secondary | ICD-10-CM | POA: Diagnosis not present

## 2022-09-15 DIAGNOSIS — I429 Cardiomyopathy, unspecified: Secondary | ICD-10-CM | POA: Insufficient documentation

## 2022-09-15 DIAGNOSIS — C259 Malignant neoplasm of pancreas, unspecified: Secondary | ICD-10-CM | POA: Diagnosis not present

## 2022-09-15 DIAGNOSIS — I82409 Acute embolism and thrombosis of unspecified deep veins of unspecified lower extremity: Secondary | ICD-10-CM | POA: Diagnosis not present

## 2022-09-15 DIAGNOSIS — I5020 Unspecified systolic (congestive) heart failure: Secondary | ICD-10-CM | POA: Diagnosis not present

## 2022-09-15 DIAGNOSIS — G4733 Obstructive sleep apnea (adult) (pediatric): Secondary | ICD-10-CM | POA: Diagnosis not present

## 2022-09-15 DIAGNOSIS — C787 Secondary malignant neoplasm of liver and intrahepatic bile duct: Secondary | ICD-10-CM | POA: Diagnosis not present

## 2022-09-15 NOTE — Patient Instructions (Addendum)
Medication Instructions:  Your physician recommends that you continue on your current medications as directed. Please refer to the Current Medication list given to you today. *If you need a refill on your cardiac medications before your next appointment, please call your pharmacy*   Lab Work: None ordered   Testing/Procedures: None ordered   Follow-Up: At [facility], you and your health needs are our priority.  As part of our continuing mission to provide you with exceptional heart care, we have created designated Provider Care Teams.  These Care Teams include your primary Cardiologist (physician) and Advanced Practice Providers (APPs -  Physician Assistants and Nurse Practitioners) who all work together to provide you with the care you need, when you need it.  We recommend signing up for the patient portal called "MyChart".  Sign up information is provided on this After Visit Summary.  MyChart is used to connect with patients for Virtual Visits (Telemedicine).  Patients are able to view lab/test results, encounter notes, upcoming appointments, etc.  Non-urgent messages can be sent to your provider as well.   To learn more about what you can do with MyChart, go to [email or web address].    Your next appointment:   FOLLOW UP AS NEEDED    Other Instructions   Important Information About Sugar      Please let our office know if you have any questions or concerns going forward. We are here to help in any way that we can.

## 2022-09-22 DIAGNOSIS — I429 Cardiomyopathy, unspecified: Secondary | ICD-10-CM | POA: Diagnosis not present

## 2022-09-22 DIAGNOSIS — I110 Hypertensive heart disease with heart failure: Secondary | ICD-10-CM | POA: Diagnosis not present

## 2022-09-22 DIAGNOSIS — G4733 Obstructive sleep apnea (adult) (pediatric): Secondary | ICD-10-CM | POA: Diagnosis not present

## 2022-09-22 DIAGNOSIS — C787 Secondary malignant neoplasm of liver and intrahepatic bile duct: Secondary | ICD-10-CM | POA: Diagnosis not present

## 2022-09-22 DIAGNOSIS — C259 Malignant neoplasm of pancreas, unspecified: Secondary | ICD-10-CM | POA: Diagnosis not present

## 2022-09-22 DIAGNOSIS — I5020 Unspecified systolic (congestive) heart failure: Secondary | ICD-10-CM | POA: Diagnosis not present

## 2022-09-29 DIAGNOSIS — C259 Malignant neoplasm of pancreas, unspecified: Secondary | ICD-10-CM | POA: Diagnosis not present

## 2022-09-29 DIAGNOSIS — I429 Cardiomyopathy, unspecified: Secondary | ICD-10-CM | POA: Diagnosis not present

## 2022-09-29 DIAGNOSIS — G4733 Obstructive sleep apnea (adult) (pediatric): Secondary | ICD-10-CM | POA: Diagnosis not present

## 2022-09-29 DIAGNOSIS — I110 Hypertensive heart disease with heart failure: Secondary | ICD-10-CM | POA: Diagnosis not present

## 2022-09-29 DIAGNOSIS — C787 Secondary malignant neoplasm of liver and intrahepatic bile duct: Secondary | ICD-10-CM | POA: Diagnosis not present

## 2022-09-29 DIAGNOSIS — I5020 Unspecified systolic (congestive) heart failure: Secondary | ICD-10-CM | POA: Diagnosis not present

## 2022-10-03 DIAGNOSIS — I5020 Unspecified systolic (congestive) heart failure: Secondary | ICD-10-CM | POA: Diagnosis not present

## 2022-10-03 DIAGNOSIS — G4733 Obstructive sleep apnea (adult) (pediatric): Secondary | ICD-10-CM | POA: Diagnosis not present

## 2022-10-03 DIAGNOSIS — I110 Hypertensive heart disease with heart failure: Secondary | ICD-10-CM | POA: Diagnosis not present

## 2022-10-03 DIAGNOSIS — C259 Malignant neoplasm of pancreas, unspecified: Secondary | ICD-10-CM | POA: Diagnosis not present

## 2022-10-03 DIAGNOSIS — I429 Cardiomyopathy, unspecified: Secondary | ICD-10-CM | POA: Diagnosis not present

## 2022-10-03 DIAGNOSIS — C787 Secondary malignant neoplasm of liver and intrahepatic bile duct: Secondary | ICD-10-CM | POA: Diagnosis not present

## 2022-10-07 DIAGNOSIS — I429 Cardiomyopathy, unspecified: Secondary | ICD-10-CM | POA: Diagnosis not present

## 2022-10-07 DIAGNOSIS — I5020 Unspecified systolic (congestive) heart failure: Secondary | ICD-10-CM | POA: Diagnosis not present

## 2022-10-07 DIAGNOSIS — C259 Malignant neoplasm of pancreas, unspecified: Secondary | ICD-10-CM | POA: Diagnosis not present

## 2022-10-07 DIAGNOSIS — G4733 Obstructive sleep apnea (adult) (pediatric): Secondary | ICD-10-CM | POA: Diagnosis not present

## 2022-10-07 DIAGNOSIS — I110 Hypertensive heart disease with heart failure: Secondary | ICD-10-CM | POA: Diagnosis not present

## 2022-10-07 DIAGNOSIS — C787 Secondary malignant neoplasm of liver and intrahepatic bile duct: Secondary | ICD-10-CM | POA: Diagnosis not present

## 2022-10-10 DIAGNOSIS — I429 Cardiomyopathy, unspecified: Secondary | ICD-10-CM | POA: Diagnosis not present

## 2022-10-10 DIAGNOSIS — G4733 Obstructive sleep apnea (adult) (pediatric): Secondary | ICD-10-CM | POA: Diagnosis not present

## 2022-10-10 DIAGNOSIS — C259 Malignant neoplasm of pancreas, unspecified: Secondary | ICD-10-CM | POA: Diagnosis not present

## 2022-10-10 DIAGNOSIS — I5020 Unspecified systolic (congestive) heart failure: Secondary | ICD-10-CM | POA: Diagnosis not present

## 2022-10-10 DIAGNOSIS — C787 Secondary malignant neoplasm of liver and intrahepatic bile duct: Secondary | ICD-10-CM | POA: Diagnosis not present

## 2022-10-10 DIAGNOSIS — I110 Hypertensive heart disease with heart failure: Secondary | ICD-10-CM | POA: Diagnosis not present

## 2022-10-13 DIAGNOSIS — I429 Cardiomyopathy, unspecified: Secondary | ICD-10-CM | POA: Diagnosis not present

## 2022-10-13 DIAGNOSIS — C259 Malignant neoplasm of pancreas, unspecified: Secondary | ICD-10-CM | POA: Diagnosis not present

## 2022-10-13 DIAGNOSIS — I110 Hypertensive heart disease with heart failure: Secondary | ICD-10-CM | POA: Diagnosis not present

## 2022-10-13 DIAGNOSIS — I5020 Unspecified systolic (congestive) heart failure: Secondary | ICD-10-CM | POA: Diagnosis not present

## 2022-10-13 DIAGNOSIS — G4733 Obstructive sleep apnea (adult) (pediatric): Secondary | ICD-10-CM | POA: Diagnosis not present

## 2022-10-13 DIAGNOSIS — C787 Secondary malignant neoplasm of liver and intrahepatic bile duct: Secondary | ICD-10-CM | POA: Diagnosis not present

## 2022-10-14 DIAGNOSIS — Z6821 Body mass index (BMI) 21.0-21.9, adult: Secondary | ICD-10-CM | POA: Diagnosis not present

## 2022-10-14 DIAGNOSIS — I5020 Unspecified systolic (congestive) heart failure: Secondary | ICD-10-CM | POA: Diagnosis not present

## 2022-10-14 DIAGNOSIS — G4733 Obstructive sleep apnea (adult) (pediatric): Secondary | ICD-10-CM | POA: Diagnosis not present

## 2022-10-14 DIAGNOSIS — E039 Hypothyroidism, unspecified: Secondary | ICD-10-CM | POA: Diagnosis not present

## 2022-10-14 DIAGNOSIS — I429 Cardiomyopathy, unspecified: Secondary | ICD-10-CM | POA: Diagnosis not present

## 2022-10-14 DIAGNOSIS — D630 Anemia in neoplastic disease: Secondary | ICD-10-CM | POA: Diagnosis not present

## 2022-10-14 DIAGNOSIS — I110 Hypertensive heart disease with heart failure: Secondary | ICD-10-CM | POA: Diagnosis not present

## 2022-10-14 DIAGNOSIS — D696 Thrombocytopenia, unspecified: Secondary | ICD-10-CM | POA: Diagnosis not present

## 2022-10-14 DIAGNOSIS — M109 Gout, unspecified: Secondary | ICD-10-CM | POA: Diagnosis not present

## 2022-10-14 DIAGNOSIS — N400 Enlarged prostate without lower urinary tract symptoms: Secondary | ICD-10-CM | POA: Diagnosis not present

## 2022-10-14 DIAGNOSIS — M519 Unspecified thoracic, thoracolumbar and lumbosacral intervertebral disc disorder: Secondary | ICD-10-CM | POA: Diagnosis not present

## 2022-10-14 DIAGNOSIS — C787 Secondary malignant neoplasm of liver and intrahepatic bile duct: Secondary | ICD-10-CM | POA: Diagnosis not present

## 2022-10-14 DIAGNOSIS — B079 Viral wart, unspecified: Secondary | ICD-10-CM | POA: Diagnosis not present

## 2022-10-14 DIAGNOSIS — I82402 Acute embolism and thrombosis of unspecified deep veins of left lower extremity: Secondary | ICD-10-CM | POA: Diagnosis not present

## 2022-10-14 DIAGNOSIS — C259 Malignant neoplasm of pancreas, unspecified: Secondary | ICD-10-CM | POA: Diagnosis not present

## 2022-10-16 DIAGNOSIS — I5020 Unspecified systolic (congestive) heart failure: Secondary | ICD-10-CM | POA: Diagnosis not present

## 2022-10-16 DIAGNOSIS — I110 Hypertensive heart disease with heart failure: Secondary | ICD-10-CM | POA: Diagnosis not present

## 2022-10-16 DIAGNOSIS — I429 Cardiomyopathy, unspecified: Secondary | ICD-10-CM | POA: Diagnosis not present

## 2022-10-16 DIAGNOSIS — C259 Malignant neoplasm of pancreas, unspecified: Secondary | ICD-10-CM | POA: Diagnosis not present

## 2022-10-16 DIAGNOSIS — G4733 Obstructive sleep apnea (adult) (pediatric): Secondary | ICD-10-CM | POA: Diagnosis not present

## 2022-10-16 DIAGNOSIS — C787 Secondary malignant neoplasm of liver and intrahepatic bile duct: Secondary | ICD-10-CM | POA: Diagnosis not present

## 2022-10-18 DIAGNOSIS — G4733 Obstructive sleep apnea (adult) (pediatric): Secondary | ICD-10-CM | POA: Diagnosis not present

## 2022-10-18 DIAGNOSIS — C259 Malignant neoplasm of pancreas, unspecified: Secondary | ICD-10-CM | POA: Diagnosis not present

## 2022-10-18 DIAGNOSIS — I110 Hypertensive heart disease with heart failure: Secondary | ICD-10-CM | POA: Diagnosis not present

## 2022-10-18 DIAGNOSIS — C787 Secondary malignant neoplasm of liver and intrahepatic bile duct: Secondary | ICD-10-CM | POA: Diagnosis not present

## 2022-10-18 DIAGNOSIS — I5020 Unspecified systolic (congestive) heart failure: Secondary | ICD-10-CM | POA: Diagnosis not present

## 2022-10-18 DIAGNOSIS — I429 Cardiomyopathy, unspecified: Secondary | ICD-10-CM | POA: Diagnosis not present

## 2022-10-20 DIAGNOSIS — G4733 Obstructive sleep apnea (adult) (pediatric): Secondary | ICD-10-CM | POA: Diagnosis not present

## 2022-10-20 DIAGNOSIS — I429 Cardiomyopathy, unspecified: Secondary | ICD-10-CM | POA: Diagnosis not present

## 2022-10-20 DIAGNOSIS — C259 Malignant neoplasm of pancreas, unspecified: Secondary | ICD-10-CM | POA: Diagnosis not present

## 2022-10-20 DIAGNOSIS — I5020 Unspecified systolic (congestive) heart failure: Secondary | ICD-10-CM | POA: Diagnosis not present

## 2022-10-20 DIAGNOSIS — C787 Secondary malignant neoplasm of liver and intrahepatic bile duct: Secondary | ICD-10-CM | POA: Diagnosis not present

## 2022-10-20 DIAGNOSIS — I110 Hypertensive heart disease with heart failure: Secondary | ICD-10-CM | POA: Diagnosis not present

## 2022-10-27 DIAGNOSIS — C787 Secondary malignant neoplasm of liver and intrahepatic bile duct: Secondary | ICD-10-CM | POA: Diagnosis not present

## 2022-10-27 DIAGNOSIS — G4733 Obstructive sleep apnea (adult) (pediatric): Secondary | ICD-10-CM | POA: Diagnosis not present

## 2022-10-27 DIAGNOSIS — C259 Malignant neoplasm of pancreas, unspecified: Secondary | ICD-10-CM | POA: Diagnosis not present

## 2022-10-27 DIAGNOSIS — I110 Hypertensive heart disease with heart failure: Secondary | ICD-10-CM | POA: Diagnosis not present

## 2022-10-27 DIAGNOSIS — I429 Cardiomyopathy, unspecified: Secondary | ICD-10-CM | POA: Diagnosis not present

## 2022-10-27 DIAGNOSIS — I5020 Unspecified systolic (congestive) heart failure: Secondary | ICD-10-CM | POA: Diagnosis not present

## 2022-11-01 DIAGNOSIS — I5020 Unspecified systolic (congestive) heart failure: Secondary | ICD-10-CM | POA: Diagnosis not present

## 2022-11-01 DIAGNOSIS — C259 Malignant neoplasm of pancreas, unspecified: Secondary | ICD-10-CM | POA: Diagnosis not present

## 2022-11-01 DIAGNOSIS — G4733 Obstructive sleep apnea (adult) (pediatric): Secondary | ICD-10-CM | POA: Diagnosis not present

## 2022-11-01 DIAGNOSIS — I429 Cardiomyopathy, unspecified: Secondary | ICD-10-CM | POA: Diagnosis not present

## 2022-11-01 DIAGNOSIS — C787 Secondary malignant neoplasm of liver and intrahepatic bile duct: Secondary | ICD-10-CM | POA: Diagnosis not present

## 2022-11-01 DIAGNOSIS — I110 Hypertensive heart disease with heart failure: Secondary | ICD-10-CM | POA: Diagnosis not present

## 2022-11-03 DIAGNOSIS — C259 Malignant neoplasm of pancreas, unspecified: Secondary | ICD-10-CM | POA: Diagnosis not present

## 2022-11-03 DIAGNOSIS — I110 Hypertensive heart disease with heart failure: Secondary | ICD-10-CM | POA: Diagnosis not present

## 2022-11-03 DIAGNOSIS — I5020 Unspecified systolic (congestive) heart failure: Secondary | ICD-10-CM | POA: Diagnosis not present

## 2022-11-03 DIAGNOSIS — G4733 Obstructive sleep apnea (adult) (pediatric): Secondary | ICD-10-CM | POA: Diagnosis not present

## 2022-11-03 DIAGNOSIS — I429 Cardiomyopathy, unspecified: Secondary | ICD-10-CM | POA: Diagnosis not present

## 2022-11-03 DIAGNOSIS — C787 Secondary malignant neoplasm of liver and intrahepatic bile duct: Secondary | ICD-10-CM | POA: Diagnosis not present

## 2022-11-04 DIAGNOSIS — I429 Cardiomyopathy, unspecified: Secondary | ICD-10-CM | POA: Diagnosis not present

## 2022-11-04 DIAGNOSIS — C259 Malignant neoplasm of pancreas, unspecified: Secondary | ICD-10-CM | POA: Diagnosis not present

## 2022-11-04 DIAGNOSIS — I5020 Unspecified systolic (congestive) heart failure: Secondary | ICD-10-CM | POA: Diagnosis not present

## 2022-11-04 DIAGNOSIS — I110 Hypertensive heart disease with heart failure: Secondary | ICD-10-CM | POA: Diagnosis not present

## 2022-11-04 DIAGNOSIS — C787 Secondary malignant neoplasm of liver and intrahepatic bile duct: Secondary | ICD-10-CM | POA: Diagnosis not present

## 2022-11-04 DIAGNOSIS — G4733 Obstructive sleep apnea (adult) (pediatric): Secondary | ICD-10-CM | POA: Diagnosis not present

## 2022-11-06 DIAGNOSIS — C787 Secondary malignant neoplasm of liver and intrahepatic bile duct: Secondary | ICD-10-CM | POA: Diagnosis not present

## 2022-11-06 DIAGNOSIS — I429 Cardiomyopathy, unspecified: Secondary | ICD-10-CM | POA: Diagnosis not present

## 2022-11-06 DIAGNOSIS — C259 Malignant neoplasm of pancreas, unspecified: Secondary | ICD-10-CM | POA: Diagnosis not present

## 2022-11-06 DIAGNOSIS — I110 Hypertensive heart disease with heart failure: Secondary | ICD-10-CM | POA: Diagnosis not present

## 2022-11-06 DIAGNOSIS — G4733 Obstructive sleep apnea (adult) (pediatric): Secondary | ICD-10-CM | POA: Diagnosis not present

## 2022-11-06 DIAGNOSIS — I5020 Unspecified systolic (congestive) heart failure: Secondary | ICD-10-CM | POA: Diagnosis not present

## 2022-11-07 DIAGNOSIS — I110 Hypertensive heart disease with heart failure: Secondary | ICD-10-CM | POA: Diagnosis not present

## 2022-11-07 DIAGNOSIS — C787 Secondary malignant neoplasm of liver and intrahepatic bile duct: Secondary | ICD-10-CM | POA: Diagnosis not present

## 2022-11-07 DIAGNOSIS — C259 Malignant neoplasm of pancreas, unspecified: Secondary | ICD-10-CM | POA: Diagnosis not present

## 2022-11-07 DIAGNOSIS — I429 Cardiomyopathy, unspecified: Secondary | ICD-10-CM | POA: Diagnosis not present

## 2022-11-07 DIAGNOSIS — I5020 Unspecified systolic (congestive) heart failure: Secondary | ICD-10-CM | POA: Diagnosis not present

## 2022-11-07 DIAGNOSIS — G4733 Obstructive sleep apnea (adult) (pediatric): Secondary | ICD-10-CM | POA: Diagnosis not present

## 2022-11-09 DIAGNOSIS — I5020 Unspecified systolic (congestive) heart failure: Secondary | ICD-10-CM | POA: Diagnosis not present

## 2022-11-09 DIAGNOSIS — C787 Secondary malignant neoplasm of liver and intrahepatic bile duct: Secondary | ICD-10-CM | POA: Diagnosis not present

## 2022-11-09 DIAGNOSIS — I429 Cardiomyopathy, unspecified: Secondary | ICD-10-CM | POA: Diagnosis not present

## 2022-11-09 DIAGNOSIS — I110 Hypertensive heart disease with heart failure: Secondary | ICD-10-CM | POA: Diagnosis not present

## 2022-11-09 DIAGNOSIS — G4733 Obstructive sleep apnea (adult) (pediatric): Secondary | ICD-10-CM | POA: Diagnosis not present

## 2022-11-09 DIAGNOSIS — C259 Malignant neoplasm of pancreas, unspecified: Secondary | ICD-10-CM | POA: Diagnosis not present

## 2022-11-10 DIAGNOSIS — C259 Malignant neoplasm of pancreas, unspecified: Secondary | ICD-10-CM | POA: Diagnosis not present

## 2022-11-10 DIAGNOSIS — I5020 Unspecified systolic (congestive) heart failure: Secondary | ICD-10-CM | POA: Diagnosis not present

## 2022-11-10 DIAGNOSIS — I110 Hypertensive heart disease with heart failure: Secondary | ICD-10-CM | POA: Diagnosis not present

## 2022-11-10 DIAGNOSIS — G4733 Obstructive sleep apnea (adult) (pediatric): Secondary | ICD-10-CM | POA: Diagnosis not present

## 2022-11-10 DIAGNOSIS — C787 Secondary malignant neoplasm of liver and intrahepatic bile duct: Secondary | ICD-10-CM | POA: Diagnosis not present

## 2022-11-10 DIAGNOSIS — I429 Cardiomyopathy, unspecified: Secondary | ICD-10-CM | POA: Diagnosis not present

## 2022-11-11 DIAGNOSIS — G4733 Obstructive sleep apnea (adult) (pediatric): Secondary | ICD-10-CM | POA: Diagnosis not present

## 2022-11-11 DIAGNOSIS — I429 Cardiomyopathy, unspecified: Secondary | ICD-10-CM | POA: Diagnosis not present

## 2022-11-11 DIAGNOSIS — C787 Secondary malignant neoplasm of liver and intrahepatic bile duct: Secondary | ICD-10-CM | POA: Diagnosis not present

## 2022-11-11 DIAGNOSIS — I110 Hypertensive heart disease with heart failure: Secondary | ICD-10-CM | POA: Diagnosis not present

## 2022-11-11 DIAGNOSIS — C259 Malignant neoplasm of pancreas, unspecified: Secondary | ICD-10-CM | POA: Diagnosis not present

## 2022-11-11 DIAGNOSIS — I5020 Unspecified systolic (congestive) heart failure: Secondary | ICD-10-CM | POA: Diagnosis not present

## 2023-08-15 DEATH — deceased

## 2024-12-17 ENCOUNTER — Other Ambulatory Visit: Payer: Self-pay
